# Patient Record
Sex: Female | Born: 1970 | Race: Black or African American | Hispanic: No | Marital: Single | Smoking: Current every day smoker
Health system: Southern US, Community
[De-identification: ages and names within clinical notes are randomized; demographics above are authoritative.]

---

## 2006-02-19 ENCOUNTER — Encounter (INDEPENDENT_AMBULATORY_CARE_PROVIDER_SITE_OTHER): Payer: Self-pay | Admitting: Physician Assistant

## 2006-02-20 ENCOUNTER — Encounter: Payer: Self-pay | Admitting: Physician Assistant

## 2006-02-27 ENCOUNTER — Encounter: Payer: Self-pay | Admitting: Physician Assistant

## 2006-02-28 ENCOUNTER — Encounter (INDEPENDENT_AMBULATORY_CARE_PROVIDER_SITE_OTHER): Payer: Self-pay | Admitting: Physician Assistant

## 2006-03-09 ENCOUNTER — Encounter (INDEPENDENT_AMBULATORY_CARE_PROVIDER_SITE_OTHER): Payer: Self-pay | Admitting: Physician Assistant

## 2006-03-21 ENCOUNTER — Encounter (INDEPENDENT_AMBULATORY_CARE_PROVIDER_SITE_OTHER): Payer: Self-pay | Admitting: Physician Assistant

## 2006-04-06 ENCOUNTER — Encounter: Payer: Self-pay | Admitting: Physician Assistant

## 2006-04-17 ENCOUNTER — Encounter: Payer: Self-pay | Admitting: Physician Assistant

## 2006-10-15 ENCOUNTER — Encounter (INDEPENDENT_AMBULATORY_CARE_PROVIDER_SITE_OTHER): Payer: Self-pay | Admitting: Physician Assistant

## 2006-11-21 ENCOUNTER — Encounter: Payer: Self-pay | Admitting: Physician Assistant

## 2006-11-22 ENCOUNTER — Encounter: Payer: Self-pay | Admitting: Physician Assistant

## 2007-03-27 ENCOUNTER — Encounter: Payer: Self-pay | Admitting: Physician Assistant

## 2007-04-18 ENCOUNTER — Encounter (INDEPENDENT_AMBULATORY_CARE_PROVIDER_SITE_OTHER): Payer: Self-pay | Admitting: Physician Assistant

## 2009-08-08 ENCOUNTER — Encounter (INDEPENDENT_AMBULATORY_CARE_PROVIDER_SITE_OTHER): Payer: Self-pay | Admitting: Physician Assistant

## 2010-06-13 ENCOUNTER — Ambulatory Visit: Payer: Self-pay | Admitting: Gastroenterology

## 2010-06-13 ENCOUNTER — Inpatient Hospital Stay (HOSPITAL_COMMUNITY): Admission: EM | Admit: 2010-06-13 | Discharge: 2010-06-17 | Payer: Self-pay | Admitting: Emergency Medicine

## 2010-06-16 ENCOUNTER — Encounter: Payer: Self-pay | Admitting: Gastroenterology

## 2010-07-04 ENCOUNTER — Ambulatory Visit: Payer: Self-pay | Admitting: Gastroenterology

## 2010-07-04 DIAGNOSIS — K5090 Crohn's disease, unspecified, without complications: Secondary | ICD-10-CM | POA: Insufficient documentation

## 2010-07-04 DIAGNOSIS — D509 Iron deficiency anemia, unspecified: Secondary | ICD-10-CM

## 2010-07-25 ENCOUNTER — Ambulatory Visit: Payer: Self-pay | Admitting: Gastroenterology

## 2010-09-13 NOTE — Letter (Signed)
Summary: New Patient letter  [facility]  [address] [place], [place] [address]   Phone: [phone]  Fax: [phone]       06/16/2010 MRN: [id number]  [facility] [address] [place], [place]  [address]  Dear Tara Berger,  Welcome to the Gastroenterology Division at [facility].    You are scheduled to see Dr.  Arlyce Dice  on 07/04/10 at 11:15 on the 3rd floor at [facility], [address]  We ask that you try to arrive at our office 15 minutes prior to your appointment time to allow for check-in.  We would like you to complete the enclosed self-administered evaluation form prior to your visit and bring it with you on the day of your appointment.  We will review it with you.  Also, please bring a complete list of all your medications or, if you prefer, bring the medication bottles and we will list them.  Please bring your insurance card so that we may make a copy of it.  If your insurance requires a referral to see a specialist, please bring your referral form from your primary care physician.  Co-payments are due at the time of your visit and may be paid by cash, check or credit card.     Your office visit will consist of a consult with your physician (includes a physical exam), any laboratory testing he/she may order, scheduling of any necessary diagnostic testing (e.g. x-ray, ultrasound, CT-scan), and scheduling of a procedure (e.g. Endoscopy, Colonoscopy) if required.  Please allow enough time on your schedule to allow for any/all of these possibilities.    If you cannot keep your appointment, please call [phone] to cancel or reschedule prior to your appointment date.  This allows [place] the opportunity to schedule an appointment for another patient in need of care.  If you do not cancel or reschedule by 5 p.m. the business day prior to your appointment date, you will be charged a $50.00 late cancellation/no-show fee.    Thank you for  choosing [facility] for your medical needs.  We appreciate the opportunity to care for you.  Please visit [place] at our website  to learn more about our practice.                     Sincerely,                                                             The Gastroenterology Division

## 2010-09-13 NOTE — Letter (Signed)
Summary: Epimenio Foot PA-C  Epimenio Foot PA-C   Imported By: Lester Carmichael 06/23/2010 08:40:44  _____________________________________________________________________  External Attachment:    Type:   Image     Comment:   External Document

## 2010-09-13 NOTE — Letter (Signed)
Summary: [facility]  [facility]   Imported By: Lester McCord 06/23/2010 08:37:30  _____________________________________________________________________  External Attachment:    Type:   Image     Comment:   External Document

## 2010-09-13 NOTE — Op Note (Signed)
Summary: [facility]   Imported By: Lester Cleary 06/23/2010 09:02:38  _____________________________________________________________________  External Attachment:    Type:   Image     Comment:   External Document

## 2010-09-13 NOTE — Letter (Signed)
Summary: Epimenio Foot  PA-C  Epimenio Foot  PA-C   Imported By: Lester Gueydan 06/23/2010 08:39:12  _____________________________________________________________________  External Attachment:    Type:   Image     Comment:   External Document

## 2010-09-13 NOTE — Letter (Signed)
Summary: [facility]  [facility]   Imported By: Lester [place] 06/23/2010 08:43:26  _____________________________________________________________________  External Attachment:    Type:   Image     Comment:   External Document

## 2010-09-13 NOTE — Letter (Signed)
Summary: [facility]   Imported By: Lester [place] 06/23/2010 08:34:04  _____________________________________________________________________  External Attachment:    Type:   Image     Comment:   External Document

## 2010-09-13 NOTE — Letter (Signed)
Summary: [facility]   Imported By: Lester Tich[place] 06/23/2010 08:32:41  _____________________________________________________________________  External Attachment:    Type:   Image     Comment:   External Document

## 2010-09-13 NOTE — Letter (Signed)
Summary: [facility]  [facility]   Imported By: Lester [place] 06/23/2010 08:42:08  _____________________________________________________________________  External Attachment:    Type:   Image     Comment:   External Document

## 2010-09-13 NOTE — Letter (Signed)
Summary: [facility]  [facility]   Imported By: Lester Neola 06/23/2010 08:44:52  _____________________________________________________________________  External Attachment:    Type:   Image     Comment:   External Document

## 2010-09-15 NOTE — Assessment & Plan Note (Signed)
Summary: Post hospital crohn's disease/sheri   History of Present Illness Visit Type: Initial Visit Primary GI MD: Melvia Heaps MD [facility] Primary Provider: Della Goo, MD Chief Complaint: Patient complains of bloating and constipated. She is post hospital for anemia but here to follow up on her Crohns also. She is also still having very heavy bleeding from her menstrual cycle.   History of Present Illness:   Ms. Rockhold is a 40 year old Afro-American female referred from the hospital following a hospitalization for a  UTI.  She has a history of Crohn's disease and is status post resection of her colon when  she was in [place].  She has been on steroids in the past as well as mesalamine.  Compliance apparently has been an issue.  She was hospitalized for a  UTI and abdominal pain.  She was started on prednisone and Pentasa.  She currently complains only of constipation.  She suffers from menorrhagia and has a history of an iron deficiency anemia.  Further details of her hospitalization are not known.   GI Review of Systems    Reports abdominal pain and  bloating.     Location of  Abdominal pain: generalized.    Denies acid reflux, belching, chest pain, dysphagia with liquids, dysphagia with solids, heartburn, loss of appetite, nausea, vomiting, vomiting blood, weight loss, and  weight gain.      Reports constipation.     Denies anal fissure, black tarry stools, change in bowel habit, diarrhea, diverticulosis, fecal incontinence, heme positive stool, hemorrhoids, irritable bowel syndrome, jaundice, light color stool, liver problems, rectal bleeding, and  rectal pain. Preventive Screening-Counseling & Management  Alcohol-Tobacco     Smoking Status: current    Current Medications (verified): 1)  Cyanocobalamin 1000 Mcg/ml Soln (Cyanocobalamin) .... Once A Month Injection 2)  Colace 100 Mg Caps (Docusate Sodium) .... 2 By Mouth At Bedtime 3)  Ferrous Gluconate 240 (27 Fe) Mg Tabs (Ferrous  Gluconate) .Marland Kitchen.. 1 By Mouth Three Times A Day 4)  Pentasa 250 Mg Cr-Caps (Mesalamine) .... 4 Caps By Mouth Four Times Daily 5)  Zofran 4 Mg Tabs (Ondansetron Hcl) .Marland Kitchen.. 1 By Mouth Every 6 Hours As Needed 6)  Prednisone 10 Mg Tabs (Prednisone) .... 3 Tablets By Mouth Once Daily 7)  Tylenol With Codeine #3 300-30 Mg Tabs (Acetaminophen-Codeine) .Marland Kitchen.. 1 By Mouth Every 6 Hours As Needed 8)  Ambien 5 Mg Tabs (Zolpidem Tartrate) .Marland Kitchen.. 1 By Mouth As Needed (Out)  Allergies (verified): 1)  ! * Latex  Past History:  Past Medical History: Reviewed history from 06/29/2010 and no changes required. Anemia Crohns Disease Vitamin B12 Def Hx of Marijuana use  Past Surgical History: Colon resection 2007 left tibula surgery 2008  Family History: No FH of Colon Cancer: Family History of Diabetes: father, brother  Social History: Patient currently smokes.  1/2 ppd Alcohol Use - yes socially Smoking Status:  current  Review of Systems       The patient complains of anemia, arthritis/joint pain, and fatigue.  The patient denies allergy/sinus, anxiety-new, back pain, blood in urine, breast changes/lumps, change in vision, confusion, cough, coughing up blood, depression-new, fainting, fever, headaches-new, hearing problems, heart murmur, heart rhythm changes, itching, menstrual pain, muscle pains/cramps, night sweats, nosebleeds, pregnancy symptoms, shortness of breath, skin rash, sleeping problems, sore throat, swelling of feet/legs, swollen lymph glands, thirst - excessive , urination - excessive , urination changes/pain, urine leakage, vision changes, and voice change.         All  other systems were reviewed and were negative   Vital Signs:  Patient profile:   40 year old female Height:      62 inches Weight:      138.4 pounds BMI:     25.41 Pulse rate:   88 / minute Pulse rhythm:   regular BP sitting:   130 / 82  (left arm) Cuff size:   regular  Vitals Entered By: Harlow Mares CMA Duncan Dull)  (July 04, 2010 11:21 AM)  Physical Exam  Additional Exam:  On physical exam she is a thin female  skin: anicteric HEENT: normocephalic; PEERLA; no nasal or pharyngeal abnormalities neck: supple nodes: no cervical lymphadenopathy chest: clear to ausculatation and percussion heart: no murmurs, gallops, or rubs abd: soft, nontender; BS normoactive; no abdominal masses, tenderness, organomegaly rectal: deferred ext: no cynanosis, clubbing, edema skeletal: no deformities neuro: oriented x 3; no focal abnormalities    Impression & Recommendations:  Problem # 1:  CROHN'S DISEASE (ICD-555.9) She is currently asymptomatic on prednisone and Pentasa.  Details of her Crohn's disease are not known.  Recommendations #1 taper prednisone;  continue mesalamine #2 review old records  Problem # 2:  IRON DEFICIENCY (ICD-280.9) This is most likely due to menorrhagia.  Medications #1 continue iron supplementation #2 patient is scheduled for GYN followup  Patient Instructions: 1)  Copy sent to : Della Goo, MD 2)  Prednisone Taper: 3)  Lower to 20 mg daily for 1 week 4)  then to 15mg  daily for 1 week  5)  then 10mg  weekly thereafter 6)  We will refill your Pentasa today 7)  The medication list was reviewed and reconciled.  All changed / newly prescribed medications were explained.  A complete medication list was provided to the patient / caregiver. Prescriptions: PENTASA 250 MG CR-CAPS (MESALAMINE) 4 caps by mouth four times daily  #510 x 3   Entered by:   Merri Ray CMA (AAMA)   Authorized by:   Louis Meckel MD   Signed by:   Merri Ray CMA (AAMA) on 07/04/2010   Method used:   Electronically to        [facility] [phone]* (retail)       [address]       [place], [place]  [address]       Ph: [phone]       Fax: [phone]   RxID:   [id number] PREDNISONE 10 MG TABS (PREDNISONE) As Directed  #35 x 0   Entered by:   Merri Ray CMA  (AAMA)   Authorized by:   Louis Meckel MD   Signed by:   Merri Ray CMA (AAMA) on 07/04/2010   Method used:   Electronically to        [facility] [phone]* (retail)       [address]       [place], [place]  [address]       Ph: [phone]       Fax: [phone]   RxID:   [id number]

## 2010-10-25 LAB — CBC
HCT: 22.8 % — ABNORMAL LOW (ref 36.0–46.0)
Hemoglobin: 6.1 g/dL — CL (ref 12.0–15.0)
MCH: 21.7 pg — ABNORMAL LOW (ref 26.0–34.0)
MCHC: 32 g/dL (ref 30.0–36.0)
MCV: 71.2 fL — ABNORMAL LOW (ref 78.0–100.0)
MCV: 73.1 fL — ABNORMAL LOW (ref 78.0–100.0)
Platelets: 321 10*3/uL (ref 150–400)
RBC: 2.81 MIL/uL — ABNORMAL LOW (ref 3.87–5.11)
RDW: 17.4 % — ABNORMAL HIGH (ref 11.5–15.5)

## 2010-10-25 LAB — BASIC METABOLIC PANEL
CO2: 23 mEq/L (ref 19–32)
Chloride: 109 mEq/L (ref 96–112)
Creatinine, Ser: 0.61 mg/dL (ref 0.4–1.2)
GFR calc Af Amer: >60 mL/min (ref >60)

## 2010-10-25 LAB — HEMOGLOBIN AND HEMATOCRIT, BLOOD: HCT: 23.9 % — ABNORMAL LOW (ref 36.0–46.0)

## 2010-10-25 LAB — TYPE AND SCREEN: Unit division: 0

## 2010-10-25 LAB — IRON AND TIBC: UIBC: 180 ug/dL

## 2010-10-25 LAB — FERRITIN: Ferritin: 10 ng/mL (ref 10–291)

## 2010-10-25 LAB — FOLATE: Folate: 7.8 ng/mL

## 2010-10-25 LAB — ABO/RH: ABO/RH(D): B POS

## 2010-10-25 LAB — RETICULOCYTES
RBC.: 2.91 MIL/uL — ABNORMAL LOW (ref 3.87–5.11)
Retic Ct Pct: 1.5 % (ref 0.4–3.1)

## 2010-10-26 LAB — DIFFERENTIAL
Basophils Absolute: 0 10*3/uL (ref 0.0–0.1)
Basophils Relative: 0 % (ref 0–1)
Eosinophils Absolute: 0.2 10*3/uL (ref 0.0–0.7)
Eosinophils Relative: 3 % (ref 0–5)
Lymphs Abs: 1.6 10*3/uL (ref 0.7–4.0)
Neutrophils Relative %: 67 % (ref 43–77)

## 2010-10-26 LAB — CBC
HCT: 23.7 % — ABNORMAL LOW (ref 36.0–46.0)
Hemoglobin: 7.3 g/dL — ABNORMAL LOW (ref 12.0–15.0)
MCH: 22.1 pg — ABNORMAL LOW (ref 26.0–34.0)
MCHC: 30.8 g/dL (ref 30.0–36.0)
RBC: 3.31 MIL/uL — ABNORMAL LOW (ref 3.87–5.11)

## 2010-10-26 LAB — URINALYSIS, ROUTINE W REFLEX MICROSCOPIC
Nitrite: NEGATIVE
Protein, ur: NEGATIVE mg/dL
Specific Gravity, Urine: 1.012 (ref 1.005–1.030)
Urobilinogen, UA: 1 mg/dL (ref 0.0–1.0)

## 2010-10-26 LAB — CULTURE, BLOOD (ROUTINE X 2)
Culture  Setup Time: 201111010355
Culture  Setup Time: 201111010355

## 2010-10-26 LAB — COMPREHENSIVE METABOLIC PANEL
ALT: <8 U/L (ref 0–35)
CO2: 23 mEq/L (ref 19–32)
Calcium: 8.7 mg/dL (ref 8.4–10.5)
Chloride: 103 mEq/L (ref 96–112)
GFR calc non Af Amer: >60 mL/min (ref >60)
Glucose, Bld: 94 mg/dL (ref 70–99)
Sodium: 133 mEq/L — ABNORMAL LOW (ref 135–145)
Total Bilirubin: 0.4 mg/dL (ref 0.3–1.2)

## 2010-10-26 LAB — LIPASE, BLOOD: Lipase: 22 U/L (ref 11–59)

## 2010-10-26 LAB — URINE MICROSCOPIC-ADD ON

## 2010-10-26 LAB — URINE CULTURE
Colony Count: >=100000
Culture  Setup Time: 201111010023

## 2011-08-10 IMAGING — CR DG ABDOMEN ACUTE W/ 1V CHEST
3 series · 3 of 3 positions shown · non-contrast
Comparison: None

CLINICAL DATA: Abdominal pain.  Crohns disease with colon resection
0228.

ACUTE ABDOMEN SERIES (ABDOMEN 2 VIEW & CHEST 1 VIEW)

[w chest pa]
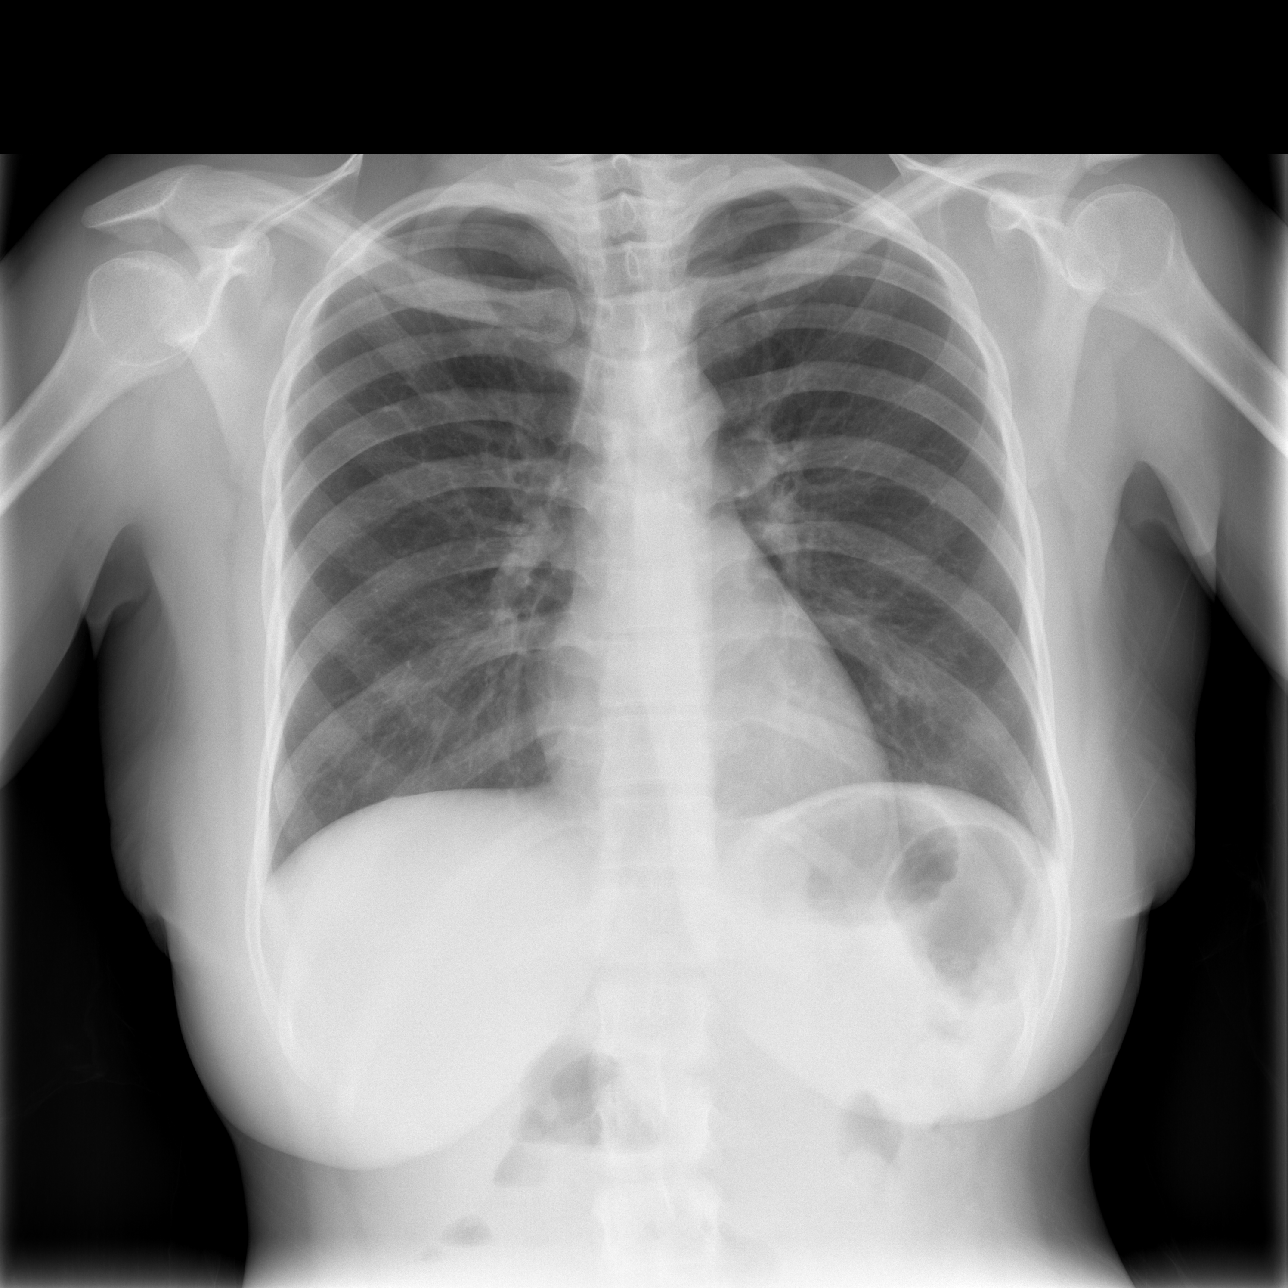

[w abdomen upright]
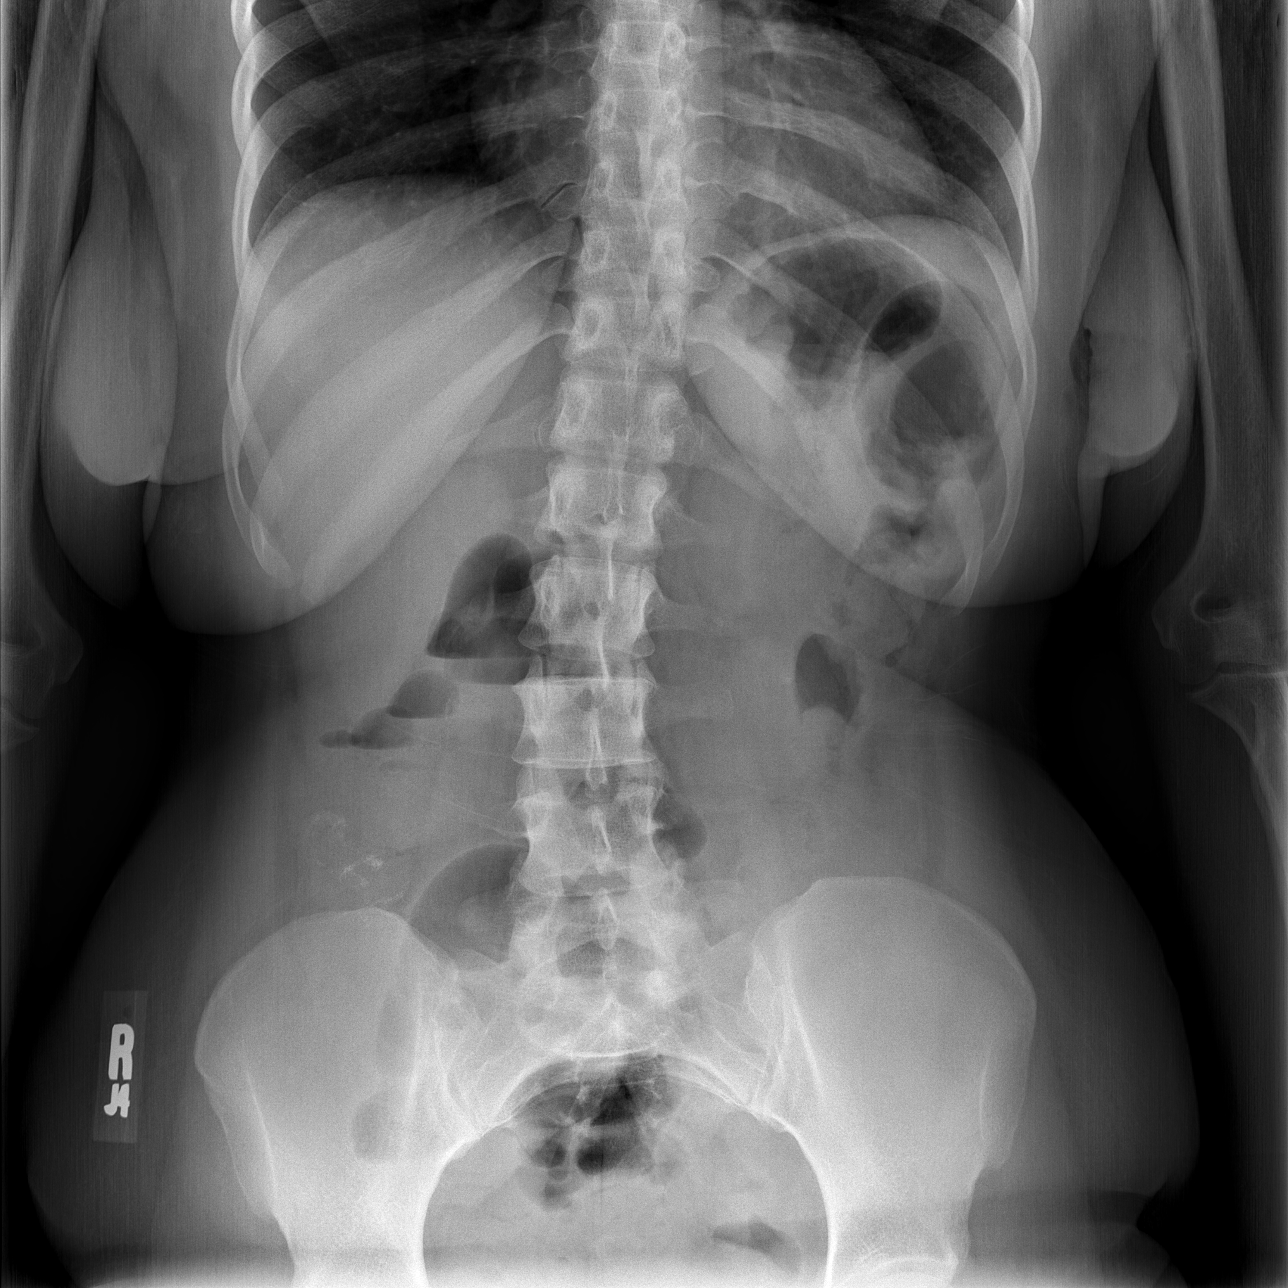

[t abdomen supine]
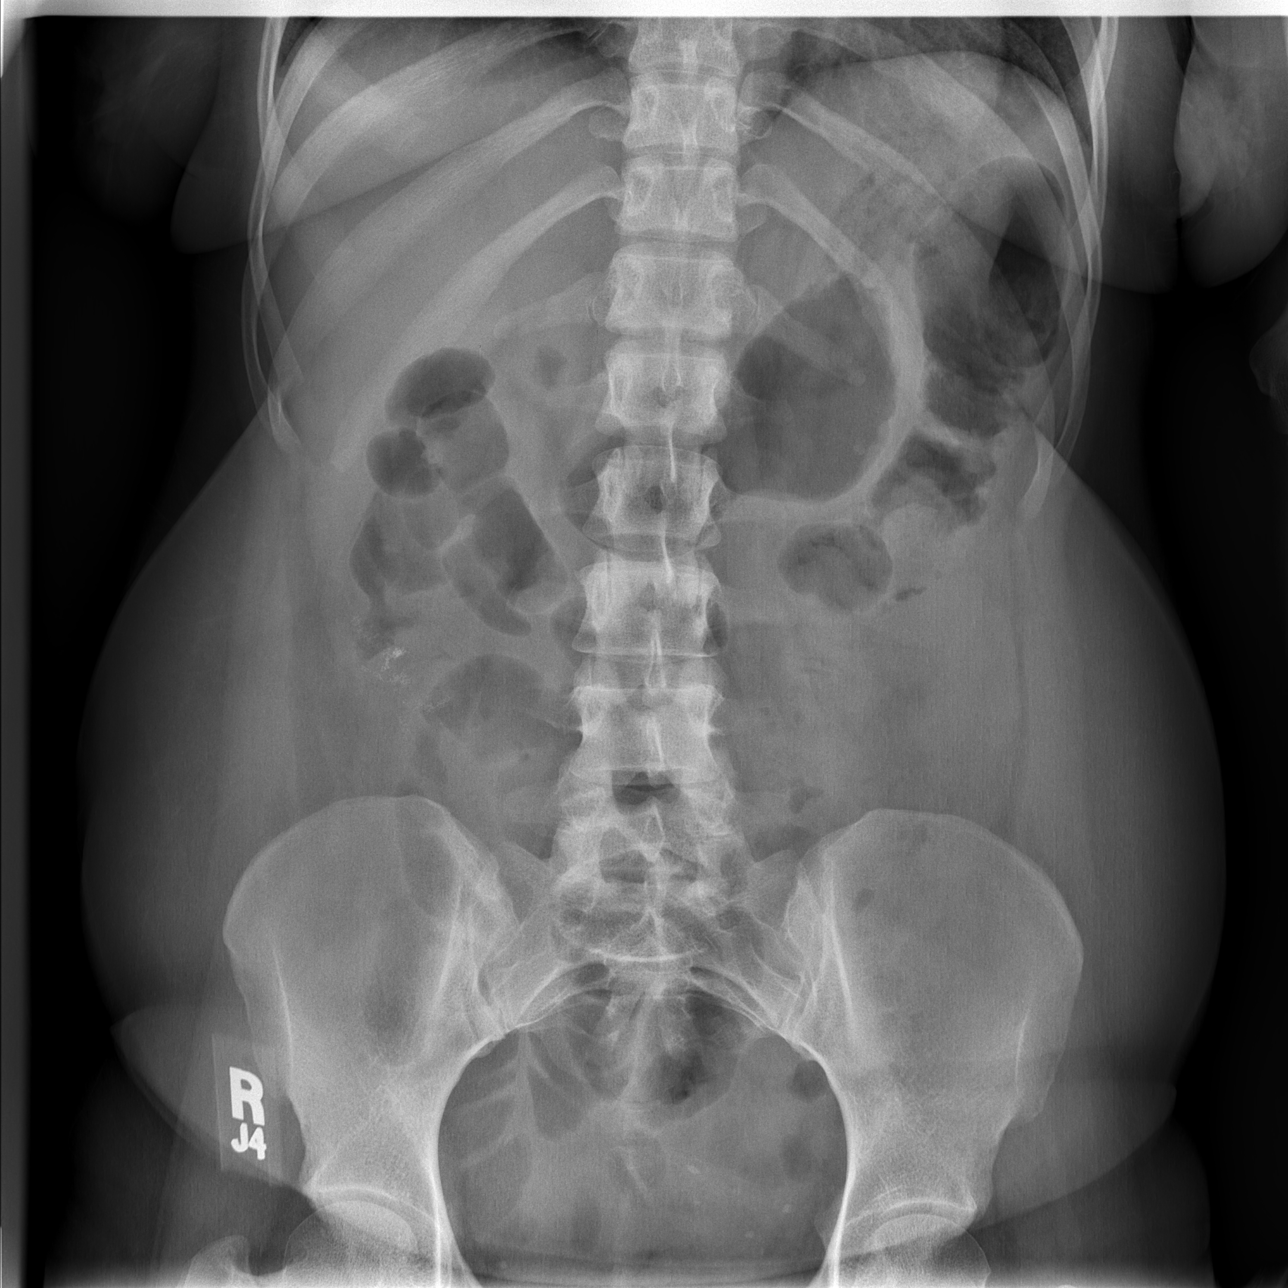

[3 of 3 positions shown; findings below may reference images not displayed]

FINDINGS: Heart size is normal and the vascularity is normal.
Lungs are clear without infiltrate or effusion.

Surgical clips are present in the right abdomen from prior bowel
surgery.  Negative for bowel obstruction.  Air fluid levels are
present in the right colon without bowel thickening.  Negative for
free intraperitoneal air.  No renal calculi or bony abnormality.
IMPRESSION: No acute abnormality the chest.

Air-fluid levels in the right colon without bowel dilatation.

## 2018-11-27 DIAGNOSIS — F411 Generalized anxiety disorder: Secondary | ICD-10-CM | POA: Insufficient documentation

## 2018-11-27 DIAGNOSIS — F17200 Nicotine dependence, unspecified, uncomplicated: Secondary | ICD-10-CM | POA: Diagnosis present

## 2021-01-04 DIAGNOSIS — I10 Essential (primary) hypertension: Secondary | ICD-10-CM | POA: Diagnosis present

## 2022-07-25 DIAGNOSIS — I050 Rheumatic mitral stenosis: Secondary | ICD-10-CM | POA: Insufficient documentation

## 2022-07-26 DIAGNOSIS — K50819 Crohn's disease of both small and large intestine with unspecified complications: Secondary | ICD-10-CM | POA: Insufficient documentation

## 2022-07-26 DIAGNOSIS — G8194 Hemiplegia, unspecified affecting left nondominant side: Secondary | ICD-10-CM | POA: Diagnosis present

## 2022-10-30 DIAGNOSIS — I6340 Cerebral infarction due to embolism of unspecified cerebral artery: Secondary | ICD-10-CM | POA: Insufficient documentation

## 2022-10-30 DIAGNOSIS — Z7409 Other reduced mobility: Secondary | ICD-10-CM | POA: Insufficient documentation

## 2022-11-11 DIAGNOSIS — E162 Hypoglycemia, unspecified: Secondary | ICD-10-CM | POA: Insufficient documentation

## 2022-11-11 DIAGNOSIS — N3000 Acute cystitis without hematuria: Secondary | ICD-10-CM | POA: Insufficient documentation

## 2022-11-11 DIAGNOSIS — I824Y2 Acute embolism and thrombosis of unspecified deep veins of left proximal lower extremity: Secondary | ICD-10-CM | POA: Insufficient documentation

## 2022-11-11 DIAGNOSIS — Z86711 Personal history of pulmonary embolism: Secondary | ICD-10-CM | POA: Diagnosis present

## 2022-11-11 DIAGNOSIS — I2699 Other pulmonary embolism without acute cor pulmonale: Secondary | ICD-10-CM | POA: Diagnosis present

## 2022-12-08 DIAGNOSIS — N764 Abscess of vulva: Secondary | ICD-10-CM | POA: Insufficient documentation

## 2022-12-08 DIAGNOSIS — D696 Thrombocytopenia, unspecified: Secondary | ICD-10-CM | POA: Insufficient documentation

## 2022-12-08 DIAGNOSIS — J159 Unspecified bacterial pneumonia: Secondary | ICD-10-CM | POA: Insufficient documentation

## 2022-12-08 DIAGNOSIS — B9620 Unspecified Escherichia coli [E. coli] as the cause of diseases classified elsewhere: Secondary | ICD-10-CM | POA: Insufficient documentation

## 2022-12-08 DIAGNOSIS — S0230XA Fracture of orbital floor, unspecified side, initial encounter for closed fracture: Secondary | ICD-10-CM | POA: Insufficient documentation

## 2022-12-08 DIAGNOSIS — F17210 Nicotine dependence, cigarettes, uncomplicated: Secondary | ICD-10-CM | POA: Insufficient documentation

## 2022-12-08 DIAGNOSIS — A4151 Sepsis due to Escherichia coli [E. coli]: Secondary | ICD-10-CM | POA: Insufficient documentation

## 2022-12-08 DIAGNOSIS — E861 Hypovolemia: Secondary | ICD-10-CM | POA: Insufficient documentation

## 2022-12-08 DIAGNOSIS — G4700 Insomnia, unspecified: Secondary | ICD-10-CM | POA: Insufficient documentation

## 2022-12-08 DIAGNOSIS — N390 Urinary tract infection, site not specified: Secondary | ICD-10-CM | POA: Insufficient documentation

## 2023-01-24 ENCOUNTER — Inpatient Hospital Stay (HOSPITAL_COMMUNITY)
Admission: EM | Admit: 2023-01-24 | Discharge: 2023-02-07 | DRG: 329 | Disposition: A | Discharge status: Home Health | Payer: Medicaid Other | Attending: Student in an Organized Health Care Education/Training Program | Admitting: Student in an Organized Health Care Education/Training Program

## 2023-01-24 ENCOUNTER — Encounter (HOSPITAL_COMMUNITY): Payer: Self-pay

## 2023-01-24 ENCOUNTER — Inpatient Hospital Stay (HOSPITAL_COMMUNITY): Payer: Medicaid Other

## 2023-01-24 ENCOUNTER — Other Ambulatory Visit: Payer: Self-pay

## 2023-01-24 ENCOUNTER — Emergency Department (HOSPITAL_COMMUNITY): Payer: Medicaid Other

## 2023-01-24 DIAGNOSIS — K56609 Unspecified intestinal obstruction, unspecified as to partial versus complete obstruction: Secondary | ICD-10-CM | POA: Diagnosis not present

## 2023-01-24 DIAGNOSIS — Z8701 Personal history of pneumonia (recurrent): Secondary | ICD-10-CM

## 2023-01-24 DIAGNOSIS — F17210 Nicotine dependence, cigarettes, uncomplicated: Secondary | ICD-10-CM | POA: Diagnosis present

## 2023-01-24 DIAGNOSIS — E162 Hypoglycemia, unspecified: Secondary | ICD-10-CM | POA: Diagnosis not present

## 2023-01-24 DIAGNOSIS — K9189 Other postprocedural complications and disorders of digestive system: Principal | ICD-10-CM | POA: Diagnosis present

## 2023-01-24 DIAGNOSIS — G8194 Hemiplegia, unspecified affecting left nondominant side: Secondary | ICD-10-CM | POA: Diagnosis present

## 2023-01-24 DIAGNOSIS — Z7901 Long term (current) use of anticoagulants: Secondary | ICD-10-CM | POA: Diagnosis not present

## 2023-01-24 DIAGNOSIS — Z91199 Patient's noncompliance with other medical treatment and regimen due to unspecified reason: Secondary | ICD-10-CM | POA: Diagnosis not present

## 2023-01-24 DIAGNOSIS — R6510 Systemic inflammatory response syndrome (SIRS) of non-infectious origin without acute organ dysfunction: Secondary | ICD-10-CM | POA: Diagnosis not present

## 2023-01-24 DIAGNOSIS — T45516A Underdosing of anticoagulants, initial encounter: Secondary | ICD-10-CM | POA: Diagnosis present

## 2023-01-24 DIAGNOSIS — K565 Intestinal adhesions [bands], unspecified as to partial versus complete obstruction: Secondary | ICD-10-CM | POA: Diagnosis not present

## 2023-01-24 DIAGNOSIS — K660 Peritoneal adhesions (postprocedural) (postinfection): Secondary | ICD-10-CM | POA: Diagnosis present

## 2023-01-24 DIAGNOSIS — I10 Essential (primary) hypertension: Secondary | ICD-10-CM | POA: Diagnosis present

## 2023-01-24 DIAGNOSIS — E876 Hypokalemia: Secondary | ICD-10-CM | POA: Diagnosis present

## 2023-01-24 DIAGNOSIS — E860 Dehydration: Secondary | ICD-10-CM | POA: Diagnosis present

## 2023-01-24 DIAGNOSIS — K5090 Crohn's disease, unspecified, without complications: Secondary | ICD-10-CM | POA: Diagnosis present

## 2023-01-24 DIAGNOSIS — K9132 Postprocedural complete intestinal obstruction: Secondary | ICD-10-CM | POA: Diagnosis present

## 2023-01-24 DIAGNOSIS — E440 Moderate protein-calorie malnutrition: Secondary | ICD-10-CM | POA: Diagnosis present

## 2023-01-24 DIAGNOSIS — D649 Anemia, unspecified: Secondary | ICD-10-CM | POA: Diagnosis not present

## 2023-01-24 DIAGNOSIS — D62 Acute posthemorrhagic anemia: Secondary | ICD-10-CM | POA: Diagnosis not present

## 2023-01-24 DIAGNOSIS — Z86711 Personal history of pulmonary embolism: Secondary | ICD-10-CM

## 2023-01-24 DIAGNOSIS — I050 Rheumatic mitral stenosis: Secondary | ICD-10-CM | POA: Diagnosis present

## 2023-01-24 DIAGNOSIS — I2699 Other pulmonary embolism without acute cor pulmonale: Secondary | ICD-10-CM | POA: Diagnosis present

## 2023-01-24 DIAGNOSIS — Z6824 Body mass index (BMI) 24.0-24.9, adult: Secondary | ICD-10-CM

## 2023-01-24 DIAGNOSIS — D696 Thrombocytopenia, unspecified: Secondary | ICD-10-CM | POA: Diagnosis present

## 2023-01-24 DIAGNOSIS — Y832 Surgical operation with anastomosis, bypass or graft as the cause of abnormal reaction of the patient, or of later complication, without mention of misadventure at the time of the procedure: Secondary | ICD-10-CM | POA: Diagnosis present

## 2023-01-24 DIAGNOSIS — Z8673 Personal history of transient ischemic attack (TIA), and cerebral infarction without residual deficits: Secondary | ICD-10-CM

## 2023-01-24 DIAGNOSIS — F411 Generalized anxiety disorder: Secondary | ICD-10-CM | POA: Diagnosis present

## 2023-01-24 DIAGNOSIS — F17200 Nicotine dependence, unspecified, uncomplicated: Secondary | ICD-10-CM | POA: Diagnosis present

## 2023-01-24 DIAGNOSIS — Z86718 Personal history of other venous thrombosis and embolism: Secondary | ICD-10-CM

## 2023-01-24 DIAGNOSIS — Z7982 Long term (current) use of aspirin: Secondary | ICD-10-CM

## 2023-01-24 DIAGNOSIS — I69354 Hemiplegia and hemiparesis following cerebral infarction affecting left non-dominant side: Secondary | ICD-10-CM

## 2023-01-24 DIAGNOSIS — Z91040 Latex allergy status: Secondary | ICD-10-CM

## 2023-01-24 DIAGNOSIS — Z79899 Other long term (current) drug therapy: Secondary | ICD-10-CM | POA: Diagnosis not present

## 2023-01-24 DIAGNOSIS — K567 Ileus, unspecified: Secondary | ICD-10-CM | POA: Diagnosis not present

## 2023-01-24 DIAGNOSIS — Z9049 Acquired absence of other specified parts of digestive tract: Secondary | ICD-10-CM

## 2023-01-24 LAB — COMPREHENSIVE METABOLIC PANEL
ALT: 29 U/L (ref 0–44)
AST: 40 U/L (ref 15–41)
Albumin: 2.9 g/dL — ABNORMAL LOW (ref 3.5–5.0)
Alkaline Phosphatase: 101 U/L (ref 38–126)
Anion gap: 13 (ref 5–15)
BUN: 18 mg/dL (ref 6–20)
CO2: 25 mmol/L (ref 22–32)
Calcium: 8 mg/dL — ABNORMAL LOW (ref 8.9–10.3)
Chloride: 99 mmol/L (ref 98–111)
Creatinine, Ser: 0.86 mg/dL (ref 0.44–1.00)
GFR, Estimated: >60 mL/min (ref >60)
Glucose, Bld: 102 mg/dL — ABNORMAL HIGH (ref 70–99)
Potassium: 3.3 mmol/L — ABNORMAL LOW (ref 3.5–5.1)
Sodium: 137 mmol/L (ref 135–145)
Total Bilirubin: 1.2 mg/dL (ref 0.3–1.2)
Total Protein: 6.7 g/dL (ref 6.5–8.1)

## 2023-01-24 LAB — CBC WITH DIFFERENTIAL/PLATELET
Abs Immature Granulocytes: 0.02 10*3/uL (ref 0.00–0.07)
Basophils Absolute: 0 10*3/uL (ref 0.0–0.1)
Basophils Relative: 0 %
Eosinophils Absolute: 0 10*3/uL (ref 0.0–0.5)
Eosinophils Relative: 1 %
HCT: 34.9 % — ABNORMAL LOW (ref 36.0–46.0)
Hemoglobin: 11.1 g/dL — ABNORMAL LOW (ref 12.0–15.0)
Immature Granulocytes: 0 %
Lymphocytes Relative: 15 %
Lymphs Abs: 1.3 10*3/uL (ref 0.7–4.0)
MCH: 28.2 pg (ref 26.0–34.0)
MCHC: 31.8 g/dL (ref 30.0–36.0)
MCV: 88.8 fL (ref 80.0–100.0)
Monocytes Absolute: 0.5 10*3/uL (ref 0.1–1.0)
Monocytes Relative: 6 %
Neutro Abs: 6.9 10*3/uL (ref 1.7–7.7)
Neutrophils Relative %: 78 %
Platelets: 270 10*3/uL (ref 150–400)
RBC: 3.93 MIL/uL (ref 3.87–5.11)
RDW: 14.9 % (ref 11.5–15.5)
WBC: 8.8 10*3/uL (ref 4.0–10.5)
nRBC: 0 % (ref 0.0–0.2)

## 2023-01-24 LAB — LACTIC ACID, PLASMA: Lactic Acid, Venous: 0.9 mmol/L (ref 0.5–1.9)

## 2023-01-24 LAB — LIPASE, BLOOD: Lipase: 22 U/L (ref 11–51)

## 2023-01-24 LAB — PHOSPHORUS: Phosphorus: 4.7 mg/dL — ABNORMAL HIGH (ref 2.5–4.6)

## 2023-01-24 LAB — MAGNESIUM: Magnesium: 1.6 mg/dL — ABNORMAL LOW (ref 1.7–2.4)

## 2023-01-24 MED ORDER — POTASSIUM CHLORIDE 10 MEQ/100ML IV SOLN
10.0000 meq | INTRAVENOUS | Status: AC
  Administered 2023-01-24 – 2023-01-25 (×5): 10 meq via INTRAVENOUS
  Filled 2023-01-24 (×4): qty 100

## 2023-01-24 MED ORDER — ENOXAPARIN SODIUM 60 MG/0.6ML IJ SOSY
1.0000 mg/kg | PREFILLED_SYRINGE | Freq: Two times a day (BID) | INTRAMUSCULAR | Status: DC
  Administered 2023-01-24 – 2023-01-28 (×9): 60 mg via SUBCUTANEOUS
  Filled 2023-01-24 (×9): qty 0.6

## 2023-01-24 MED ORDER — MORPHINE SULFATE (PF) 4 MG/ML IV SOLN
4.0000 mg | Freq: Once | INTRAVENOUS | Status: AC
  Administered 2023-01-24: 4 mg via INTRAVENOUS
  Filled 2023-01-24: qty 1

## 2023-01-24 MED ORDER — SODIUM CHLORIDE 0.9 % IV BOLUS
1000.0000 mL | Freq: Once | INTRAVENOUS | Status: AC
  Administered 2023-01-24: 1000 mL via INTRAVENOUS

## 2023-01-24 MED ORDER — IOHEXOL 9 MG/ML PO SOLN
500.0000 mL | ORAL | Status: AC
  Administered 2023-01-24 (×2): 500 mL via ORAL

## 2023-01-24 MED ORDER — MORPHINE SULFATE (PF) 4 MG/ML IV SOLN
4.0000 mg | INTRAVENOUS | Status: DC | PRN
  Administered 2023-01-25 – 2023-01-26 (×7): 4 mg via INTRAVENOUS
  Filled 2023-01-24 (×7): qty 1

## 2023-01-24 MED ORDER — PANTOPRAZOLE SODIUM 40 MG IV SOLR
40.0000 mg | Freq: Every day | INTRAVENOUS | Status: DC
  Administered 2023-01-24 – 2023-02-05 (×13): 40 mg via INTRAVENOUS
  Filled 2023-01-24 (×14): qty 10

## 2023-01-24 MED ORDER — POTASSIUM CHLORIDE IN NACL 20-0.9 MEQ/L-% IV SOLN
INTRAVENOUS | Status: DC
  Filled 2023-01-24 (×3): qty 1000

## 2023-01-24 MED ORDER — PROCHLORPERAZINE EDISYLATE 10 MG/2ML IJ SOLN: 10.0000 mg | Freq: Four times a day (QID) | INTRAMUSCULAR | Status: DC | PRN

## 2023-01-24 MED ORDER — ACETAMINOPHEN 325 MG PO TABS: 650.0000 mg | ORAL_TABLET | Freq: Four times a day (QID) | ORAL | Status: DC | PRN

## 2023-01-24 MED ORDER — ONDANSETRON HCL 4 MG/2ML IJ SOLN
4.0000 mg | Freq: Once | INTRAMUSCULAR | Status: AC
  Administered 2023-01-24: 4 mg via INTRAVENOUS
  Filled 2023-01-24: qty 2

## 2023-01-24 MED ORDER — NICOTINE 14 MG/24HR TD PT24
14.0000 mg | MEDICATED_PATCH | Freq: Every day | TRANSDERMAL | Status: DC
  Administered 2023-01-24 – 2023-02-07 (×15): 14 mg via TRANSDERMAL
  Filled 2023-01-24 (×15): qty 1

## 2023-01-24 MED ORDER — ACETAMINOPHEN 650 MG RE SUPP: 650.0000 mg | Freq: Four times a day (QID) | RECTAL | Status: DC | PRN

## 2023-01-24 MED ORDER — IOHEXOL 9 MG/ML PO SOLN
ORAL | Status: AC
  Filled 2023-01-24: qty 1000

## 2023-01-24 MED ORDER — MAGNESIUM SULFATE 2 GM/50ML IV SOLN
2.0000 g | Freq: Once | INTRAVENOUS | Status: AC
  Administered 2023-01-24: 2 g via INTRAVENOUS
  Filled 2023-01-24: qty 50

## 2023-01-24 NOTE — ED Notes (Signed)
Pt to xray via stretcher

## 2023-01-24 NOTE — ED Notes (Signed)
ED TO INPATIENT HANDOFF REPORT  ED Nurse Name and Phone #: Thamas Jaegers Name/Age/Gender Tara Berger 52 y.o. female Room/Bed: WA11/WA11  Code Status   Code Status: Full Code  Home/SNF/Other Home Patient oriented to: self, place, time, and situation Is this baseline? Yes   Triage Complete: Triage complete  Chief Complaint SBO (small bowel obstruction) (HCC) [K56.609]  Triage Note Pt coming in today saying her chrons disease is flaring up. Pt also states she was seen recently at the hospital and has a GI obstruction.    Allergies Allergies  Allergen Reactions   Latex     Level of Care/Admitting Diagnosis ED Disposition     ED Disposition  Admit   Condition  --   Comment  Hospital Area: [facility] [[id number]]  Level of Care: Med-Surg [16]  May admit patient to [facility] or [facility] if equivalent level of care is available:: No  Covid Evaluation: Asymptomatic - no recent exposure (last 10 days) testing not required  Diagnosis: SBO (small bowel obstruction) [facility]) [[id number]]  Admitting Physician: Bobette Mo [[id number]]  Attending Physician: Bobette Mo [[id number]]  Certification:: I certify this patient will need inpatient services for at least 2 midnights  Estimated Length of Stay: 2          B Medical/Surgery History History reviewed. No pertinent past medical history. History reviewed. No pertinent surgical history.   A IV Location/Drains/Wounds Patient Lines/Drains/Airways Status     Active Line/Drains/Airways     Name Placement date Placement time Site Days   Peripheral IV 01/24/23 22 G Right Antecubital 01/24/23  1230  Antecubital  less than 1            Intake/Output Last 24 hours  Intake/Output Summary (Last 24 hours) at 01/24/2023 1459 Last data filed at 01/24/2023 1417 Gross per 24 hour  Intake 1000 ml  Output --  Net 1000 ml    Labs/Imaging Results for orders placed or performed during the hospital  encounter of 01/24/23 (from the past 48 hour(s))  CBC with Differential     Status: Abnormal   Collection Time: 01/24/23 12:40 PM  Result Value Ref Range   WBC 8.8 4.0 - 10.5 K/uL   RBC 3.93 3.87 - 5.11 MIL/uL   Hemoglobin 11.1 (L) 12.0 - 15.0 g/dL   HCT 29.5 (L) 62.1 - 30.8 %   MCV 88.8 80.0 - 100.0 fL   MCH 28.2 26.0 - 34.0 pg   MCHC 31.8 30.0 - 36.0 g/dL   RDW 65.7 84.6 - 96.2 %   Platelets 270 150 - 400 K/uL   nRBC 0.0 0.0 - 0.2 %   Neutrophils Relative % 78 %   Neutro Abs 6.9 1.7 - 7.7 K/uL   Lymphocytes Relative 15 %   Lymphs Abs 1.3 0.7 - 4.0 K/uL   Monocytes Relative 6 %   Monocytes Absolute 0.5 0.1 - 1.0 K/uL   Eosinophils Relative 1 %   Eosinophils Absolute 0.0 0.0 - 0.5 K/uL   Basophils Relative 0 %   Basophils Absolute 0.0 0.0 - 0.1 K/uL   Immature Granulocytes 0 %   Abs Immature Granulocytes 0.02 0.00 - 0.07 K/uL    Comment: Performed at [facility], [address], [place], [place] [address]  Comprehensive metabolic panel     Status: Abnormal   Collection Time: 01/24/23 12:40 PM  Result Value Ref Range   Sodium 137 135 - 145 mmol/L   Potassium 3.3 (L)  3.5 - 5.1 mmol/L   Chloride 99 98 - 111 mmol/L   CO2 25 22 - 32 mmol/L   Glucose, Bld 102 (H) 70 - 99 mg/dL    Comment: Glucose reference range applies only to samples taken after fasting for at least 8 hours.   BUN 18 6 - 20 mg/dL   Creatinine, Ser 6.04 0.44 - 1.00 mg/dL   Calcium 8.0 (L) 8.9 - 10.3 mg/dL   Total Protein 6.7 6.5 - 8.1 g/dL   Albumin 2.9 (L) 3.5 - 5.0 g/dL   AST 40 15 - 41 U/L   ALT 29 0 - 44 U/L   Alkaline Phosphatase 101 38 - 126 U/L   Total Bilirubin 1.2 0.3 - 1.2 mg/dL   GFR, Estimated >54 >09 mL/min    Comment: (NOTE) Calculated using the CKD-EPI Creatinine Equation (2021)    Anion gap 13 5 - 15    Comment: Performed at [facility], [address], [place], [place] [address]  Lipase, blood     Status: None   Collection Time: 01/24/23 12:40 PM   Result Value Ref Range   Lipase 22 11 - 51 U/L    Comment: Performed at [facility], [address] [place], [place] [address]   DG ABD ACUTE 2+V W 1V CHEST  Result Date: 01/24/2023 CLINICAL DATA:  Crohn disease, recent obstruction. Abdominal pain and vomiting. EXAM: DG ABDOMEN ACUTE WITH 1 VIEW CHEST COMPARISON:  06/13/2010. FINDINGS: Frontal view of the chest shows midline trachea and normal heart size. Pleuroparenchymal scarring in the left hemithorax, new from 06/13/2010. Lungs are otherwise clear. Markedly distended loops of small bowel in the upper abdomen with associated air-fluid levels. No colonic gas. Sutures are seen in the right lower quadrant. IVC filter in place. No free air. IMPRESSION: 1. High-grade small bowel obstruction.  No free air. 2. Pleuroparenchymal scarring in the left hemithorax. Electronically Signed   By: Leanna Battles M.D.   On: 01/24/2023 13:01    Pending Labs Unresulted Labs (From admission, onward)     Start     Ordered   01/25/23 0500  HIV Antibody (routine testing w rflx)  (HIV Antibody (Routine testing w reflex) panel)  Tomorrow morning,   R        01/24/23 1446   01/25/23 0500  Magnesium  Tomorrow morning,   R        01/24/23 1446   01/25/23 0500  CBC  Tomorrow morning,   R        01/24/23 1446   01/25/23 0500  Comprehensive metabolic panel  Tomorrow morning,   R        01/24/23 1446   01/24/23 1444  Magnesium  Add-on,   AD        01/24/23 1446   01/24/23 1444  Phosphorus  Add-on,   AD        01/24/23 1446   01/24/23 1416  Lactic acid, plasma  Now then every 2 hours,   R      01/24/23 1416   01/24/23 1416  Blood culture (routine x 2)  BLOOD CULTURE X 2,   R      01/24/23 1416   01/24/23 1136  Urinalysis, Routine w reflex microscopic -Urine, Clean Catch  (ED Abdominal Pain)  Once,   URGENT       Question:  Specimen Source  Answer:  Urine, Clean Catch   01/24/23 1135  Vitals/Pain Today's Vitals   01/24/23  1307 01/24/23 1335 01/24/23 1400 01/24/23 1430  BP:  (!) 89/54 (!) 81/53 122/87  Pulse:  98 97 91  Resp:  13 15 20   Temp:      TempSrc:      SpO2:  99% 99% 99%  Weight:      Height:      PainSc: 4        Isolation Precautions No active isolations  Medications Medications  0.9 % NaCl with KCl 20 mEq/ L  infusion (has no administration in time range)  potassium chloride 10 mEq in 100 mL IVPB (has no administration in time range)  acetaminophen (TYLENOL) tablet 650 mg (has no administration in time range)    Or  acetaminophen (TYLENOL) suppository 650 mg (has no administration in time range)  prochlorperazine (COMPAZINE) injection 10 mg (has no administration in time range)  pantoprazole (PROTONIX) injection 40 mg (has no administration in time range)  morphine (PF) 4 MG/ML injection 4 mg (4 mg Intravenous Given 01/24/23 1238)  sodium chloride 0.9 % bolus 1,000 mL (0 mLs Intravenous Stopped 01/24/23 1417)  ondansetron (ZOFRAN) injection 4 mg (4 mg Intravenous Given 01/24/23 1238)  sodium chloride 0.9 % bolus 1,000 mL (1,000 mLs Intravenous New Bag/Given 01/24/23 1430)    Mobility walks with device     Focused Assessments    R Recommendations: See Admitting Provider Note  Report given to:   Additional Notes:

## 2023-01-24 NOTE — ED Provider Notes (Signed)
[place] EMERGENCY DEPARTMENT AT [facility] Provider Note   CSN: [id number] Arrival date & time: 01/24/23  1103     History  Chief Complaint  Patient presents with   Abdominal Pain    Tara Berger is a 52 y.o. female.  The history is provided by the patient and medical records. No language interpreter was used.  Abdominal Pain    52 year old female significant history of Crohn's disease presenting with complaint of abdominal pain.  Patient states she has a history of small bowel obstruction secondary to Crohn's disease last hospitalized a little over a month ago.  She states she did not have any surgery at that time because she was too emaciated.  She "checked myself out ".  She is endorsing having diffuse abdominal cramping ongoing for nearly 2 weeks with associated nausea and vomiting and decreased bowel movement.  She is not eating much but states she is able to drink fluid until yesterday.  She cannot tolerate fluid.  She feels very dehydrated.  She thinks she may have another SBO.  She is currently on steroid for her Crohn's disease.  She denies any urinary symptoms denies fever chills chest pain or shortness of breath.  Home Medications Prior to Admission medications   Not on File      Allergies    Latex    Review of Systems   Review of Systems  Gastrointestinal:  Positive for abdominal pain.  All other systems reviewed and are negative.   Physical Exam Updated Vital Signs BP (!) 113/93 (BP Location: Left Arm)   Pulse (!) 165   Temp 98.2 F (36.8 C) (Oral)   Resp 17   Ht 5\' 2"  (1.575 m)   Wt 59.9 kg   SpO2 100%   BMI 24.14 kg/m  Physical Exam Vitals and nursing note reviewed.  Constitutional:      General: She is not in acute distress.    Appearance: She is well-developed.     Comments: Frail-appearing female laying in bed appears to be in no acute discomfort.  HENT:     Head: Atraumatic.  Eyes:     Conjunctiva/sclera: Conjunctivae  normal.  Cardiovascular:     Rate and Rhythm: Tachycardia present.     Heart sounds: Normal heart sounds.  Pulmonary:     Effort: Pulmonary effort is normal.     Breath sounds: No wheezing, rhonchi or rales.  Abdominal:     General: Abdomen is flat. Bowel sounds are decreased.     Palpations: Abdomen is soft.     Tenderness: There is generalized abdominal tenderness.     Hernia: No hernia is present.  Musculoskeletal:     Cervical back: Neck supple.  Skin:    Findings: No rash.  Neurological:     Mental Status: She is alert.  Psychiatric:        Mood and Affect: Mood normal.     ED Results / Procedures / Treatments   Labs (all labs ordered are listed, but only abnormal results are displayed) Labs Reviewed  CBC WITH DIFFERENTIAL/PLATELET - Abnormal; Notable for the following components:      Result Value   Hemoglobin 11.1 (*)    HCT 34.9 (*)    All other components within normal limits  COMPREHENSIVE METABOLIC PANEL - Abnormal; Notable for the following components:   Potassium 3.3 (*)    Glucose, Bld 102 (*)    Calcium 8.0 (*)    Albumin 2.9 (*)  All other components within normal limits  CULTURE, BLOOD (ROUTINE X 2)  CULTURE, BLOOD (ROUTINE X 2)  LIPASE, BLOOD  URINALYSIS, ROUTINE W REFLEX MICROSCOPIC  LACTIC ACID, PLASMA  LACTIC ACID, PLASMA    EKG None  Radiology DG ABD ACUTE 2+V W 1V CHEST  Result Date: 01/24/2023 CLINICAL DATA:  Crohn disease, recent obstruction. Abdominal pain and vomiting. EXAM: DG ABDOMEN ACUTE WITH 1 VIEW CHEST COMPARISON:  06/13/2010. FINDINGS: Frontal view of the chest shows midline trachea and normal heart size. Pleuroparenchymal scarring in the left hemithorax, new from 06/13/2010. Lungs are otherwise clear. Markedly distended loops of small bowel in the upper abdomen with associated air-fluid levels. No colonic gas. Sutures are seen in the right lower quadrant. IVC filter in place. No free air. IMPRESSION: 1. High-grade small bowel  obstruction.  No free air. 2. Pleuroparenchymal scarring in the left hemithorax. Electronically Signed   By: Leanna Battles M.D.   On: 01/24/2023 13:01    Procedures Procedures    Medications Ordered in ED Medications  morphine (PF) 4 MG/ML injection 4 mg (4 mg Intravenous Given 01/24/23 1238)  sodium chloride 0.9 % bolus 1,000 mL (0 mLs Intravenous Stopped 01/24/23 1417)  ondansetron (ZOFRAN) injection 4 mg (4 mg Intravenous Given 01/24/23 1238)  sodium chloride 0.9 % bolus 1,000 mL (1,000 mLs Intravenous New Bag/Given 01/24/23 1430)    ED Course/ Medical Decision Making/ A&P                             Medical Decision Making Amount and/or Complexity of Data Reviewed Labs: ordered. Radiology: ordered.  Risk Prescription drug management. Decision regarding hospitalization.   BP (!) 113/93 (BP Location: Left Arm)   Pulse (!) 165   Temp 98.2 F (36.8 C) (Oral)   Resp 17   Ht 5\' 2"  (1.575 m)   Wt 59.9 kg   SpO2 100%   BMI 24.14 kg/m   79:65 AM  52 year old female significant history of Crohn's disease presenting with complaint of abdominal pain.  Patient states she has a history of small bowel obstruction secondary to Crohn's disease last hospitalized a little over a month ago.  She states she did not have any surgery at that time because she was too emaciated.  She "checked myself out ".  She is endorsing having diffuse abdominal cramping ongoing for nearly 2 weeks with associated nausea and vomiting and decreased bowel movement.  She is not eating much but states she is able to drink fluid until yesterday.  She cannot tolerate fluid.  She feels very dehydrated.  She thinks she may have another SBO.  She is currently on steroid for her Crohn's disease.  She denies any urinary symptoms denies fever chills chest pain or shortness of breath.  On exam patient is laying in right lateral cubitus position appears mildly uncomfortable but nontoxic.  She has an emesis bag next to her.   She is tachycardic abdomen is diffusely tender but bowel sounds present.  Vital signs notable for markedly tachycardia with a heart rate of 165, will have it rechecked.  Workup initiated.  Will give IV fluid, antiemetic, and opiate pain medication for symptom control.  -Labs ordered, independently viewed and interpreted by me.  Labs remarkable for reassuring labs -The patient was maintained on a cardiac monitor.  I personally viewed and interpreted the cardiac monitored which showed an underlying rhythm of: NSR -Imaging independently viewed and interpreted by me  and I agree with radiologist's interpretation.  Result remarkable for acute abdominal series showing high grade bowel obstruction -This patient presents to the ED for concern of abd pain, this involves an extensive number of treatment options, and is a complaint that carries with it a high risk of complications and morbidity.  The differential diagnosis includes SBO, gastritis, GERD, colitis, diverticulitis, pancreatitis, appendicitis, constipation, UTI, pyelo, crohn's flare -Co morbidities that complicate the patient evaluation includes hx of SBO, crohn's disease -Treatment includes offering NG tube, morphine, IVF -Reevaluation of the patient after these medicines showed that the patient stayed the same -PCP office notes or outside notes reviewed -Discussion with specialist general surgery, Remi Haggard who request medicine admission.  They will be available for consultation and will see pt -Escalation to admission/observation considered: patient is amenable for admission  2:43 PM Appreciate consultation from [facility] hospitalist Dr. Robb Matar who agrees to see and will admit patient for further care.  Please note blood pressure is soft at 81/53.  Additional IV fluid given.  She may benefit from abdominal pelvis CT scan but I will defer that to oncoming team for that decision.  Labs does not reflect signs of bowel perforation.  Patient is small in  stature and I suspect her low blood pressure is not too far off from her baseline.  Sepsis considered.  Will order lactic acid and blood culture.        Final Clinical Impression(s) / ED Diagnoses Final diagnoses:  SBO (small bowel obstruction) [facility])    Rx / DC Orders ED Discharge Orders     None         Fayrene Helper, PA-C 01/24/23 1446    Melene Plan, DO 01/24/23 1513

## 2023-01-24 NOTE — H&P (Signed)
History and Physical    Patient: Tara Berger ZOX:[id number] DOB: [birth year] DOA: 01/24/2023 DOS: the patient was seen and examined on 01/24/2023 PCP: Pcp, No  Patient coming from: Home  Chief Complaint:  Chief Complaint  Patient presents with   Abdominal Pain   HPI: Fabian Almendinger is a 52 y.o. 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  She was able to tolerate drinking fluids until yesterday.  She has been having some loose stools, but no BM in the last 2 days.   No melena or hematochezia.  No fever, chills or night sweats. No sore throat, rhinorrhea, dyspnea, wheezing or hemoptysis.  No chest pain, palpitations, diaphoresis, PND, orthopnea or pitting edema of the lower extremities. No flank pain, dysuria, frequency or hematuria.  No polyuria, polydipsia, polyphagia or blurred vision.  Lab work: Her CBC showed a white count of 8.8, hemoglobin 11.1 g/dL platelets 811.  Lipase was normal.  CMP showed a potassium of 3.3 mmol/L, glucose 102 mg/dL and albumin 2.9 g/dL.  The rest of the CMP measurements were normal after calcium correction.  Imaging: Three-way abdomen show high-grade small bowel obstruction.  No free air.  Pleural parenchymal scaring in the left hemithorax.   ED course: Initial vital signs were  temperature 98.2 F, pulse 165, respirations 17, BP 113/93 mmHg O2 sat 100% on room air.  The patient received 2000 mL of normal saline bolus, morphine 4 mg IVP and ondansetron 4 mg IVP.  Review of Systems: As mentioned in the history of present illness. All other systems reviewed and are negative. History reviewed. Past medical history as above. History reviewed. No pertinent surgical history. Social History:  reports that she has been smoking cigarettes. She has been smoking an average of .5 packs per day. She does not have any smokeless tobacco history on file. No history on file for alcohol use and drug use.  Allergies  Allergen Reactions   Latex     History reviewed.  Several family members with hypertension  Prior to Admission medications   Not on File    Physical Exam: Vitals:   01/24/23 1245 01/24/23 1300 01/24/23 1335 01/24/23 1400  BP: 95/68 96/60 (!) 89/54 (!) 81/53  Pulse: (!) 116 (!) 108 98 97  Resp: 18 17 13 15   Temp:      TempSrc:      SpO2: 99% 98% 99% 99%  Weight:      Height:       Physical Exam Vitals and nursing note reviewed.  Constitutional:      General: She is awake. She is not in acute distress.    Appearance: She is well-developed. She is ill-appearing.     Comments: Chronically ill-appearing.  HENT:     Head: Normocephalic.     Nose: No rhinorrhea.     Mouth/Throat:     Mouth: Mucous membranes are dry.  Eyes:     General: No scleral icterus.  Pupils: Pupils are equal, round, and reactive to light.  Neck:     Vascular: No JVD.  Cardiovascular:     Rate and Rhythm: Normal rate and regular rhythm.     Heart sounds: S1 normal and S2 normal.  Pulmonary:     Effort: Pulmonary effort is normal.     Breath sounds: Normal breath sounds.  Abdominal:     General: Bowel sounds are normal.     Palpations: Abdomen is soft.     Tenderness: There is abdominal tenderness. There is no right CVA tenderness or left CVA tenderness.  Musculoskeletal:      Cervical back: Neck supple.     Right lower leg: No edema.     Left lower leg: No edema.  Skin:    General: Skin is warm and dry.  Neurological:     General: No focal deficit present.     Mental Status: She is alert and oriented to person, place, and time.  Psychiatric:        Mood and Affect: Mood normal.        Behavior: Behavior normal. Behavior is cooperative.     Data Reviewed:  There are no new results to review at this time.  Vent. rate 142 BPM PR interval 80 ms QRS duration 64 ms QT/QTcB 360/554 ms P-R-T axes 63 65 254 Sinus tachycardia Probable left atrial enlargement Repol abnrm suggests ischemia, diffuse leads Minimal ST elevation, lateral leads Prolonged QT interval  Assessment and Plan: Principal Problem:   SBO (small bowel obstruction) (HCC) Inpatient/MedSurg. Keep NPO. Declined NTG at [facility] for now. Continue IV fluids. Analgesics as needed. Antiemetics as needed. Pantoprazole 40 mg IVP every 24 hours. Keep electrolytes optimized. Follow-up CBC and CMP in AM. Follow-up imaging in the morning. General surgery input appreciated.  Active Problems:   Crohn's disease (HCC) Will consult GI during this hospitalization    Essential hypertension Parenteral antihypertensives as needed.    Left hemiparesis (HCC) Supportive care. Consult physical therapy in AM.    Pulmonary embolism on right [facility]) And history of DVT. Has not been taking apixaban. Will begin enoxaparin SQ twice daily.    Tobacco use disorder Tobacco cessation advised. Nicotine replacement therapy ordered.      Advance Care Planning:   Code Status: Full Code   Consults: [facility]. Left message for [facility].  Family Communication:   Severity of Illness: The appropriate patient status for this patient is INPATIENT. Inpatient status is judged to be reasonable and necessary in order to provide the required intensity of service to ensure the patient's safety. The  patient's presenting symptoms, physical exam findings, and initial radiographic and laboratory data in the context of their chronic comorbidities is felt to place them at high risk for further clinical deterioration. Furthermore, it is not anticipated that the patient will be medically stable for discharge from the hospital within 2 midnights of admission.   * I certify that at the point of admission it is my clinical judgment that the patient will require inpatient hospital care spanning beyond 2 midnights from the point of admission due to high intensity of service, high risk for further deterioration and high frequency of surveillance required.*  Author: Bobette Mo, MD 01/24/2023 2:39 PM  For on call review [email or web address].   This document was prepared using Dragon voice recognition software and may contain some unintended transcription errors.

## 2023-01-24 NOTE — ED Notes (Signed)
Pt refused NG tube. Greta Doom, PA notified and stated to hold off on NG tube for now

## 2023-01-24 NOTE — ED Notes (Signed)
GI provider at bedside. Pt stated to provider that she did not want the NG tube. Provider verbalized understanding and stated we can wait on the NG tube and if the pt starts vomiting, to place the tube.

## 2023-01-24 NOTE — ED Notes (Signed)
Greta Doom, PA notified of pt BP

## 2023-01-24 NOTE — ED Triage Notes (Signed)
Pt coming in today saying her chrons disease is flaring up. Pt also states she was seen recently at the hospital and has a GI obstruction.

## 2023-01-24 NOTE — Progress Notes (Signed)
Patient could use a PT consult. She is unsteady on her feet and shuffles. She also has some spacial awareness issues and needs to use a walker. She said she has one at home but does not know how to use it. She went into the bathroom, and I had to guide her to the toilet or she would have sat on the floor.

## 2023-01-24 NOTE — Progress Notes (Addendum)
ANTICOAGULATION CONSULT NOTE  Pharmacy Consult for Lovenox Indication: Hx CVA on Eliquis  Allergies  Allergen Reactions   Latex     Patient Measurements: Height: 5\' 2"  (157.5 cm) Weight: 59.9 kg (132 lb) IBW/kg (Calculated) : 50.1  Vital Signs: Temp: 98.1 F (36.7 C) (06/12 1809) Temp Source: Oral (06/12 1536) BP: 111/77 (06/12 1809) Pulse Rate: 80 (06/12 1809)  Labs: Recent Labs    01/24/23 1240  HGB 11.1*  HCT 34.9*  PLT 270  CREATININE 0.86    Estimated Creatinine Clearance: 61.2 mL/min (by C-G formula based on SCr of 0.86 mg/dL).   Medical History: History reviewed. No pertinent past medical history.  Medications:  Medications Prior to Admission  Medication Sig Dispense Refill Last Dose   apixaban (ELIQUIS) 5 MG TABS tablet Take 5 mg by mouth 2 (two) times daily.   Past Month   aspirin 81 MG chewable tablet Chew 81 mg by mouth daily.   Past Month   mirtazapine (REMERON) 15 MG tablet Take 15 mg by mouth at bedtime.   Past Month   Scheduled:   enoxaparin (LOVENOX) injection  1 mg/kg Subcutaneous BID   nicotine  14 mg Transdermal Daily   pantoprazole (PROTONIX) IV  40 mg Intravenous Daily   PRN: acetaminophen **OR** acetaminophen, morphine injection, prochlorperazine   Assessment: 52 y.o. female admitted 01/24/2023 for high-grade SBO. Recently started on Eliquis (and ASA 81 mg) for CVA in Dec 2023, the cause of which was thought to be rheumatic mitral valve disease (with severe stenosis noted). Unable to take Eliquis for the past days/weeks d/t SBO. No immediate plans for Surgery; Pharmacy consulted to cover with Lovenox for now.  Today, 01/24/2023: CBC: Hgb slightly low; Plt WNL SCr: normal; at baseline Previous anticoagulation: Eliquis 5 mg PO bid; last dose sometime during the past month  Goal of Therapy: Anti-Xa level 0.6-1 units/ml 4hrs after LMWH dose given  Plan: Lovenox 60 mg SQ q12 hr Monitor for signs of bleeding or worsening  thrombosis Pharmacy will continue to follow patient peripherally while on Lovenox for dose adjustments and clinical status changes F/u Surgical plans, and ability to transition back to Eliquis DOACs not approved for stroke prevention in setting of severe mitral stenosis, but appears this is just valvular disease without any arrhythmia present - would defer to Cardiology as to appropriate oral therapy in this unusual situation  Bernadene Person, PharmD, BCPS [phone] 01/24/2023, 6:56 PM

## 2023-01-24 NOTE — Consult Note (Signed)
Consult Note  Tara Berger 1970-10-13  [id number].    Requesting MD: Dr. Adela Lank Chief Complaint/Reason for Consult: Crohn's with ?SBO  HPI:  52 y.o. 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  She describes pain as diffuse and cramping and began approximately 2 weeks ago after she left AMA from [facility] although pain had never completely resolved during admission.  She had a non-bloody BM this AM.  She is passing some flatus.  She has had decreased oral intake though she has been able to drink fluids up until yesterday at which time she could no longer tolerate fluids.  She was admitted at [facility] 5/13-5/23 for a small bowel obstruction.  She had an NGT and was on TPN for malnourishment.  General surgery team was planning for surgery in approximately 2 weeks from 5/23 -due to malnourishment and increased risk for short gut they were wanting to further optimize her prior to surgery.  She left AMA from that admission. CT from 5/13 impression available in care everywhere and notable for high-grade SBO.  She sees GI in [facility] for management of her Crohn's, although no notes specifically from gastroenterology in the chart.  She reports she has never been on biologic therapy and has been off steroids for the last month when she left AMA. She takes Eliquis for hx of CVA.   Workup in ED here significant for abdominal x-ray showing high-grade small bowel obstruction without free air.  General surgery asked to see in regards to small bowel obstruction.  At time of my exam she continues to have abdominal pain though improved with pain medications.  She is not currently nauseous.  Last episode of emesis this AM.  Substance use: Everyday cigarette  smoker Allergies: Latex Blood thinners: eliquis Past Surgeries: bowel resection in 2006, and 2011   ROS: ROS reviewed and as above  History reviewed. No pertinent family history.  History reviewed. No pertinent past medical history.  History reviewed. No pertinent surgical history.  Social History:  reports that she has been smoking cigarettes. She has been smoking an average of .5 packs per day. She does not have any smokeless tobacco history on file. No history on file for alcohol use and drug use.  Allergies:  Allergies  Allergen Reactions   Latex     (Not in a hospital admission)   Blood pressure (!) 89/54, pulse 98, temperature 98.2 F (36.8 C), temperature source Oral, resp. rate 13, height 5\' 2"  (1.575 m), weight 59.9 kg, SpO2 99 %. Physical Exam: General: pleasant, WD, thin female who is laying in bed in NAD HEENT: head is normocephalic, atraumatic.  Sclera are noninjected.  Pupils equal and round. EOMs intact.  Ears and nose without any masses or lesions.  Mouth is pink and dry Heart: regular, rate, and rhythm. Palpable radial and pedal pulses bilaterally Lungs:  Respiratory effort nonlabored Abd: soft, mild generalized ttp without peritonitis, ND, surgical scars present MSK: all 4 extremities are symmetrical with no cyanosis, clubbing, or edema. Skin: warm and dry with no masses, lesions, or rashes Neuro: Cranial nerves 2-12 grossly intact, sensation is normal throughout Psych: A&Ox3 with an appropriate affect.    Results for orders placed or performed during the hospital encounter of 01/24/23 (from the past 48 hour(s))  CBC  with Differential     Status: Abnormal   Collection Time: 01/24/23 12:40 PM  Result Value Ref Range   WBC 8.8 4.0 - 10.5 K/uL   RBC 3.93 3.87 - 5.11 MIL/uL   Hemoglobin 11.1 (L) 12.0 - 15.0 g/dL   HCT 16.1 (L) 09.6 - 04.5 %   MCV 88.8 80.0 - 100.0 fL   MCH 28.2 26.0 - 34.0 pg   MCHC 31.8 30.0 - 36.0 g/dL   RDW 40.9 81.1 - 91.4 %    Platelets 270 150 - 400 K/uL   nRBC 0.0 0.0 - 0.2 %   Neutrophils Relative % 78 %   Neutro Abs 6.9 1.7 - 7.7 K/uL   Lymphocytes Relative 15 %   Lymphs Abs 1.3 0.7 - 4.0 K/uL   Monocytes Relative 6 %   Monocytes Absolute 0.5 0.1 - 1.0 K/uL   Eosinophils Relative 1 %   Eosinophils Absolute 0.0 0.0 - 0.5 K/uL   Basophils Relative 0 %   Basophils Absolute 0.0 0.0 - 0.1 K/uL   Immature Granulocytes 0 %   Abs Immature Granulocytes 0.02 0.00 - 0.07 K/uL    Comment: Performed at [facility], [address], [place], [place] [address]  Comprehensive metabolic panel     Status: Abnormal   Collection Time: 01/24/23 12:40 PM  Result Value Ref Range   Sodium 137 135 - 145 mmol/L   Potassium 3.3 (L) 3.5 - 5.1 mmol/L   Chloride 99 98 - 111 mmol/L   CO2 25 22 - 32 mmol/L   Glucose, Bld 102 (H) 70 - 99 mg/dL    Comment: Glucose reference range applies only to samples taken after fasting for at least 8 hours.   BUN 18 6 - 20 mg/dL   Creatinine, Ser 6.21 0.44 - 1.00 mg/dL   Calcium 8.0 (L) 8.9 - 10.3 mg/dL   Total Protein 6.7 6.5 - 8.1 g/dL   Albumin 2.9 (L) 3.5 - 5.0 g/dL   AST 40 15 - 41 U/L   ALT 29 0 - 44 U/L   Alkaline Phosphatase 101 38 - 126 U/L   Total Bilirubin 1.2 0.3 - 1.2 mg/dL   GFR, Estimated >30 >86 mL/min    Comment: (NOTE) Calculated using the CKD-EPI Creatinine Equation (2021)    Anion gap 13 5 - 15    Comment: Performed at [facility], [address] [place], [place] [address]  Lipase, blood     Status: None   Collection Time: 01/24/23 12:40 PM  Result Value Ref Range   Lipase 22 11 - 51 U/L    Comment: Performed at [facility], [address], [place], [place] [address]   DG ABD ACUTE 2+V W 1V CHEST  Result Date: 01/24/2023 CLINICAL DATA:  Crohn disease, recent obstruction. Abdominal pain and vomiting. EXAM: DG ABDOMEN ACUTE WITH 1 VIEW CHEST COMPARISON:  06/13/2010. FINDINGS: Frontal view of the chest shows  midline trachea and normal heart size. Pleuroparenchymal scarring in the left hemithorax, new from 06/13/2010. Lungs are otherwise clear. Markedly distended loops of small bowel in the upper abdomen with associated air-fluid levels. No colonic gas. Sutures are seen in the right lower quadrant. IVC filter in place. No free air. IMPRESSION: 1. High-grade small bowel obstruction.  No free air. 2. Pleuroparenchymal scarring in the left hemithorax. Electronically Signed   By: Leanna Battles M.D.   On: 01/24/2023 13:01      Assessment/Plan High-grade SBO in setting  of Crohn's   Patient seen and examined and relevant labs and imaging personally reviewed including CT impression in care everywhere from [facility] admission 5/13 which showed a high-grade SBO.  Records/notes from [facility] admission in care everywhere were reviewed.  Albumin today is 2.9 -was 2.4 on 5/22.  She does not have peritonitis on exam.  Recommend/will order CT scan for further evaluation.  No current indication for emergency surgery. At this time recommend n.p.o. can hold off on NGT for now with fairly benign exam and not vomiting. If she starts having more nausea/vomiting or distention would place NGT. Recommend GI consult as well.  Given her recent admission with lack of resolution of high-grade SBO I am concerned she will not improve with conservative management and therefore may require exploratory surgery during admission.  Will discuss with MD.   - Agree with medical admission. We will follow with you.   FEN: NPO, IVF ID: None currently indicated VTE: okay for chemical prophylaxis from surgical standpoint  Crohn's disease Hypokalemia  I reviewed ED provider notes, last 24 h vitals and pain scores, last 48 h intake and output, last 24 h labs and trends, last 24 h imaging results, and care everywhere notes .   Trixie Deis, [facility] 01/24/2023, 2:12 PM Please see Amion for pager number during day hours  7:00am-4:30pm

## 2023-01-25 ENCOUNTER — Inpatient Hospital Stay (HOSPITAL_COMMUNITY): Payer: Medicaid Other

## 2023-01-25 ENCOUNTER — Encounter (HOSPITAL_COMMUNITY): Payer: Self-pay | Admitting: Internal Medicine

## 2023-01-25 DIAGNOSIS — K56609 Unspecified intestinal obstruction, unspecified as to partial versus complete obstruction: Secondary | ICD-10-CM | POA: Diagnosis not present

## 2023-01-25 LAB — CBC
HCT: 28.9 % — ABNORMAL LOW (ref 36.0–46.0)
Hemoglobin: 9.3 g/dL — ABNORMAL LOW (ref 12.0–15.0)
MCH: 28.8 pg (ref 26.0–34.0)
MCHC: 32.2 g/dL (ref 30.0–36.0)
MCV: 89.5 fL (ref 80.0–100.0)
Platelets: 208 10*3/uL (ref 150–400)
RBC: 3.23 MIL/uL — ABNORMAL LOW (ref 3.87–5.11)
RDW: 15 % (ref 11.5–15.5)
WBC: 7.4 10*3/uL (ref 4.0–10.5)
nRBC: 0 % (ref 0.0–0.2)

## 2023-01-25 LAB — URINALYSIS, ROUTINE W REFLEX MICROSCOPIC
Bilirubin Urine: NEGATIVE
Glucose, UA: NEGATIVE mg/dL
Ketones, ur: 5 mg/dL — AB
Nitrite: NEGATIVE
Protein, ur: NEGATIVE mg/dL
Specific Gravity, Urine: >1.046 — ABNORMAL HIGH (ref 1.005–1.030)
WBC, UA: >50 WBC/hpf (ref 0–5)
pH: 5 (ref 5.0–8.0)

## 2023-01-25 LAB — COMPREHENSIVE METABOLIC PANEL
ALT: 23 U/L (ref 0–44)
AST: 26 U/L (ref 15–41)
Albumin: 2.2 g/dL — ABNORMAL LOW (ref 3.5–5.0)
Alkaline Phosphatase: 74 U/L (ref 38–126)
Anion gap: 7 (ref 5–15)
BUN: 13 mg/dL (ref 6–20)
CO2: 25 mmol/L (ref 22–32)
Calcium: 7.7 mg/dL — ABNORMAL LOW (ref 8.9–10.3)
Chloride: 104 mmol/L (ref 98–111)
Creatinine, Ser: 0.75 mg/dL (ref 0.44–1.00)
GFR, Estimated: >60 mL/min (ref >60)
Glucose, Bld: 74 mg/dL (ref 70–99)
Potassium: 4.5 mmol/L (ref 3.5–5.1)
Sodium: 136 mmol/L (ref 135–145)
Total Bilirubin: 0.6 mg/dL (ref 0.3–1.2)
Total Protein: 5.5 g/dL — ABNORMAL LOW (ref 6.5–8.1)

## 2023-01-25 LAB — MAGNESIUM: Magnesium: 2.1 mg/dL (ref 1.7–2.4)

## 2023-01-25 LAB — HIV ANTIBODY (ROUTINE TESTING W REFLEX): HIV Screen 4th Generation wRfx: NONREACTIVE

## 2023-01-25 MED ORDER — LACTATED RINGERS IV SOLN: INTRAVENOUS | Status: DC

## 2023-01-25 MED ORDER — IOHEXOL 300 MG/ML  SOLN
100.0000 mL | Freq: Once | INTRAMUSCULAR | Status: AC | PRN
  Administered 2023-01-25: 100 mL via INTRAVENOUS

## 2023-01-25 NOTE — Progress Notes (Signed)
Assessment & Plan: HD#2 - small bowel obstruction, Crohn's disease  - continue NPO, IVF  - CT scan reviewed - appears to have distal SBO without active Crohn's  - GI to see  - encouraged OOB, ambulation  Clinically stable.  No nausea or emesis, no flatus or BM's. Abd soft without tenderness.  Appears to have distal SBO on CT scan.  Likely due to adhesions or stricture related to prior surgical resections for Crohn's disease.  May require ex laparotomy with lysis of adhesions versus small bowel resection.  Discussed with patient.  Will await GI assessment.        Darnell Level, MD [facility] A [facility] practice Office: [phone]        Chief Complaint: SBO  Subjective: Patient comfortable, in bed.  Denies flatus or BM.  Denies nausea or emesis.  Objective: Vital signs in last 24 hours: Temp:  [98.1 F (36.7 C)-98.3 F (36.8 C)] 98.2 F (36.8 C) (06/13 0406) Pulse Rate:  [74-165] 75 (06/13 0406) Resp:  [13-20] 18 (06/13 0406) BP: (81-122)/(53-93) 91/54 (06/13 0406) SpO2:  [96 %-100 %] 99 % (06/13 0406) Weight:  [59.9 kg] 59.9 kg (06/12 1124) Last BM Date : 01/24/23  Intake/Output from previous day: 06/12 0701 - 06/13 0700 In: 2357.8 [P.O.:120; I.V.:1101.8; IV Piggyback:1136] Out: 900 [Urine:900] Intake/Output this shift: No intake/output data recorded.  Physical Exam: HEENT - sclerae clear, mucous membranes moist Abdomen - soft, minimal distension; non-tender; well healed midline wound Ext - no edema, non-tender  Lab Results:  Recent Labs    01/24/23 1240 01/25/23 0507  WBC 8.8 7.4  HGB 11.1* 9.3*  HCT 34.9* 28.9*  PLT 270 208   BMET Recent Labs    01/24/23 1240 01/25/23 0507  NA 137 136  K 3.3* 4.5  CL 99 104  CO2 25 25  GLUCOSE 102* 74  BUN 18 13  CREATININE 0.86 0.75  CALCIUM 8.0* 7.7*   PT/INR No results for input(s): "LABPROT", "INR" in the last 72 hours. Comprehensive Metabolic Panel:    Component Value Date/Time    NA 136 01/25/2023 0507   NA 137 01/24/2023 1240   K 4.5 01/25/2023 0507   K 3.3 (L) 01/24/2023 1240   CL 104 01/25/2023 0507   CL 99 01/24/2023 1240   CO2 25 01/25/2023 0507   CO2 25 01/24/2023 1240   BUN 13 01/25/2023 0507   BUN 18 01/24/2023 1240   CREATININE 0.75 01/25/2023 0507   CREATININE 0.86 01/24/2023 1240   GLUCOSE 74 01/25/2023 0507   GLUCOSE 102 (H) 01/24/2023 1240   CALCIUM 7.7 (L) 01/25/2023 0507   CALCIUM 8.0 (L) 01/24/2023 1240   AST 26 01/25/2023 0507   AST 40 01/24/2023 1240   ALT 23 01/25/2023 0507   ALT 29 01/24/2023 1240   ALKPHOS 74 01/25/2023 0507   ALKPHOS 101 01/24/2023 1240   BILITOT 0.6 01/25/2023 0507   BILITOT 1.2 01/24/2023 1240   PROT 5.5 (L) 01/25/2023 0507   PROT 6.7 01/24/2023 1240   ALBUMIN 2.2 (L) 01/25/2023 0507   ALBUMIN 2.9 (L) 01/24/2023 1240    Studies/Results: CT ABDOMEN PELVIS W CONTRAST  Result Date: 01/25/2023 CLINICAL DATA:  Crohn's exacerbation. Small bowel obstruction seen on abdominal series. EXAM: CT ABDOMEN AND PELVIS WITH CONTRAST TECHNIQUE: Multidetector CT imaging of the abdomen and pelvis was performed using the standard protocol following bolus administration of intravenous contrast. RADIATION DOSE REDUCTION: This exam was performed according to the departmental dose-optimization program which  includes automated exposure control, adjustment of the mA and/or kV according to patient size and/or use of iterative reconstruction technique. CONTRAST:  OMNIPAQUE IOHEXOL 300 MG/ML  SOLN COMPARISON:  Plain films 01/24/2023 FINDINGS: Lower chest: No acute abnormality. Hepatobiliary: Diffuse low-density throughout the liver compatible with fatty infiltration. No focal abnormality. Gallbladder unremarkable. Pancreas: No focal abnormality or ductal dilatation. Spleen: No focal abnormality.  Normal size. Adrenals/Urinary Tract: No adrenal abnormality. No focal renal abnormality. No stones or hydronephrosis. Urinary bladder is  unremarkable. Stomach/Bowel: Postoperative changes in the right lower quadrant. Markedly dilated small bowel into the pelvis. Distal small bowel is decompressed and grossly unremarkable. No evidence for active Crohn's disease. Findings compatible with distal high-grade small bowel obstruction, possibly related to stricture or adhesions. Stomach unremarkable. Vascular/Lymphatic: Aortic atherosclerosis. No evidence of aneurysm or adenopathy. IVC filter in place. Reproductive: Uterus and adnexa unremarkable.  No mass. Other: No free fluid or free air. Musculoskeletal: No acute bony abnormality. IMPRESSION: High-grade distal small-bowel obstruction. Distal ileum is decompressed and grossly unremarkable. No evidence for active Crohn's disease. Obstruction may be related to stricture or adhesions. Fatty liver. Aortic atherosclerosis. Electronically Signed   By: Charlett Nose M.D.   On: 01/25/2023 00:29   DG ABD ACUTE 2+V W 1V CHEST  Result Date: 01/24/2023 CLINICAL DATA:  Crohn disease, recent obstruction. Abdominal pain and vomiting. EXAM: DG ABDOMEN ACUTE WITH 1 VIEW CHEST COMPARISON:  06/13/2010. FINDINGS: Frontal view of the chest shows midline trachea and normal heart size. Pleuroparenchymal scarring in the left hemithorax, new from 06/13/2010. Lungs are otherwise clear. Markedly distended loops of small bowel in the upper abdomen with associated air-fluid levels. No colonic gas. Sutures are seen in the right lower quadrant. IVC filter in place. No free air. IMPRESSION: 1. High-grade small bowel obstruction.  No free air. 2. Pleuroparenchymal scarring in the left hemithorax. Electronically Signed   By: Leanna Battles M.D.   On: 01/24/2023 13:01      Darnell Level 01/25/2023  Patient ID: Tara Berger, female   DOB: July 15, 1971, 52 y.o.   MRN: [id number]

## 2023-01-25 NOTE — Evaluation (Signed)
Physical Therapy Evaluation Patient Details Name: Tara Berger MRN: [id number] DOB: 21-Dec-1970 Today's Date: 01/25/2023  History of Present Illness  52 y.o. 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 Dx of SBO.  Clinical Impression  Pt is mobilizing well independently. She ambulated 250' without an assistive device, no loss of balance. No further PT indicated, will sign off.        Recommendations for follow up therapy are one component of a multi-disciplinary discharge planning process, led by the attending physician.  Recommendations may be updated based on patient status, additional functional criteria and insurance authorization.  Follow Up Recommendations       Assistance Recommended at Discharge None  Patient can return home with the following       Equipment Recommendations None recommended by PT  Recommendations for Other Services       Functional Status Assessment Patient has not had a recent decline in their functional status     Precautions / Restrictions Precautions Precautions: None Restrictions Weight Bearing Restrictions: No      Mobility  Bed Mobility Overal bed mobility: Modified Independent             General bed mobility comments: used bedrail    Transfers Overall  transfer level: Independent                      Ambulation/Gait Ambulation/Gait assistance: Independent Gait Distance (Feet): 250 Feet Assistive device: None Gait Pattern/deviations: Wide base of support, Step-through pattern, Decreased stride length Gait velocity: WNL     General Gait Details: wide BOS, pt reports this is due to her pants being too big and falling down, no loss of balance  Stairs            Wheelchair Mobility    Modified Rankin (Stroke Patients Only)       Balance Overall balance assessment: No apparent balance deficits (not formally assessed)                                           Pertinent Vitals/Pain Pain Assessment Pain Assessment: No/denies pain    Home Living Family/patient expects to be discharged to:: Private residence Living Arrangements: Alone Available Help at Discharge: Family;Available PRN/intermittently Type of Home: Apartment Home Access: Elevator       Home Layout: One level Home Equipment: None      Prior Function Prior Level of Function : Independent/Modified Independent             Mobility Comments: doesn't drive, daughters and brother assist with transportation; walks without assistive device       Hand Dominance        Extremity/Trunk Assessment   Upper Extremity Assessment Upper Extremity Assessment: Overall WFL for tasks assessed    Lower Extremity Assessment Lower Extremity Assessment: Overall [facility]  for tasks assessed    Cervical / Trunk Assessment Cervical / Trunk Assessment: Normal  Communication   Communication: No difficulties  Cognition Arousal/Alertness: Awake/alert Behavior During Therapy: WFL for tasks assessed/performed Overall Cognitive Status: Within Functional Limits for tasks assessed                                          General Comments      Exercises     Assessment/Plan    PT Assessment Patient does not need any  further PT services  PT Problem List         PT Treatment Interventions      PT Goals (Current goals can be found in the Care Plan section)  Acute Rehab PT Goals Patient Stated Goal: to travel PT Goal Formulation: All assessment and education complete, DC therapy    Frequency       Co-evaluation               AM-PAC PT "6 Clicks" Mobility  Outcome Measure Help needed turning from your back to your side while in a flat bed without using bedrails?: None Help needed moving from lying on your back to sitting on the side of a flat bed without using bedrails?: None Help needed moving to and from a bed to a chair (including a wheelchair)?: None Help needed standing up from a chair using your arms (e.g., wheelchair or bedside chair)?: None Help needed to walk in hospital room?: None Help needed climbing 3-5 steps with a railing? : None 6 Click Score: 24    End of Session   Activity Tolerance: Patient tolerated treatment well Patient left: in chair;with call bell/phone within reach;with nursing/sitter in room Nurse Communication: Mobility status      Time: 7829-5621 PT Time Calculation (min) (ACUTE ONLY): 8 min   Charges:   PT Evaluation $PT Eval Low Complexity: 1 Low         Tamala Ser PT 01/25/2023  Acute Rehabilitation Services  Office [phone]

## 2023-01-25 NOTE — TOC CM/SW Note (Signed)
Transition of Care [facility]) - Inpatient Brief Assessment   Patient Details  Name: Tara Berger MRN: [id number] Date of Birth: [birth year]  Transition of Care (TOC) CM/SW Contact:    Armanda Heritage, RN Phone Number: 01/25/2023, 9:41 AM   Clinical Narrative:  Chart reviewed, patient from home and independent at baseline.  Plan is for discharge to home once medically stable.  No TOC needs identified at this time.  Transition of Care Asessment: Insurance and Status: Insurance coverage has been reviewed Patient has primary care physician: Yes Home environment has been reviewed: from home, independent Prior level of function:: independent Prior/Current Home Services: No current home services Social Determinants of Health Reivew: SDOH reviewed no interventions necessary (SDOH assessment not complete at time of this note) Readmission risk has been reviewed: Yes Transition of care needs: no transition of care needs at this time

## 2023-01-25 NOTE — Consult Note (Signed)
Reason for Consult: Small bowel obstruction Referring Physician: Hospital team  Tara Berger is an 52 y.o. female.  HPI: Patient seen and examined and her hospital computer chart including care everywhere was thoroughly reviewed and case discussed with multiple family members including her sister and currently as long as she does not eat or drink she does not throw up and does not have pain currently however has done poorly for 6 weeks signed out AMA after a 3-week hospital stay from the last facility although she does not remember being on TPN they wanted her to have better nutrition before they operated and she has been living at her sister's house for a few weeks and not been able to keep anything down and she has not been on a biologic Stelara in some time and she did not tolerate Remicade in the past and she did have 2 previous surgeries possibly in over 9 and possibly in 16 but is unaware of how much colon or bowel they took out and her family history is negative for any GI problems and she has no other complaints  History reviewed. No pertinent past medical history.  History reviewed. No pertinent surgical history.  History reviewed. No pertinent family history.  Social History:  reports that she has been smoking cigarettes. She has been smoking an average of .5 packs per day. She does not have any smokeless tobacco history on file. No history on file for alcohol use and drug use.  Allergies:  Allergies  Allergen Reactions   Latex     Medications: I have reviewed the patient's current medications.  Results for orders placed or performed during the hospital encounter of 01/24/23 (from the past 48 hour(s))  Urinalysis, Routine w reflex microscopic -Urine, Clean Catch     Status: Abnormal   Collection Time: 01/24/23  9:48 AM  Result Value Ref Range   Color, Urine YELLOW YELLOW   APPearance CLEAR CLEAR   Specific Gravity, Urine >1.046 (H) 1.005 - 1.030   pH 5.0 5.0 - 8.0   Glucose,  UA NEGATIVE NEGATIVE mg/dL   Hgb urine dipstick SMALL (A) NEGATIVE   Bilirubin Urine NEGATIVE NEGATIVE   Ketones, ur 5 (A) NEGATIVE mg/dL   Protein, ur NEGATIVE NEGATIVE mg/dL   Nitrite NEGATIVE NEGATIVE   Leukocytes,Ua LARGE (A) NEGATIVE   RBC / HPF 6-10 0 - 5 RBC/hpf   WBC, UA >50 0 - 5 WBC/hpf   Bacteria, UA RARE (A) NONE SEEN   Squamous Epithelial / HPF 0-5 0 - 5 /HPF   Mucus PRESENT     Comment: Performed at [facility], [address], [place], [place] [address]  CBC with Differential     Status: Abnormal   Collection Time: 01/24/23 12:40 PM  Result Value Ref Range   WBC 8.8 4.0 - 10.5 K/uL   RBC 3.93 3.87 - 5.11 MIL/uL   Hemoglobin 11.1 (L) 12.0 - 15.0 g/dL   HCT 60.4 (L) 54.0 - 98.1 %   MCV 88.8 80.0 - 100.0 fL   MCH 28.2 26.0 - 34.0 pg   MCHC 31.8 30.0 - 36.0 g/dL   RDW 19.1 47.8 - 29.5 %   Platelets 270 150 - 400 K/uL   nRBC 0.0 0.0 - 0.2 %   Neutrophils Relative % 78 %   Neutro Abs 6.9 1.7 - 7.7 K/uL   Lymphocytes Relative 15 %   Lymphs Abs 1.3 0.7 - 4.0 K/uL   Monocytes Relative 6 %  Monocytes Absolute 0.5 0.1 - 1.0 K/uL   Eosinophils Relative 1 %   Eosinophils Absolute 0.0 0.0 - 0.5 K/uL   Basophils Relative 0 %   Basophils Absolute 0.0 0.0 - 0.1 K/uL   Immature Granulocytes 0 %   Abs Immature Granulocytes 0.02 0.00 - 0.07 K/uL    Comment: Performed at [facility], [address], [place], [place] [address]  Comprehensive metabolic panel     Status: Abnormal   Collection Time: 01/24/23 12:40 PM  Result Value Ref Range   Sodium 137 135 - 145 mmol/L   Potassium 3.3 (L) 3.5 - 5.1 mmol/L   Chloride 99 98 - 111 mmol/L   CO2 25 22 - 32 mmol/L   Glucose, Bld 102 (H) 70 - 99 mg/dL    Comment: Glucose reference range applies only to samples taken after fasting for at least 8 hours.   BUN 18 6 - 20 mg/dL   Creatinine, Ser 4.78 0.44 - 1.00 mg/dL   Calcium 8.0 (L) 8.9 - 10.3 mg/dL   Total Protein 6.7 6.5 - 8.1 g/dL    Albumin 2.9 (L) 3.5 - 5.0 g/dL   AST 40 15 - 41 U/L   ALT 29 0 - 44 U/L   Alkaline Phosphatase 101 38 - 126 U/L   Total Bilirubin 1.2 0.3 - 1.2 mg/dL   GFR, Estimated >29 >56 mL/min    Comment: (NOTE) Calculated using the CKD-EPI Creatinine Equation (2021)    Anion gap 13 5 - 15    Comment: Performed at [facility], [address] [place], [place] [address]  Lipase, blood     Status: None   Collection Time: 01/24/23 12:40 PM  Result Value Ref Range   Lipase 22 11 - 51 U/L    Comment: Performed at [facility], [address] [place], [place] [address]  Magnesium     Status: Abnormal   Collection Time: 01/24/23 12:40 PM  Result Value Ref Range   Magnesium 1.6 (L) 1.7 - 2.4 mg/dL    Comment: Performed at [facility], [address], [place], [place] [address]  Phosphorus     Status: Abnormal   Collection Time: 01/24/23 12:40 PM  Result Value Ref Range   Phosphorus 4.7 (H) 2.5 - 4.6 mg/dL    Comment: Performed at [facility], [address] [place], [place] [address]  Lactic acid, plasma     Status: None   Collection Time: 01/24/23  2:52 PM  Result Value Ref Range   Lactic Acid, Venous 0.9 0.5 - 1.9 mmol/L    Comment: Performed at [facility], [address], [place], [place] [address]  Blood culture (routine x 2)     Status: None (Preliminary result)   Collection Time: 01/24/23  2:52 PM   Specimen: BLOOD  Result Value Ref Range   Specimen Description      BLOOD LEFT ANTECUBITAL Performed at [facility] [address], [place], [place] [address]    Special Requests      BOTTLES DRAWN AEROBIC AND ANAEROBIC Blood Culture adequate volume Performed at [facility], [address], [place], [place] [address]    Culture      NO GROWTH < 24 HOURS Performed at [facility], [address] [place], [place] [address]    Report Status  PENDING   Culture, blood (Routine X 2) w Reflex to ID Panel  Status: None (Preliminary result)   Collection Time: 01/24/23  9:12 PM   Specimen: BLOOD LEFT HAND  Result Value Ref Range   Specimen Description      BLOOD LEFT HAND Performed at [facility], [address], [place], [place] [address]    Special Requests      BOTTLES DRAWN AEROBIC AND ANAEROBIC BOTTLES DRAWN AEROBIC ONLY Performed at [facility], [address] [place], [place] [address]    Culture      NO GROWTH < 12 HOURS Performed at [facility], [address] [place], [place] [address]    Report Status PENDING   HIV Antibody (routine testing w rflx)     Status: None   Collection Time: 01/25/23  5:07 AM  Result Value Ref Range   HIV Screen 4th Generation wRfx Non Reactive Non Reactive    Comment: Performed at [facility], [address] [place], [place] [address]  Magnesium     Status: None   Collection Time: 01/25/23  5:07 AM  Result Value Ref Range   Magnesium 2.1 1.7 - 2.4 mg/dL    Comment: Performed at [facility], [address], [place], [place] [address]  CBC     Status: Abnormal   Collection Time: 01/25/23  5:07 AM  Result Value Ref Range   WBC 7.4 4.0 - 10.5 K/uL   RBC 3.23 (L) 3.87 - 5.11 MIL/uL   Hemoglobin 9.3 (L) 12.0 - 15.0 g/dL   HCT 53.6 (L) 64.4 - 03.4 %   MCV 89.5 80.0 - 100.0 fL   MCH 28.8 26.0 - 34.0 pg   MCHC 32.2 30.0 - 36.0 g/dL   RDW 74.2 59.5 - 63.8 %   Platelets 208 150 - 400 K/uL   nRBC 0.0 0.0 - 0.2 %    Comment: Performed at [facility], [address] [place], [place] [address]  Comprehensive metabolic panel     Status: Abnormal   Collection Time: 01/25/23  5:07 AM  Result Value Ref Range   Sodium 136 135 - 145 mmol/L   Potassium 4.5 3.5 - 5.1 mmol/L   Chloride 104 98 - 111 mmol/L   CO2 25 22 - 32 mmol/L   Glucose, Bld 74 70 - 99 mg/dL    Comment: Glucose reference range applies only to  samples taken after fasting for at least 8 hours.   BUN 13 6 - 20 mg/dL   Creatinine, Ser 3.29 0.44 - 1.00 mg/dL   Calcium 7.7 (L) 8.9 - 10.3 mg/dL   Total Protein 5.5 (L) 6.5 - 8.1 g/dL   Albumin 2.2 (L) 3.5 - 5.0 g/dL   AST 26 15 - 41 U/L   ALT 23 0 - 44 U/L   Alkaline Phosphatase 74 38 - 126 U/L   Total Bilirubin 0.6 0.3 - 1.2 mg/dL   GFR, Estimated >51 >88 mL/min    Comment: (NOTE) Calculated using the CKD-EPI Creatinine Equation (2021)    Anion gap 7 5 - 15    Comment: Performed at [facility], [address] [place], [place] [address]    CT ABDOMEN PELVIS W CONTRAST  Result Date: 01/25/2023 CLINICAL DATA:  Crohn's exacerbation. Small bowel obstruction seen on abdominal series. EXAM: CT ABDOMEN AND PELVIS WITH CONTRAST TECHNIQUE: Multidetector CT imaging of the abdomen and pelvis was performed using the standard protocol following bolus administration of intravenous contrast. RADIATION DOSE REDUCTION: This exam  was performed according to the departmental dose-optimization program which includes automated exposure control, adjustment of the mA and/or kV according to patient size and/or use of iterative reconstruction technique. CONTRAST:  OMNIPAQUE IOHEXOL 300 MG/ML  SOLN COMPARISON:  Plain films 01/24/2023 FINDINGS: Lower chest: No acute abnormality. Hepatobiliary: Diffuse low-density throughout the liver compatible with fatty infiltration. No focal abnormality. Gallbladder unremarkable. Pancreas: No focal abnormality or ductal dilatation. Spleen: No focal abnormality.  Normal size. Adrenals/Urinary Tract: No adrenal abnormality. No focal renal abnormality. No stones or hydronephrosis. Urinary bladder is unremarkable. Stomach/Bowel: Postoperative changes in the right lower quadrant. Markedly dilated small bowel into the pelvis. Distal small bowel is decompressed and grossly unremarkable. No evidence for active Crohn's disease. Findings compatible with distal  high-grade small bowel obstruction, possibly related to stricture or adhesions. Stomach unremarkable. Vascular/Lymphatic: Aortic atherosclerosis. No evidence of aneurysm or adenopathy. IVC filter in place. Reproductive: Uterus and adnexa unremarkable.  No mass. Other: No free fluid or free air. Musculoskeletal: No acute bony abnormality. IMPRESSION: High-grade distal small-bowel obstruction. Distal ileum is decompressed and grossly unremarkable. No evidence for active Crohn's disease. Obstruction may be related to stricture or adhesions. Fatty liver. Aortic atherosclerosis. Electronically Signed   By: Charlett Nose M.D.   On: 01/25/2023 00:29   DG ABD ACUTE 2+V W 1V CHEST  Result Date: 01/24/2023 CLINICAL DATA:  Crohn disease, recent obstruction. Abdominal pain and vomiting. EXAM: DG ABDOMEN ACUTE WITH 1 VIEW CHEST COMPARISON:  06/13/2010. FINDINGS: Frontal view of the chest shows midline trachea and normal heart size. Pleuroparenchymal scarring in the left hemithorax, new from 06/13/2010. Lungs are otherwise clear. Markedly distended loops of small bowel in the upper abdomen with associated air-fluid levels. No colonic gas. Sutures are seen in the right lower quadrant. IVC filter in place. No free air. IMPRESSION: 1. High-grade small bowel obstruction.  No free air. 2. Pleuroparenchymal scarring in the left hemithorax. Electronically Signed   By: Leanna Battles M.D.   On: 01/24/2023 13:01    ROS Blood pressure 114/75, pulse 63, temperature 98 F (36.7 C), temperature source Oral, resp. rate 14, height 5\' 2"  (1.575 m), weight 59.9 kg, SpO2 100 %. Physical Exam she looks better than her story sounds her abdomen is actually soft nontender labs are okay except albumin CBC okay  Assessment/Plan: Crohn's disease with obstruction Plan: Since they tried IV steroids at the other hospital without any success I do not think redoing that is a great option and we talked about trying a biologic seeing how that works  but that would take lots of time and probably TPN in the meantime and it is hard to know when the hospital could get it if that would be even approved with her insurance as an outpatient and since no obvious Crohn's showed up on the CT and is similar lack of Crohn's was reported on the previous CT at the outlying hospital I do feel surgical options may be best and she is in agreement and we answered all of her and the family's questions about that and the recovery would be determined by how much Crohn's was present or whether this was just a adhesional stricture or a fibrotic Crohn's stricture and might not require a significant large operation but it be hard to know if it could be done laparoscopically or not and will ask my partner Dr. Lorenso Quarry to check on tomorrow  [facility] 01/25/2023, 3:24 PM

## 2023-01-25 NOTE — Progress Notes (Signed)
PROGRESS NOTE    Tara Berger  ZOX:[id number] DOB: 07/04/1971 DOA: 01/24/2023 PCP: Pcp, No     Brief Narrative:  Tara Berger is a 52 y.o. 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 She returned to the emergency department 01/24/23 with complaints of abdominal distention, abdominal pain, nausea and multiple episodes of emesis over the last 2 weeks.  She was able to tolerate drinking fluids until yesterday.  She has been having some loose stools, but no BM in the last 2 days.  At time of admission, imaging revealed high-grade small bowel obstruction.  Patient was admitted to the hospital and general surgery was consulted.  New events last 24 hours / Subjective: States that she continues to have abdominal pain but no nausea or vomiting.  No flatus or bowel movement.  Assessment & Plan:   Principal Problem:   SBO (small bowel obstruction) (HCC) Active Problems:   Crohn's disease (HCC)   Essential hypertension   Left hemiparesis (HCC)   Pulmonary embolism on right (HCC)   Tobacco use disorder   Small bowel obstruction -General surgery following -N.p.o., IV fluid  Crohn's disease -GI consulted  Hypertension -Hold antihypertensives in setting of n.p.o. status, low blood pressures  Chronic left hemiparesis following stroke -PT  History of PE, DVT -Has been noncompliant with Eliquis -Lovenox  Tobacco use -Nicotine patch    DVT prophylaxis:  Lovenox   Code Status: Full code Family Communication: None at bedside Disposition Plan: Home Status is: Inpatient Remains inpatient appropriate because: IV fluid    Antimicrobials:  Anti-infectives (From admission, onward)    None        Objective: Vitals:   01/24/23 2011 01/24/23 2353 01/25/23 0406 01/25/23 0951  BP: (!) 94/59 103/75 (!) 91/54 90/69  Pulse: 74 88 75 89  Resp: 18 18 18 16   Temp: 98.1 F (36.7 C) 98.3 F (36.8 C) 98.2 F (36.8 C) 98 F (36.7 C)  TempSrc: Oral Oral Oral Oral  SpO2: 98% 99% 99% (!) 86%  Weight:      Height:        Intake/Output Summary (Last 24 hours) at 01/25/2023 1209 Last data filed at 01/25/2023 1000 Gross per 24 hour  Intake 2562.3 ml  Output 900 ml  Net 1662.3 ml   Filed Weights   01/24/23 1124  Weight: 59.9 kg    Examination:  General exam: Appears calm  Respiratory system: Clear to auscultation. Respiratory effort normal. No respiratory distress. No conversational dyspnea.  Cardiovascular system: S1 & S2 heard, RRR. No murmurs. No pedal edema. Gastrointestinal system: Abdomen is nondistended, soft.  No bowel sounds heard Skin: No rashes, lesions or ulcers on exposed skin  Psychiatry: Judgement and insight appear normal. Mood & affect appropriate.   Data Reviewed: I have personally reviewed following labs and imaging studies  CBC: Recent Labs  Lab 01/24/23 1240 01/25/23 0507  WBC 8.8 7.4  NEUTROABS 6.9  --   HGB 11.1* 9.3*  HCT 34.9* 28.9*  MCV 88.8 89.5  PLT 270 208   Basic Metabolic Panel: Recent Labs  Lab 01/24/23  1240 01/25/23 0507  NA 137 136  K 3.3* 4.5  CL 99 104  CO2 25 25  GLUCOSE 102* 74  BUN 18 13  CREATININE 0.86 0.75  CALCIUM 8.0* 7.7*  MG 1.6* 2.1  PHOS 4.7*  --    GFR: Estimated Creatinine Clearance: 65.8 mL/min (by C-G formula based on SCr of 0.75 mg/dL). Liver Function Tests: Recent Labs  Lab 01/24/23 1240 01/25/23 0507  AST 40 26  ALT 29 23  ALKPHOS 101 74  BILITOT  1.2 0.6  PROT 6.7 5.5*  ALBUMIN 2.9* 2.2*   Recent Labs  Lab 01/24/23 1240  LIPASE 22   No results for input(s): "AMMONIA" in the last 168 hours. Coagulation Profile: No results for input(s): "INR", "PROTIME" in the last 168 hours. Cardiac Enzymes: No results for input(s): "CKTOTAL", "CKMB", "CKMBINDEX", "TROPONINI" in the last 168 hours. BNP (last 3 results) No results for input(s): "PROBNP" in the last 8760 hours. HbA1C: No results for input(s): "HGBA1C" in the last 72 hours. CBG: No results for input(s): "GLUCAP" in the last 168 hours. Lipid Profile: No results for input(s): "CHOL", "HDL", "LDLCALC", "TRIG", "CHOLHDL", "LDLDIRECT" in the last 72 hours. Thyroid Function Tests: No results for input(s): "TSH", "T4TOTAL", "FREET4", "T3FREE", "THYROIDAB" in the last 72 hours. Anemia Panel: No results for input(s): "VITAMINB12", "FOLATE", "FERRITIN", "TIBC", "IRON", "RETICCTPCT" in the last 72 hours. Sepsis Labs: Recent Labs  Lab 01/24/23 1452  LATICACIDVEN 0.9    Recent Results (from the past 240 hour(s))  Blood culture (routine x 2)     Status: None (Preliminary result)   Collection Time: 01/24/23  2:52 PM   Specimen: BLOOD  Result Value Ref Range Status   Specimen Description   Final    BLOOD LEFT ANTECUBITAL Performed at [facility], [address], [place], [place] [address]    Special Requests   Final    BOTTLES DRAWN AEROBIC AND ANAEROBIC Blood Culture adequate volume Performed at [facility], [address], [place], [place] [address]    Culture   Final    NO GROWTH < 24 HOURS Performed at [facility], [address], [place], [place] [address]    Report Status PENDING  Incomplete  Culture, blood (Routine X 2) w Reflex to ID Panel     Status: None (Preliminary result)   Collection Time: 01/24/23  9:12 PM   Specimen: BLOOD LEFT HAND  Result Value Ref Range Status   Specimen Description   Final    BLOOD LEFT  HAND Performed at [facility], [address], [place], [place] [address]    Special Requests   Final    BOTTLES DRAWN AEROBIC AND ANAEROBIC BOTTLES DRAWN AEROBIC ONLY Performed at [facility], [address], [place], [place] [address]    Culture   Final    NO GROWTH < 12 HOURS Performed at [facility], [address], [place], [place] [address]    Report Status PENDING  Incomplete      Radiology Studies: CT ABDOMEN PELVIS W CONTRAST  Result Date: 01/25/2023 CLINICAL DATA:  Crohn's exacerbation. Small bowel obstruction seen on abdominal series. EXAM: CT ABDOMEN AND PELVIS WITH CONTRAST TECHNIQUE: Multidetector CT imaging of the abdomen and pelvis was performed using the standard protocol following bolus administration of intravenous contrast. RADIATION DOSE REDUCTION: This exam was performed according to the departmental dose-optimization program which includes automated exposure control, adjustment of the mA and/or kV according  to patient size and/or use of iterative reconstruction technique. CONTRAST:  OMNIPAQUE IOHEXOL 300 MG/ML  SOLN COMPARISON:  Plain films 01/24/2023 FINDINGS: Lower chest: No acute abnormality. Hepatobiliary: Diffuse low-density throughout the liver compatible with fatty infiltration. No focal abnormality. Gallbladder unremarkable. Pancreas: No focal abnormality or ductal dilatation. Spleen: No focal abnormality.  Normal size. Adrenals/Urinary Tract: No adrenal abnormality. No focal renal abnormality. No stones or hydronephrosis. Urinary bladder is unremarkable. Stomach/Bowel: Postoperative changes in the right lower quadrant. Markedly dilated small bowel into the pelvis. Distal small bowel is decompressed and grossly unremarkable. No evidence for active Crohn's disease. Findings compatible with distal high-grade small bowel obstruction, possibly related to stricture or adhesions. Stomach unremarkable. Vascular/Lymphatic: Aortic  atherosclerosis. No evidence of aneurysm or adenopathy. IVC filter in place. Reproductive: Uterus and adnexa unremarkable.  No mass. Other: No free fluid or free air. Musculoskeletal: No acute bony abnormality. IMPRESSION: High-grade distal small-bowel obstruction. Distal ileum is decompressed and grossly unremarkable. No evidence for active Crohn's disease. Obstruction may be related to stricture or adhesions. Fatty liver. Aortic atherosclerosis. Electronically Signed   By: Charlett Nose M.D.   On: 01/25/2023 00:29   DG ABD ACUTE 2+V W 1V CHEST  Result Date: 01/24/2023 CLINICAL DATA:  Crohn disease, recent obstruction. Abdominal pain and vomiting. EXAM: DG ABDOMEN ACUTE WITH 1 VIEW CHEST COMPARISON:  06/13/2010. FINDINGS: Frontal view of the chest shows midline trachea and normal heart size. Pleuroparenchymal scarring in the left hemithorax, new from 06/13/2010. Lungs are otherwise clear. Markedly distended loops of small bowel in the upper abdomen with associated air-fluid levels. No colonic gas. Sutures are seen in the right lower quadrant. IVC filter in place. No free air. IMPRESSION: 1. High-grade small bowel obstruction.  No free air. 2. Pleuroparenchymal scarring in the left hemithorax. Electronically Signed   By: Leanna Battles M.D.   On: 01/24/2023 13:01      Scheduled Meds:  enoxaparin (LOVENOX) injection  1 mg/kg Subcutaneous BID   nicotine  14 mg Transdermal Daily   pantoprazole (PROTONIX) IV  40 mg Intravenous Daily   Continuous Infusions:  lactated ringers 125 mL/hr at 01/25/23 0821     LOS: 1 day   Time spent: 25 minutes   Noralee Stain, DO [facility] 01/25/2023, 12:09 PM   Available via Epic secure chat 7am-7pm After these hours, please refer to coverage provider listed on [email or web address]

## 2023-01-26 DIAGNOSIS — K56609 Unspecified intestinal obstruction, unspecified as to partial versus complete obstruction: Secondary | ICD-10-CM | POA: Diagnosis not present

## 2023-01-26 LAB — BASIC METABOLIC PANEL
Anion gap: 9 (ref 5–15)
BUN: 11 mg/dL (ref 6–20)
CO2: 21 mmol/L — ABNORMAL LOW (ref 22–32)
Calcium: 7.8 mg/dL — ABNORMAL LOW (ref 8.9–10.3)
Chloride: 107 mmol/L (ref 98–111)
Creatinine, Ser: 0.66 mg/dL (ref 0.44–1.00)
GFR, Estimated: >60 mL/min (ref >60)
Glucose, Bld: 52 mg/dL — ABNORMAL LOW (ref 70–99)
Potassium: 4.1 mmol/L (ref 3.5–5.1)
Sodium: 137 mmol/L (ref 135–145)

## 2023-01-26 LAB — CBC
HCT: 33.8 % — ABNORMAL LOW (ref 36.0–46.0)
Hemoglobin: 10.3 g/dL — ABNORMAL LOW (ref 12.0–15.0)
MCH: 28.4 pg (ref 26.0–34.0)
MCHC: 30.5 g/dL (ref 30.0–36.0)
MCV: 93.1 fL (ref 80.0–100.0)
Platelets: 187 10*3/uL (ref 150–400)
RBC: 3.63 MIL/uL — ABNORMAL LOW (ref 3.87–5.11)
RDW: 15 % (ref 11.5–15.5)
WBC: 6.3 10*3/uL (ref 4.0–10.5)
nRBC: 0.3 % — ABNORMAL HIGH (ref 0.0–0.2)

## 2023-01-26 LAB — CULTURE, BLOOD (ROUTINE X 2)

## 2023-01-26 NOTE — Progress Notes (Addendum)
[facility] Progress Note  SUBJECTIVE:   Interval history: Tara Berger was seen and evaluated today at bedside. Resting comfortably in bed, remains on bowel rest, no NG tube (patient noted that she "couldn't handle it"). No nausea or vomiting. No return of bowel function. Having abdominal pain superior to her umbilicus, where she states that she has had multiple resections.  History reviewed. No pertinent past medical history. History reviewed. No pertinent surgical history. Current Facility-Administered Medications  Medication Dose Route Frequency Provider Last Rate Last Admin   acetaminophen (TYLENOL) tablet 650 mg  650 mg Oral Q6H PRN Bobette Mo, MD       Or   acetaminophen (TYLENOL) suppository 650 mg  650 mg Rectal Q6H PRN Bobette Mo, MD       enoxaparin (LOVENOX) injection 60 mg  1 mg/kg Subcutaneous BID Danford Bad, RPH   60 mg at 01/26/23 1156   lactated ringers infusion   Intravenous Continuous Noralee Stain, DO 125 mL/hr at 01/26/23 0111 New Bag at 01/26/23 0111   morphine (PF) 4 MG/ML injection 4 mg  4 mg Intravenous Q3H PRN Bobette Mo, MD   4 mg at 01/26/23 1156   nicotine (NICODERM CQ - dosed in mg/24 hours) patch 14 mg  14 mg Transdermal Daily Bobette Mo, MD   14 mg at 01/26/23 1157   pantoprazole (PROTONIX) injection 40 mg  40 mg Intravenous Daily Bobette Mo, MD   40 mg at 01/26/23 1156   prochlorperazine (COMPAZINE) injection 10 mg  10 mg Intravenous Q6H PRN Bobette Mo, MD       Allergies as of 01/24/2023 - Review Complete 01/24/2023  Allergen Reaction Noted   Latex     Review of Systems:  Review of Systems  Gastrointestinal:  Positive for abdominal pain. Negative for nausea and vomiting.    OBJECTIVE:   Temp:  [98.4 F (36.9 C)-98.5 F (36.9 C)] 98.4 F (36.9 C) (06/14 0539) Pulse Rate:  [63-81] 79 (06/14 0539) Resp:  [14-17] 17 (06/14 0539) BP: (104-114)/(64-77) 104/64 (06/14 0539) SpO2:   [97 %-100 %] 97 % (06/14 0539) Last BM Date : 01/24/23 Physical Exam Constitutional:      General: She is not in acute distress.    Appearance: She is not ill-appearing, toxic-appearing or diaphoretic.  Cardiovascular:     Rate and Rhythm: Normal rate and regular rhythm.  Pulmonary:     Effort: No respiratory distress.     Breath sounds: Normal breath sounds.  Abdominal:     General: Bowel sounds are normal. There is no distension.     Palpations: Abdomen is soft.     Tenderness: There is abdominal tenderness. There is no guarding.  Musculoskeletal:     Right lower leg: No edema.     Left lower leg: No edema.  Skin:    General: Skin is warm and dry.  Neurological:     Mental Status: She is alert.     Labs: Recent Labs    01/24/23 1240 01/25/23 0507 01/26/23 0450  WBC 8.8 7.4 6.3  HGB 11.1* 9.3* 10.3*  HCT 34.9* 28.9* 33.8*  PLT 270 208 187   BMET Recent Labs    01/24/23 1240 01/25/23 0507 01/26/23 0450  NA 137 136 137  K 3.3* 4.5 4.1  CL 99 104 107  CO2 25 25 21*  GLUCOSE 102* 74 52*  BUN 18 13 11   CREATININE 0.86 0.75 0.66  CALCIUM 8.0* 7.7* 7.8*  LFT Recent Labs    01/25/23 0507  PROT 5.5*  ALBUMIN 2.2*  AST 26  ALT 23  ALKPHOS 74  BILITOT 0.6   PT/INR No results for input(s): "LABPROT", "INR" in the last 72 hours. Diagnostic imaging: CT ABDOMEN PELVIS W CONTRAST  Result Date: 01/25/2023 CLINICAL DATA:  Crohn's exacerbation. Small bowel obstruction seen on abdominal series. EXAM: CT ABDOMEN AND PELVIS WITH CONTRAST TECHNIQUE: Multidetector CT imaging of the abdomen and pelvis was performed using the standard protocol following bolus administration of intravenous contrast. RADIATION DOSE REDUCTION: This exam was performed according to the departmental dose-optimization program which includes automated exposure control, adjustment of the mA and/or kV according to patient size and/or use of iterative reconstruction technique. CONTRAST:   OMNIPAQUE IOHEXOL 300 MG/ML  SOLN COMPARISON:  Plain films 01/24/2023 FINDINGS: Lower chest: No acute abnormality. Hepatobiliary: Diffuse low-density throughout the liver compatible with fatty infiltration. No focal abnormality. Gallbladder unremarkable. Pancreas: No focal abnormality or ductal dilatation. Spleen: No focal abnormality.  Normal size. Adrenals/Urinary Tract: No adrenal abnormality. No focal renal abnormality. No stones or hydronephrosis. Urinary bladder is unremarkable. Stomach/Bowel: Postoperative changes in the right lower quadrant. Markedly dilated small bowel into the pelvis. Distal small bowel is decompressed and grossly unremarkable. No evidence for active Crohn's disease. Findings compatible with distal high-grade small bowel obstruction, possibly related to stricture or adhesions. Stomach unremarkable. Vascular/Lymphatic: Aortic atherosclerosis. No evidence of aneurysm or adenopathy. IVC filter in place. Reproductive: Uterus and adnexa unremarkable.  No mass. Other: No free fluid or free air. Musculoskeletal: No acute bony abnormality. IMPRESSION: High-grade distal small-bowel obstruction. Distal ileum is decompressed and grossly unremarkable. No evidence for active Crohn's disease. Obstruction may be related to stricture or adhesions. Fatty liver. Aortic atherosclerosis. Electronically Signed   By: Charlett Nose M.D.   On: 01/25/2023 00:29   DG ABD ACUTE 2+V W 1V CHEST  Result Date: 01/24/2023 CLINICAL DATA:  Crohn disease, recent obstruction. Abdominal pain and vomiting. EXAM: DG ABDOMEN ACUTE WITH 1 VIEW CHEST COMPARISON:  06/13/2010. FINDINGS: Frontal view of the chest shows midline trachea and normal heart size. Pleuroparenchymal scarring in the left hemithorax, new from 06/13/2010. Lungs are otherwise clear. Markedly distended loops of small bowel in the upper abdomen with associated air-fluid levels. No colonic gas. Sutures are seen in the right lower quadrant. IVC filter in place.  No free air. IMPRESSION: 1. High-grade small bowel obstruction.  No free air. 2. Pleuroparenchymal scarring in the left hemithorax. Electronically Signed   By: Leanna Battles M.D.   On: 01/24/2023 13:01    IMPRESSION: Stricturing small bowel Crohn's disease -Disease related surgeries including multiple small bowel resections -Has previously failed anti-TNF therapy, plan per last hospital report was to attempt ustekinumab in the outpatient setting, this has not yet been started -Colonoscopy 03/31/16 ileum biopsies showed active ileitis, proximal and distal colon biopsies unremarkable Distal small bowel stricture, small bowel obstruction -May be dealing with fibrotic tissue rather than disease related inflammation at this point -General Surgery following History CVA   PLAN: -Decision has been made to defer steroids at this point, I would agree -Await formal General Surgery recommendations regarding any surgical intervention plans -She remains on bowel rest awaiting return of bowel function, appears comfortable -On IV fluids -Has been previously set up with gastroenterology through [facility], she will need close outpatient follow up with their team after this hospital stay -Recommend check fecal calprotectin for completeness once bowel function returns -[facility] will follow  LOS: 2 days   Liliane Shi, [facility]

## 2023-01-26 NOTE — Progress Notes (Signed)
Assessment & Plan: HD#3 - small bowel obstruction, Crohn's disease  - continue NPO, IVF  - CT scan reviewed - appears to have distal SBO without active Crohn's  - GI saw and felt surgery may be next best option  - encouraged OOB, ambulation  - no bowel function yet, but states she feels better.  Clinically stable.  No nausea or emesis, no flatus or BM's. Abd soft without tenderness.  Appears to have distal SBO on CT scan.  Likely due to adhesions or stricture related to prior surgical resections for Crohn's disease.  May require ex laparotomy with lysis of adhesions versus small bowel resection.  Discussed with patient.  GI tends to agree with possible surgical intervention.  Will discuss possible timing with MD       Letha Cape, [facility] practice Office: [phone]        Chief Complaint: SBO  Subjective: Patient comfortable, in chair.  Denies flatus or BM.  Denies nausea or emesis.  Objective: Vital signs in last 24 hours: Temp:  [98.4 F (36.9 C)-98.5 F (36.9 C)] 98.4 F (36.9 C) (06/14 0539) Pulse Rate:  [63-81] 79 (06/14 0539) Resp:  [14-17] 17 (06/14 0539) BP: (104-114)/(64-77) 104/64 (06/14 0539) SpO2:  [97 %-100 %] 97 % (06/14 0539) Last BM Date : 01/24/23  Intake/Output from previous day: 06/13 0701 - 06/14 0700 In: 2218.7 [I.V.:2218.7] Out: 400 [Urine:400] Intake/Output this shift: Total I/O In: 0  Out: 300 [Urine:300]  Physical Exam: HEENT - sclerae clear, mucous membranes moist Abdomen - soft, minimal distension; non-tender; well healed midline wound Ext - no edema, non-tender  Lab Results:  Recent Labs    01/25/23 0507 01/26/23 0450  WBC 7.4 6.3  HGB 9.3* 10.3*  HCT 28.9* 33.8*  PLT 208 187   BMET Recent Labs    01/25/23 0507 01/26/23 0450  NA 136 137  K 4.5 4.1  CL 104 107  CO2 25 21*  GLUCOSE 74 52*  BUN 13 11  CREATININE 0.75 0.66  CALCIUM 7.7* 7.8*   PT/INR No results for  input(s): "LABPROT", "INR" in the last 72 hours. Comprehensive Metabolic Panel:    Component Value Date/Time   NA 137 01/26/2023 0450   NA 136 01/25/2023 0507   K 4.1 01/26/2023 0450   K 4.5 01/25/2023 0507   CL 107 01/26/2023 0450   CL 104 01/25/2023 0507   CO2 21 (L) 01/26/2023 0450   CO2 25 01/25/2023 0507   BUN 11 01/26/2023 0450   BUN 13 01/25/2023 0507   CREATININE 0.66 01/26/2023 0450   CREATININE 0.75 01/25/2023 0507   GLUCOSE 52 (L) 01/26/2023 0450   GLUCOSE 74 01/25/2023 0507   CALCIUM 7.8 (L) 01/26/2023 0450   CALCIUM 7.7 (L) 01/25/2023 0507   AST 26 01/25/2023 0507   AST 40 01/24/2023 1240   ALT 23 01/25/2023 0507   ALT 29 01/24/2023 1240   ALKPHOS 74 01/25/2023 0507   ALKPHOS 101 01/24/2023 1240   BILITOT 0.6 01/25/2023 0507   BILITOT 1.2 01/24/2023 1240   PROT 5.5 (L) 01/25/2023 0507   PROT 6.7 01/24/2023 1240   ALBUMIN 2.2 (L) 01/25/2023 0507   ALBUMIN 2.9 (L) 01/24/2023 1240    Studies/Results: CT ABDOMEN PELVIS W CONTRAST  Result Date: 01/25/2023 CLINICAL DATA:  Crohn's exacerbation. Small bowel obstruction seen on abdominal series. EXAM: CT ABDOMEN AND PELVIS WITH CONTRAST TECHNIQUE: Multidetector CT imaging of the abdomen and pelvis was performed using  the standard protocol following bolus administration of intravenous contrast. RADIATION DOSE REDUCTION: This exam was performed according to the departmental dose-optimization program which includes automated exposure control, adjustment of the mA and/or kV according to patient size and/or use of iterative reconstruction technique. CONTRAST:  OMNIPAQUE IOHEXOL 300 MG/ML  SOLN COMPARISON:  Plain films 01/24/2023 FINDINGS: Lower chest: No acute abnormality. Hepatobiliary: Diffuse low-density throughout the liver compatible with fatty infiltration. No focal abnormality. Gallbladder unremarkable. Pancreas: No focal abnormality or ductal dilatation. Spleen: No focal abnormality.  Normal size. Adrenals/Urinary  Tract: No adrenal abnormality. No focal renal abnormality. No stones or hydronephrosis. Urinary bladder is unremarkable. Stomach/Bowel: Postoperative changes in the right lower quadrant. Markedly dilated small bowel into the pelvis. Distal small bowel is decompressed and grossly unremarkable. No evidence for active Crohn's disease. Findings compatible with distal high-grade small bowel obstruction, possibly related to stricture or adhesions. Stomach unremarkable. Vascular/Lymphatic: Aortic atherosclerosis. No evidence of aneurysm or adenopathy. IVC filter in place. Reproductive: Uterus and adnexa unremarkable.  No mass. Other: No free fluid or free air. Musculoskeletal: No acute bony abnormality. IMPRESSION: High-grade distal small-bowel obstruction. Distal ileum is decompressed and grossly unremarkable. No evidence for active Crohn's disease. Obstruction may be related to stricture or adhesions. Fatty liver. Aortic atherosclerosis. Electronically Signed   By: Charlett Nose M.D.   On: 01/25/2023 00:29   DG ABD ACUTE 2+V W 1V CHEST  Result Date: 01/24/2023 CLINICAL DATA:  Crohn disease, recent obstruction. Abdominal pain and vomiting. EXAM: DG ABDOMEN ACUTE WITH 1 VIEW CHEST COMPARISON:  06/13/2010. FINDINGS: Frontal view of the chest shows midline trachea and normal heart size. Pleuroparenchymal scarring in the left hemithorax, new from 06/13/2010. Lungs are otherwise clear. Markedly distended loops of small bowel in the upper abdomen with associated air-fluid levels. No colonic gas. Sutures are seen in the right lower quadrant. IVC filter in place. No free air. IMPRESSION: 1. High-grade small bowel obstruction.  No free air. 2. Pleuroparenchymal scarring in the left hemithorax. Electronically Signed   By: Leanna Battles M.D.   On: 01/24/2023 13:01      Letha Cape 01/26/2023  Patient ID: Tara Berger, female   DOB: 20-May-1971, 52 y.o.   MRN: [id number]

## 2023-01-26 NOTE — Progress Notes (Signed)
PROGRESS NOTE    Tara Berger  ZOX:[id number] DOB: 1971-01-15 DOA: 01/24/2023 PCP: Pcp, No     Brief Narrative:  Tara Berger is a 52 y.o. 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 She returned to the emergency department 01/24/23 with complaints of abdominal distention, abdominal pain, nausea and multiple episodes of emesis over the last 2 weeks.  She was able to tolerate drinking fluids until yesterday.  She has been having some loose stools, but no BM in the last 2 days.  At time of admission, imaging revealed high-grade small bowel obstruction.  Patient was admitted to the hospital and general surgery was consulted.  New events last 24 hours / Subjective: Admits to abdominal pain, but doing better. No nausea or vomiting. No flatus or BM.   Assessment & Plan:   Principal Problem:   SBO (small bowel obstruction) (HCC) Active Problems:   Crohn's disease (HCC)   Essential hypertension   Left hemiparesis (HCC)   Pulmonary embolism on right (HCC)   Tobacco use disorder   Small bowel obstruction -General surgery following -N.p.o., IV fluid  Crohn's disease -GI following   Hypertension -Hold antihypertensives in setting of n.p.o. status, low blood pressures -BP stable today   Chronic left hemiparesis following stroke -PT signed off   History of PE, DVT -Has been noncompliant with Eliquis -Lovenox  Tobacco use -Nicotine patch    DVT  prophylaxis: Lovenox   Code Status: Full code Family Communication: None at bedside Disposition Plan: Home Status is: Inpatient Remains inpatient appropriate because: IV fluid, surgical plan     Antimicrobials:  Anti-infectives (From admission, onward)    None        Objective: Vitals:   01/25/23 0951 01/25/23 1346 01/25/23 2025 01/26/23 0539  BP: 90/69 114/75 112/77 104/64  Pulse: 89 63 81 79  Resp: 16 14 16 17   Temp: 98 F (36.7 C)  98.5 F (36.9 C) 98.4 F (36.9 C)  TempSrc: Oral  Oral Oral  SpO2: (!) 86% 100% 99% 97%  Weight:      Height:        Intake/Output Summary (Last 24 hours) at 01/26/2023 1105 Last data filed at 01/26/2023 0956 Gross per 24 hour  Intake 2014.17 ml  Output 700 ml  Net 1314.17 ml    Filed Weights   01/24/23 1124  Weight: 59.9 kg    Examination:  General exam: Appears calm  Respiratory system: Clear to auscultation. Respiratory effort normal. No respiratory distress. No conversational dyspnea.  Cardiovascular system: S1 & S2 heard, RRR. No murmurs. No pedal edema. Gastrointestinal system: Abdomen is nondistended, soft.  No bowel sounds heard Skin: No rashes, lesions or ulcers on exposed skin  Psychiatry: Judgement and insight appear normal. Mood & affect appropriate.   Data Reviewed: I have personally reviewed following labs and imaging studies  CBC: Recent Labs  Lab 01/24/23 1240 01/25/23 0507 01/26/23 0450  WBC 8.8 7.4 6.3  NEUTROABS 6.9  --   --   HGB 11.1* 9.3* 10.3*  HCT 34.9* 28.9* 33.8*  MCV 88.8 89.5 93.1  PLT 270 208 187    Basic Metabolic Panel: Recent Labs  Lab 01/24/23 1240 01/25/23 0507 01/26/23 0450  NA 137 136 137  K 3.3* 4.5 4.1  CL 99 104 107  CO2 25 25 21*  GLUCOSE 102* 74 52*  BUN 18 13 11   CREATININE 0.86 0.75 0.66  CALCIUM 8.0* 7.7* 7.8*  MG 1.6* 2.1  --   PHOS 4.7*  --   --     GFR: Estimated Creatinine Clearance: 65.8 mL/min (by C-G formula based on SCr of 0.66 mg/dL). Liver  Function Tests: Recent Labs  Lab 01/24/23 1240 01/25/23 0507  AST 40 26  ALT 29 23  ALKPHOS 101 74  BILITOT 1.2 0.6  PROT 6.7 5.5*  ALBUMIN 2.9* 2.2*    Recent Labs  Lab 01/24/23 1240  LIPASE 22    No results for input(s): "AMMONIA" in the last 168 hours. Coagulation Profile: No results for input(s): "INR", "PROTIME" in the last 168 hours. Cardiac Enzymes: No results for input(s): "CKTOTAL", "CKMB", "CKMBINDEX", "TROPONINI" in the last 168 hours. BNP (last 3 results) No results for input(s): "PROBNP" in the last 8760 hours. HbA1C: No results for input(s): "HGBA1C" in the last 72 hours. CBG: No results for input(s): "GLUCAP" in the last 168 hours. Lipid Profile: No results for input(s): "CHOL", "HDL", "LDLCALC", "TRIG", "CHOLHDL", "LDLDIRECT" in the last 72 hours. Thyroid Function Tests: No results for input(s): "TSH", "T4TOTAL", "FREET4", "T3FREE", "THYROIDAB" in the last 72 hours. Anemia Panel: No results for input(s): "VITAMINB12", "FOLATE", "FERRITIN", "TIBC", "IRON", "RETICCTPCT" in the last 72 hours. Sepsis Labs: Recent Labs  Lab 01/24/23 1452  LATICACIDVEN 0.9     Recent Results (from the past 240 hour(s))  Blood culture (routine x 2)     Status: None (Preliminary result)   Collection Time: 01/24/23  2:52 PM   Specimen: BLOOD  Result Value Ref Range Status   Specimen Description   Final    BLOOD LEFT ANTECUBITAL Performed at [facility], [address], [place], [place] [address]    Special Requests   Final    BOTTLES DRAWN AEROBIC AND ANAEROBIC Blood Culture adequate volume Performed at [facility], [address], [place], [place] [address]    Culture   Final    NO GROWTH 2 DAYS Performed at [facility], [address], [place], [place] [address]    Report Status PENDING  Incomplete  Culture, blood (Routine X 2) w Reflex to ID Panel     Status: None (Preliminary result)   Collection Time: 01/24/23   9:12 PM   Specimen: BLOOD LEFT HAND  Result Value Ref Range Status   Specimen Description   Final    BLOOD LEFT HAND Performed at [facility], [address] [place], [place] [address]    Special Requests   Final    BOTTLES DRAWN AEROBIC AND ANAEROBIC BOTTLES DRAWN AEROBIC ONLY Performed at [facility], [address] [place], [place] [address]    Culture   Final    NO GROWTH 2 DAYS Performed at [facility], [address] [place], [place] [address]    Report Status PENDING  Incomplete      Radiology Studies: CT ABDOMEN PELVIS W CONTRAST  Result Date: 01/25/2023 CLINICAL DATA:  Crohn's exacerbation. Small bowel obstruction seen on abdominal series. EXAM: CT ABDOMEN AND PELVIS WITH CONTRAST TECHNIQUE: Multidetector CT imaging of the abdomen and pelvis was performed using the  standard protocol following bolus administration of intravenous contrast. RADIATION DOSE REDUCTION: This exam was performed according to the departmental dose-optimization program which includes automated exposure control, adjustment of the mA and/or kV according to patient size and/or use of iterative reconstruction technique. CONTRAST:  OMNIPAQUE IOHEXOL 300 MG/ML  SOLN COMPARISON:  Plain films 01/24/2023 FINDINGS: Lower chest: No acute abnormality. Hepatobiliary: Diffuse low-density throughout the liver compatible with fatty infiltration. No focal abnormality. Gallbladder unremarkable. Pancreas: No focal abnormality or ductal dilatation. Spleen: No focal abnormality.  Normal size. Adrenals/Urinary Tract: No adrenal abnormality. No focal renal abnormality. No stones or hydronephrosis. Urinary bladder is unremarkable. Stomach/Bowel: Postoperative changes in the right lower quadrant. Markedly dilated small bowel into the pelvis. Distal small bowel is decompressed and grossly unremarkable. No evidence for active Crohn's disease. Findings compatible with distal high-grade small bowel  obstruction, possibly related to stricture or adhesions. Stomach unremarkable. Vascular/Lymphatic: Aortic atherosclerosis. No evidence of aneurysm or adenopathy. IVC filter in place. Reproductive: Uterus and adnexa unremarkable.  No mass. Other: No free fluid or free air. Musculoskeletal: No acute bony abnormality. IMPRESSION: High-grade distal small-bowel obstruction. Distal ileum is decompressed and grossly unremarkable. No evidence for active Crohn's disease. Obstruction may be related to stricture or adhesions. Fatty liver. Aortic atherosclerosis. Electronically Signed   By: Charlett Nose M.D.   On: 01/25/2023 00:29   DG ABD ACUTE 2+V W 1V CHEST  Result Date: 01/24/2023 CLINICAL DATA:  Crohn disease, recent obstruction. Abdominal pain and vomiting. EXAM: DG ABDOMEN ACUTE WITH 1 VIEW CHEST COMPARISON:  06/13/2010. FINDINGS: Frontal view of the chest shows midline trachea and normal heart size. Pleuroparenchymal scarring in the left hemithorax, new from 06/13/2010. Lungs are otherwise clear. Markedly distended loops of small bowel in the upper abdomen with associated air-fluid levels. No colonic gas. Sutures are seen in the right lower quadrant. IVC filter in place. No free air. IMPRESSION: 1. High-grade small bowel obstruction.  No free air. 2. Pleuroparenchymal scarring in the left hemithorax. Electronically Signed   By: Leanna Battles M.D.   On: 01/24/2023 13:01      Scheduled Meds:  enoxaparin (LOVENOX) injection  1 mg/kg Subcutaneous BID   nicotine  14 mg Transdermal Daily   pantoprazole (PROTONIX) IV  40 mg Intravenous Daily   Continuous Infusions:  lactated ringers 125 mL/hr at 01/26/23 0111     LOS: 2 days   Time spent: 25 minutes   Noralee Stain, DO [facility] 01/26/2023, 11:05 AM   Available via Epic secure chat 7am-7pm After these hours, please refer to coverage provider listed on [email or web address]

## 2023-01-27 ENCOUNTER — Inpatient Hospital Stay (HOSPITAL_COMMUNITY): Payer: Medicaid Other

## 2023-01-27 ENCOUNTER — Encounter (HOSPITAL_COMMUNITY): Payer: Self-pay

## 2023-01-27 DIAGNOSIS — K56609 Unspecified intestinal obstruction, unspecified as to partial versus complete obstruction: Secondary | ICD-10-CM | POA: Diagnosis not present

## 2023-01-27 LAB — GLUCOSE, CAPILLARY
Glucose-Capillary: 32 mg/dL — CL (ref 70–99)
Glucose-Capillary: 143 mg/dL — ABNORMAL HIGH (ref 70–99)

## 2023-01-27 LAB — CULTURE, BLOOD (ROUTINE X 2): Culture: NO GROWTH

## 2023-01-27 MED ORDER — DEXTROSE 50 % IV SOLN
INTRAVENOUS | Status: AC
  Filled 2023-01-27: qty 50

## 2023-01-27 MED ORDER — MENTHOL 3 MG MT LOZG
1.0000 | LOZENGE | OROMUCOSAL | Status: DC | PRN
  Administered 2023-01-27: 3 mg via ORAL

## 2023-01-27 MED ORDER — DEXTROSE-SODIUM CHLORIDE 5-0.45 % IV SOLN: INTRAVENOUS | Status: AC

## 2023-01-27 MED ORDER — DEXTROSE 50 % IV SOLN
25.0000 g | INTRAVENOUS | Status: AC
  Administered 2023-01-27: 25 g via INTRAVENOUS

## 2023-01-27 MED ORDER — MORPHINE SULFATE (PF) 2 MG/ML IV SOLN
1.0000 mg | INTRAVENOUS | Status: DC | PRN
  Administered 2023-01-27 (×3): 1 mg via INTRAVENOUS
  Administered 2023-01-28 (×2): 2 mg via INTRAVENOUS
  Filled 2023-01-27 (×5): qty 1

## 2023-01-27 NOTE — Progress Notes (Signed)
PROGRESS NOTE    Tara Berger  HQI:[id number] DOB: 06-09-1971 DOA: 01/24/2023 PCP: Pcp, No     Brief Narrative:  Tara Berger is a 52 y.o. 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 She returned to the emergency department 01/24/23 with complaints of abdominal distention, abdominal pain, nausea and multiple episodes of emesis over the last 2 weeks.  She was able to tolerate drinking fluids until yesterday.  She has been having some loose stools, but no BM in the last 2 days.  At time of admission, imaging revealed high-grade small bowel obstruction.  Patient was admitted to the hospital and general surgery was consulted.  New events last 24 hours / Subjective: No new issues.  No nausea or vomiting, no flatus or BM.  Continues to have some abdominal pain.  Awaiting morphine  Assessment & Plan:   Principal Problem:   SBO (small bowel obstruction) (HCC) Active Problems:   Crohn's disease (HCC)   Essential hypertension   Left hemiparesis (HCC)   Pulmonary embolism on right (HCC)   Tobacco use disorder   Small bowel obstruction -General surgery following -N.p.o., IV fluid -Awaiting surgical plan/timing  Crohn's disease -GI following   Hypertension -Hold antihypertensives in setting of n.p.o. status, low blood pressures -BP stable today   Chronic left hemiparesis following stroke -PT signed off   History of PE, DVT -Has been noncompliant with  Eliquis -Lovenox  Tobacco use -Nicotine patch    DVT prophylaxis: Lovenox   Code Status: Full code Family Communication: None at bedside Disposition Plan: Home Status is: Inpatient Remains inpatient appropriate because: IV fluid, surgical plan     Antimicrobials:  Anti-infectives (From admission, onward)    None        Objective: Vitals:   01/26/23 1328 01/26/23 2034 01/27/23 0515 01/27/23 0900  BP: 119/76 132/87 99/65 92/60   Pulse: 70 82 72 74  Resp: 16 18 18 18   Temp: 98.1 F (36.7 C) 99.2 F (37.3 C) 97.9 F (36.6 C)   TempSrc:  Oral Oral   SpO2: 95% 98% 98%   Weight:      Height:        Intake/Output Summary (Last 24 hours) at 01/27/2023 1057 Last data filed at 01/27/2023 0900 Gross per 24 hour  Intake 1988.21 ml  Output 2700 ml  Net -711.79 ml    Filed Weights   01/24/23 1124  Weight: 59.9 kg    Examination:  General exam: Appears calm  Respiratory system: Clear to auscultation. Respiratory effort normal. No respiratory distress. No conversational dyspnea.  Cardiovascular system: S1 & S2 heard, RRR. No murmurs. No pedal edema. Gastrointestinal system: Abdomen is nondistended, soft.  No bowel sounds heard Skin: No rashes, lesions or ulcers on exposed skin  Psychiatry: Judgement and insight appear normal. Mood & affect appropriate.   Data Reviewed: I have personally reviewed following labs and imaging studies  CBC: Recent Labs  Lab 01/24/23 1240 01/25/23 0507 01/26/23 0450  WBC 8.8 7.4 6.3  NEUTROABS 6.9  --   --   HGB 11.1* 9.3* 10.3*  HCT 34.9*  28.9* 33.8*  MCV 88.8 89.5 93.1  PLT 270 208 187    Basic Metabolic Panel: Recent Labs  Lab 01/24/23 1240 01/25/23 0507 01/26/23 0450  NA 137 136 137  K 3.3* 4.5 4.1  CL 99 104 107  CO2 25 25 21*  GLUCOSE 102* 74 52*  BUN 18 13 11   CREATININE 0.86 0.75 0.66  CALCIUM 8.0* 7.7* 7.8*  MG 1.6* 2.1  --   PHOS 4.7*  --   --     GFR: Estimated Creatinine Clearance: 65.8 mL/min (by  C-G formula based on SCr of 0.66 mg/dL). Liver Function Tests: Recent Labs  Lab 01/24/23 1240 01/25/23 0507  AST 40 26  ALT 29 23  ALKPHOS 101 74  BILITOT 1.2 0.6  PROT 6.7 5.5*  ALBUMIN 2.9* 2.2*    Recent Labs  Lab 01/24/23 1240  LIPASE 22    No results for input(s): "AMMONIA" in the last 168 hours. Coagulation Profile: No results for input(s): "INR", "PROTIME" in the last 168 hours. Cardiac Enzymes: No results for input(s): "CKTOTAL", "CKMB", "CKMBINDEX", "TROPONINI" in the last 168 hours. BNP (last 3 results) No results for input(s): "PROBNP" in the last 8760 hours. HbA1C: No results for input(s): "HGBA1C" in the last 72 hours. CBG: No results for input(s): "GLUCAP" in the last 168 hours. Lipid Profile: No results for input(s): "CHOL", "HDL", "LDLCALC", "TRIG", "CHOLHDL", "LDLDIRECT" in the last 72 hours. Thyroid Function Tests: No results for input(s): "TSH", "T4TOTAL", "FREET4", "T3FREE", "THYROIDAB" in the last 72 hours. Anemia Panel: No results for input(s): "VITAMINB12", "FOLATE", "FERRITIN", "TIBC", "IRON", "RETICCTPCT" in the last 72 hours. Sepsis Labs: Recent Labs  Lab 01/24/23 1452  LATICACIDVEN 0.9     Recent Results (from the past 240 hour(s))  Blood culture (routine x 2)     Status: None (Preliminary result)   Collection Time: 01/24/23  2:52 PM   Specimen: BLOOD  Result Value Ref Range Status   Specimen Description   Final    BLOOD LEFT ANTECUBITAL Performed at [facility], [address], [place], [place] [address]    Special Requests   Final    BOTTLES DRAWN AEROBIC AND ANAEROBIC Blood Culture adequate volume Performed at [facility], [address] [place], [place] [address]    Culture   Final    NO GROWTH 3 DAYS Performed at [facility], [address], [place], [place] [address]    Report Status PENDING  Incomplete  Culture, blood (Routine X 2) w Reflex to ID Panel     Status: None  (Preliminary result)   Collection Time: 01/24/23  9:12 PM   Specimen: BLOOD LEFT HAND  Result Value Ref Range Status   Specimen Description   Final    BLOOD LEFT HAND Performed at [facility], [address] [place], [place] [address]    Special Requests   Final    BOTTLES DRAWN AEROBIC AND ANAEROBIC BOTTLES DRAWN AEROBIC ONLY Performed at [facility], [address], [place], [place] [address]    Culture   Final    NO GROWTH 3 DAYS Performed at [facility], [address] [place], [place] [address]    Report Status PENDING  Incomplete      Radiology Studies: DG Abd Portable 1V  Result Date: 01/27/2023 CLINICAL DATA:  Small-bowel obstruction EXAM: PORTABLE ABDOMEN - 1 VIEW COMPARISON:  CT abdomen pelvis 01/25/2023 FINDINGS: Redemonstrated markedly gaseous distended loops small bowel  within the central abdomen measuring up to 5 cm. No definite free intraperitoneal air. Oral contrast material within the colon. IVC filter. Lumbar spine degenerative changes. Left nephrolithiasis. IMPRESSION: Marked gaseous distended loops of small bowel within the central abdomen measuring up to 5 cm, compatible with small-bowel obstruction. Electronically Signed   By: Annia Belt M.D.   On: 01/27/2023 08:16      Scheduled Meds:  enoxaparin (LOVENOX) injection  1 mg/kg Subcutaneous BID   nicotine  14 mg Transdermal Daily   pantoprazole (PROTONIX) IV  40 mg Intravenous Daily   Continuous Infusions:  lactated ringers 125 mL/hr at 01/27/23 0836     LOS: 3 days   Time spent: 25 minutes   Noralee Stain, DO [facility] 01/27/2023, 10:57 AM   Available via Epic secure chat 7am-7pm After these hours, please refer to coverage provider listed on [email or web address]

## 2023-01-27 NOTE — Progress Notes (Signed)
Mobility Specialist - Progress Note   01/27/23 0901  Mobility  Activity Ambulated with assistance in hallway  Level of Assistance Standby assist, set-up cues, supervision of patient - no hands on  Assistive Device Front wheel walker  Distance Ambulated (ft) 500 ft  Range of Motion/Exercises Active  Activity Response Tolerated well  Mobility Referral Yes  $Mobility charge 1 Mobility  Mobility Specialist Start Time (ACUTE ONLY) 0847  Mobility Specialist Stop Time (ACUTE ONLY) 0900  Mobility Specialist Time Calculation (min) (ACUTE ONLY) 13 min   Pt was found in bed and agreeable to ambulate. Requires verbal cues when going from STS and to get back in bed. At EOS was left in bed with all needs met. Call bell in reach and bed alarm on.  Billey Chang Mobility Specialist

## 2023-01-27 NOTE — Progress Notes (Signed)
Assessment & Plan: HD#4 - small bowel obstruction, Crohn's disease  - continue NPO, IVF  - CT scan reviewed - appears to have distal SBO without active Crohn's  - GI saw and felt surgery may be next best option.  Agree with Dr Lorenso Quarry.  Does not seem to be due to inflammatory process  - encouraged OOB, ambulation  - no bowel function yet, but states she feels better.  Clinically stable.  No nausea or emesis, no flatus or BM's. Abd soft without tenderness.  Appears to have distal SBO on CT scan.  Likely due to adhesions or stricture related to prior surgical resections for Crohn's disease.  I believe she'll require ex lap with lysis of adhesions versus small bowel resection.  Discussed with patient, who is agreeable.  GI tends to agree with possible surgical intervention.  Will discuss possible timing with oncoming surgeon, possibly Mon       Tara Panda, MD  [facility] practice Office: [phone]        Chief Complaint: SBO  Subjective: Patient comfortable, in bed.  Denies flatus or BM.  Denies nausea or emesis.  Having less pain  Objective: Vital signs in last 24 hours: Temp:  [97.9 F (36.6 C)-99.2 F (37.3 C)] 97.9 F (36.6 C) (06/15 0515) Pulse Rate:  [70-82] 72 (06/15 0515) Resp:  [16-18] 18 (06/15 0515) BP: (99-132)/(65-87) 99/65 (06/15 0515) SpO2:  [95 %-98 %] 98 % (06/15 0515) Last BM Date : 01/24/23  Intake/Output from previous day: 06/14 0701 - 06/15 0700 In: 2849.6 [I.V.:2849.6] Out: 2550 [Urine:2550] Intake/Output this shift: No intake/output data recorded.  Physical Exam: HEENT - sclerae clear, mucous membranes moist Abdomen - soft, minimal distension; non-tender; well healed midline wound Ext - no edema, non-tender  Lab Results:  Recent Labs    01/25/23 0507 01/26/23 0450  WBC 7.4 6.3  HGB 9.3* 10.3*  HCT 28.9* 33.8*  PLT 208 187    BMET Recent Labs    01/25/23 0507 01/26/23 0450  NA 136 137  K 4.5  4.1  CL 104 107  CO2 25 21*  GLUCOSE 74 52*  BUN 13 11  CREATININE 0.75 0.66  CALCIUM 7.7* 7.8*    PT/INR No results for input(s): "LABPROT", "INR" in the last 72 hours. Comprehensive Metabolic Panel:    Component Value Date/Time   NA 137 01/26/2023 0450   NA 136 01/25/2023 0507   K 4.1 01/26/2023 0450   K 4.5 01/25/2023 0507   CL 107 01/26/2023 0450   CL 104 01/25/2023 0507   CO2 21 (L) 01/26/2023 0450   CO2 25 01/25/2023 0507   BUN 11 01/26/2023 0450   BUN 13 01/25/2023 0507   CREATININE 0.66 01/26/2023 0450   CREATININE 0.75 01/25/2023 0507   GLUCOSE 52 (L) 01/26/2023 0450   GLUCOSE 74 01/25/2023 0507   CALCIUM 7.8 (L) 01/26/2023 0450   CALCIUM 7.7 (L) 01/25/2023 0507   AST 26 01/25/2023 0507   AST 40 01/24/2023 1240   ALT 23 01/25/2023 0507   ALT 29 01/24/2023 1240   ALKPHOS 74 01/25/2023 0507   ALKPHOS 101 01/24/2023 1240   BILITOT 0.6 01/25/2023 0507   BILITOT 1.2 01/24/2023 1240   PROT 5.5 (L) 01/25/2023 0507   PROT 6.7 01/24/2023 1240   ALBUMIN 2.2 (L) 01/25/2023 0507   ALBUMIN 2.9 (L) 01/24/2023 1240    Studies/Results: No results found.    Tara Berger 01/27/2023  Patient ID: Tara Berger,  female   DOB: [birth year], 52 y.o.   MRN: [id number]

## 2023-01-27 NOTE — Progress Notes (Signed)
[facility] Progress Note  SUBJECTIVE:   Interval history: Panzy Waln was seen and evaluated today at bedside. Resting comfortably in bed. Denied any return of bowel function, no flatus, no bowel movement. No nausea or vomiting. On IV fluids. Denied chest pain and shortness of breath. No fevers.    History reviewed. No pertinent past medical history. History reviewed. No pertinent surgical history. Current Facility-Administered Medications  Medication Dose Route Frequency Provider Last Rate Last Admin   acetaminophen (TYLENOL) tablet 650 mg  650 mg Oral Q6H PRN Bobette Mo, MD       Or   acetaminophen (TYLENOL) suppository 650 mg  650 mg Rectal Q6H PRN Bobette Mo, MD       enoxaparin (LOVENOX) injection 60 mg  1 mg/kg Subcutaneous BID Danford Bad, RPH   60 mg at 01/27/23 7782   lactated ringers infusion   Intravenous Continuous Noralee Stain, DO 125 mL/hr at 01/27/23 0836 New Bag at 01/27/23 0836   menthol-cetylpyridinium (CEPACOL) lozenge 3 mg  1 lozenge Oral PRN Noralee Stain, DO   3 mg at 01/27/23 0834   morphine (PF) 2 MG/ML injection 1-2 mg  1-2 mg Intravenous Q3H PRN Noralee Stain, DO   1 mg at 01/27/23 1010   nicotine (NICODERM CQ - dosed in mg/24 hours) patch 14 mg  14 mg Transdermal Daily Bobette Mo, MD   14 mg at 01/27/23 0838   pantoprazole (PROTONIX) injection 40 mg  40 mg Intravenous Daily Bobette Mo, MD   40 mg at 01/27/23 0835   prochlorperazine (COMPAZINE) injection 10 mg  10 mg Intravenous Q6H PRN Bobette Mo, MD       Allergies as of 01/24/2023 - Review Complete 01/24/2023  Allergen Reaction Noted   Latex     Review of Systems:  Review of Systems  Constitutional:  Negative for fever.  Respiratory:  Negative for shortness of breath.   Cardiovascular:  Negative for chest pain.  Gastrointestinal:  Positive for abdominal pain (periumbilical). Negative for nausea and vomiting.       No flatus. No bowel  movement.    OBJECTIVE:   Temp:  [97.9 F (36.6 C)-99.2 F (37.3 C)] 97.9 F (36.6 C) (06/15 0515) Pulse Rate:  [70-82] 74 (06/15 0900) Resp:  [16-18] 18 (06/15 0900) BP: (92-132)/(60-87) 92/60 (06/15 0900) SpO2:  [95 %-98 %] 98 % (06/15 0515) Last BM Date : 01/24/23 Physical Exam Constitutional:      General: She is not in acute distress.    Appearance: She is not ill-appearing, toxic-appearing or diaphoretic.  Cardiovascular:     Rate and Rhythm: Normal rate and regular rhythm.  Pulmonary:     Effort: No respiratory distress.     Breath sounds: Normal breath sounds.     Comments: No supplemental oxygen in place. Abdominal:     General: Bowel sounds are normal. There is no distension.     Palpations: Abdomen is soft.     Tenderness: There is abdominal tenderness (supra-umbilical). There is no guarding.  Musculoskeletal:     Right lower leg: No edema.     Left lower leg: Edema present.  Skin:    General: Skin is warm and dry.  Neurological:     Mental Status: She is alert.     Labs: Recent Labs    01/24/23 1240 01/25/23 0507 01/26/23 0450  WBC 8.8 7.4 6.3  HGB 11.1* 9.3* 10.3*  HCT 34.9* 28.9* 33.8*  PLT 270 208 187  BMET Recent Labs    01/24/23 1240 01/25/23 0507 01/26/23 0450  NA 137 136 137  K 3.3* 4.5 4.1  CL 99 104 107  CO2 25 25 21*  GLUCOSE 102* 74 52*  BUN 18 13 11   CREATININE 0.86 0.75 0.66  CALCIUM 8.0* 7.7* 7.8*   LFT Recent Labs    01/25/23 0507  PROT 5.5*  ALBUMIN 2.2*  AST 26  ALT 23  ALKPHOS 74  BILITOT 0.6   PT/INR No results for input(s): "LABPROT", "INR" in the last 72 hours. Diagnostic imaging: DG Abd Portable 1V  Result Date: 01/27/2023 CLINICAL DATA:  Small-bowel obstruction EXAM: PORTABLE ABDOMEN - 1 VIEW COMPARISON:  CT abdomen pelvis 01/25/2023 FINDINGS: Redemonstrated markedly gaseous distended loops small bowel within the central abdomen measuring up to 5 cm. No definite free intraperitoneal air. Oral contrast  material within the colon. IVC filter. Lumbar spine degenerative changes. Left nephrolithiasis. IMPRESSION: Marked gaseous distended loops of small bowel within the central abdomen measuring up to 5 cm, compatible with small-bowel obstruction. Electronically Signed   By: Annia Belt M.D.   On: 01/27/2023 08:16    IMPRESSION: Stricturing small bowel Crohn's disease -Disease related surgeries including multiple small bowel resections (x2 per patient) -Has previously failed anti-TNF therapy, plan per last outside hospital report was to attempt ustekinumab in the outpatient setting, this has not yet been started -Colonoscopy 03/31/16 ileum biopsies showed active ileitis, proximal and distal colon biopsies unremarkable Distal small bowel stricture, small bowel obstruction -SBO likely fibrotic tissue rather than disease related inflammation at this point -General Surgery following and planning surgical intervention, final timing TBD History CVA   PLAN: -Holding steroids as likely fibrotic stenosis rather than inflammation at this point -Has plans to start biologic therapy in outpatient setting with primary GI team (through [facility]), agree with this  -SBO management per General Surgery -Surgery timing per General Surgery  -Fecal calprotectin when bowel function resumes -[facility] will follow peripherally until surgery completed (possibly 01/29/23)   LOS: 3 days   Liliane Shi, [facility]

## 2023-01-27 NOTE — Progress Notes (Signed)
Hypoglycemic Event  CBG: 32  Treatment: D50 50 mL (25 gm)  Symptoms: Shaky and nauseated  Follow-up CBG: Time:1357 CBG Result:143  Possible Reasons for Event: Other: NPO status X 3 days  Comments/MD notified:IVF's changed per order    Diona Browner

## 2023-01-28 ENCOUNTER — Other Ambulatory Visit: Payer: Self-pay

## 2023-01-28 DIAGNOSIS — K56609 Unspecified intestinal obstruction, unspecified as to partial versus complete obstruction: Secondary | ICD-10-CM | POA: Diagnosis not present

## 2023-01-28 LAB — CULTURE, BLOOD (ROUTINE X 2): Special Requests: ADEQUATE

## 2023-01-28 LAB — GLUCOSE, CAPILLARY: Glucose-Capillary: 149 mg/dL — ABNORMAL HIGH (ref 70–99)

## 2023-01-28 MED ORDER — INSULIN ASPART 100 UNIT/ML IJ SOLN
0.0000 [IU] | Freq: Four times a day (QID) | INTRAMUSCULAR | Status: DC
  Administered 2023-01-29 (×2): 1 [IU] via SUBCUTANEOUS

## 2023-01-28 MED ORDER — TRAVASOL 10 % IV SOLN
INTRAVENOUS | Status: AC
  Filled 2023-01-28: qty 480

## 2023-01-28 MED ORDER — SODIUM CHLORIDE 0.9 % IV SOLN
2.0000 g | INTRAVENOUS | Status: AC
  Administered 2023-01-29: 2 g via INTRAVENOUS
  Filled 2023-01-28: qty 2

## 2023-01-28 MED ORDER — CHLORHEXIDINE GLUCONATE CLOTH 2 % EX PADS
6.0000 | MEDICATED_PAD | Freq: Every day | CUTANEOUS | Status: DC
  Administered 2023-01-28 – 2023-02-07 (×11): 6 via TOPICAL

## 2023-01-28 MED ORDER — SODIUM CHLORIDE 0.9% FLUSH: 10.0000 mL | INTRAVENOUS | Status: DC | PRN

## 2023-01-28 MED ORDER — SODIUM CHLORIDE 0.9% FLUSH
10.0000 mL | Freq: Two times a day (BID) | INTRAVENOUS | Status: DC
  Administered 2023-01-28 – 2023-02-04 (×9): 10 mL

## 2023-01-28 MED ORDER — DEXTROSE-SODIUM CHLORIDE 5-0.45 % IV SOLN: INTRAVENOUS | Status: AC

## 2023-01-28 NOTE — Progress Notes (Signed)
PROGRESS NOTE    Tara Berger  WUJ:[id number] DOB: 1971-01-25 DOA: 01/24/2023 PCP: Pcp, No     Brief Narrative:  Tara Berger is a 52 y.o. 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 She returned to the emergency department 01/24/23 with complaints of abdominal distention, abdominal pain, nausea and multiple episodes of emesis over the last 2 weeks.  She was able to tolerate drinking fluids until yesterday.  She has been having some loose stools, but no BM in the last 2 days.  At time of admission, imaging revealed high-grade small bowel obstruction.  Patient was admitted to the hospital and general surgery was consulted.  New events last 24 hours / Subjective: Passing gas this morning.  No nausea or vomiting.  Had episode of hypoglycemia yesterday afternoon.  Assessment & Plan:   Principal Problem:   SBO (small bowel obstruction) (HCC) Active Problems:   Crohn's disease (HCC)   Essential hypertension   Left hemiparesis (HCC)   Pulmonary embolism on right (HCC)   Tobacco use disorder   Small bowel obstruction -General surgery following -N.p.o., IV fluid -Starting on TPN -Awaiting surgical plan/timing  Crohn's disease -GI following   Hypertension -Hold antihypertensives in setting of n.p.o. status, low blood pressures -BP stable today   Chronic left hemiparesis following stroke -PT signed off   History of PE, DVT -Has been noncompliant with  Eliquis -Lovenox  Tobacco use -Nicotine patch    DVT prophylaxis: Lovenox   Code Status: Full code Family Communication: None at bedside Disposition Plan: Home Status is: Inpatient Remains inpatient appropriate because: IV fluid, surgical plan     Antimicrobials:  Anti-infectives (From admission, onward)    Start     Dose/Rate Route Frequency Ordered Stop   01/29/23 0800  cefoTEtan (CEFOTAN) 2 g in sodium chloride 0.9 % 100 mL IVPB        2 g 200 mL/hr over 30 Minutes Intravenous On call to O.R. 01/28/23 0815 01/30/23 0559        Objective: Vitals:   01/27/23 0900 01/27/23 1324 01/27/23 2015 01/28/23 0528  BP: 92/60 126/88 99/63 99/68   Pulse: 74 82 87 84  Resp: 18 18 18 18   Temp:  98.6 F (37 C) 98.9 F (37.2 C) 98.5 F (36.9 C)  TempSrc:  Oral Oral Oral  SpO2:  99% 100% 99%  Weight:      Height:        Intake/Output Summary (Last 24 hours) at 01/28/2023 1147 Last data filed at 01/28/2023 0651 Gross per 24 hour  Intake 2062.11 ml  Output 1400 ml  Net 662.11 ml    Filed Weights   01/24/23 1124  Weight: 59.9 kg    Examination:  General exam: Appears calm  Respiratory system: Clear to auscultation. Respiratory effort normal. No respiratory distress. No conversational dyspnea.  Cardiovascular system: S1 & S2 heard, RRR. No murmurs. No pedal edema. Gastrointestinal system: Abdomen is nondistended, soft.  + bowel sounds heard Skin: No rashes, lesions or ulcers on exposed skin  Psychiatry: Judgement and insight appear normal. Mood & affect appropriate.  Data Reviewed: I have personally reviewed following labs and imaging studies  CBC: Recent Labs  Lab 01/24/23 1240 01/25/23 0507 01/26/23 0450  WBC 8.8 7.4 6.3  NEUTROABS 6.9  --   --   HGB 11.1* 9.3* 10.3*  HCT 34.9* 28.9* 33.8*  MCV 88.8 89.5 93.1  PLT 270 208 187    Basic Metabolic Panel: Recent Labs  Lab 01/24/23 1240 01/25/23 0507 01/26/23 0450  NA 137 136 137  K 3.3* 4.5 4.1  CL  99 104 107  CO2 25 25 21*  GLUCOSE 102* 74 52*  BUN 18 13 11   CREATININE 0.86 0.75 0.66  CALCIUM 8.0* 7.7* 7.8*  MG 1.6* 2.1  --   PHOS 4.7*  --   --     GFR: Estimated Creatinine Clearance: 65.8 mL/min (by C-G formula based on SCr of 0.66 mg/dL). Liver Function Tests: Recent Labs  Lab 01/24/23 1240 01/25/23 0507  AST 40 26  ALT 29 23  ALKPHOS 101 74  BILITOT 1.2 0.6  PROT 6.7 5.5*  ALBUMIN 2.9* 2.2*    Recent Labs  Lab 01/24/23 1240  LIPASE 22    No results for input(s): "AMMONIA" in the last 168 hours. Coagulation Profile: No results for input(s): "INR", "PROTIME" in the last 168 hours. Cardiac Enzymes: No results for input(s): "CKTOTAL", "CKMB", "CKMBINDEX", "TROPONINI" in the last 168 hours. BNP (last 3 results) No results for input(s): "PROBNP" in the last 8760 hours. HbA1C: No results for input(s): "HGBA1C" in the last 72 hours. CBG: Recent Labs  Lab 01/27/23 1326 01/27/23 1357  GLUCAP 32* 143*   Lipid Profile: No results for input(s): "CHOL", "HDL", "LDLCALC", "TRIG", "CHOLHDL", "LDLDIRECT" in the last 72 hours. Thyroid Function Tests: No results for input(s): "TSH", "T4TOTAL", "FREET4", "T3FREE", "THYROIDAB" in the last 72 hours. Anemia Panel: No results for input(s): "VITAMINB12", "FOLATE", "FERRITIN", "TIBC", "IRON", "RETICCTPCT" in the last 72 hours. Sepsis Labs: Recent Labs  Lab 01/24/23 1452  LATICACIDVEN 0.9     Recent Results (from the past 240 hour(s))  Blood culture (routine x 2)     Status: None (Preliminary result)   Collection Time: 01/24/23  2:52 PM   Specimen: BLOOD  Result Value Ref Range Status   Specimen Description   Final    BLOOD LEFT ANTECUBITAL Performed at [facility], [address] [place], [place] [address]    Special Requests   Final    BOTTLES DRAWN AEROBIC AND ANAEROBIC Blood Culture adequate volume Performed at [facility], [address] [place], [place] [address]     Culture   Final    NO GROWTH 4 DAYS Performed at [facility], [address], [place], [place] [address]    Report Status PENDING  Incomplete  Culture, blood (Routine X 2) w Reflex to ID Panel     Status: None (Preliminary result)   Collection Time: 01/24/23  9:12 PM   Specimen: BLOOD LEFT HAND  Result Value Ref Range Status   Specimen Description   Final    BLOOD LEFT HAND Performed at [facility], [address], [place], [place] [address]    Special Requests   Final    BOTTLES DRAWN AEROBIC AND ANAEROBIC BOTTLES DRAWN AEROBIC ONLY Performed at [facility], [address] [place], [place] [address]    Culture   Final    NO GROWTH 4 DAYS Performed at [facility], [address] [place], [place]  [address]    Report Status PENDING  Incomplete      Radiology Studies: [place] EKG SITE RITE  Result Date: 01/28/2023 If Site Rite image not attached, placement could not be confirmed due to current cardiac rhythm.  DG Abd Portable 1V  Result Date: 01/27/2023 CLINICAL DATA:  Small-bowel obstruction EXAM: PORTABLE ABDOMEN - 1 VIEW COMPARISON:  CT abdomen pelvis 01/25/2023 FINDINGS: Redemonstrated markedly gaseous distended loops small bowel within the central abdomen measuring up to 5 cm. No definite free intraperitoneal air. Oral contrast material within the colon. IVC filter. Lumbar spine degenerative changes. Left nephrolithiasis. IMPRESSION: Marked gaseous distended loops of small bowel within the central abdomen measuring up to 5 cm, compatible with small-bowel obstruction. Electronically Signed   By: Annia Belt M.D.   On: 01/27/2023 08:16      Scheduled Meds:  enoxaparin (LOVENOX) injection  1 mg/kg Subcutaneous BID   nicotine  14 mg Transdermal Daily   pantoprazole (PROTONIX) IV  40 mg Intravenous Daily   Continuous Infusions:  [START ON 01/29/2023] cefoTEtan (CEFOTAN) IV     dextrose 5 % and 0.45 % NaCl 125 mL/hr at 01/28/23 0541      LOS: 4 days   Time spent: 25 minutes   Noralee Stain, DO [facility] 01/28/2023, 11:47 AM   Available via Epic secure chat 7am-7pm After these hours, please refer to coverage provider listed on [email or web address]

## 2023-01-28 NOTE — Progress Notes (Signed)
PHARMACY - TOTAL PARENTERAL NUTRITION CONSULT NOTE   Indication: Small bowel obstruction  Patient Measurements: Height: 5\' 2"  (157.5 cm) Weight: 59.9 kg (132 lb) IBW/kg (Calculated) : 50.1 TPN AdjBW (KG): 59.9 Body mass index is 24.14 kg/m. Usual Weight:   Assessment:  Pharmacy is consulted to start TPN on 52 yo female diagnosed with bowel obstruction. PMH includes extensive history of stricturing small bowel Crohn's disease.  Recent admission for SBO with NGT placement and plans for surgery  but pt left AMA before surgery done. Tentative plans for surgery on 6/17.   Glucose / Insulin:  - no hx of DM  - Glucose levels on BMP have ranged from 52-102  Electrolytes:  - Electrolytes WNL including corrected calcium which is 9.2. CO2 slightly low at 21  Renal:  - SCr< 1  - BUN 11  Hepatic:  - LFTs WNL  - Bilirubin, lipase WNL  Intake / Output; MIVF:  - D5 1/2NS at 125 ml/hr  - positive 1179 mL on 6/15 GI Imaging: - 6/13: High-grade distal small-bowel obstruction  GI Surgeries / Procedures:  - Planned surgery on 6/17   Central access: PICC to be placed  TPN start date: 6/16   Nutritional Goals: Goal TPN rate is --- mL/hr (provides --- g of protein and --- kcals per day)  RD Assessment:   - pending   Current Nutrition:  NPO  Plan:  Start TPN at 40 mL/hr at 1800 Electrolytes in TPN:  Na 68mEq/L  K 13mEq/L  Ca 72mEq/L Mg 41mEq/L Phos 56mmol/L  Cl:Ac 1:2 Add standard MVI and trace elements to TPN Initiate Sensitive q6h SSI and adjust as needed  Reduce MIVF to 85 mL/hr at 1800 Monitor TPN labs on Mon/Thurs    Adalberto Cole, PharmD, BCPS 01/28/2023 11:35 AM

## 2023-01-28 NOTE — Progress Notes (Signed)
Peripherally Inserted Central Catheter Placement  The IV Nurse has discussed with the patient and/or persons authorized to consent for the patient, the purpose of this procedure and the potential benefits and risks involved with this procedure.  The benefits include less needle sticks, lab draws from the catheter, and the patient may be discharged home with the catheter. Risks include, but not limited to, infection, bleeding, blood clot (thrombus formation), and puncture of an artery; nerve damage and irregular heartbeat and possibility to perform a PICC exchange if needed/ordered by physician.  Alternatives to this procedure were also discussed.  Bard Power PICC patient education guide, fact sheet on infection prevention and patient information card has been provided to patient /or left at bedside.    PICC Placement Documentation  PICC Double Lumen 01/28/23 Left Basilic 38 cm 0 cm (Active)  Indication for Insertion or Continuance of Line Administration of hyperosmolar/irritating solutions (i.e. TPN, Vancomycin, etc.) 01/28/23 1729  Exposed Catheter (cm) 0 cm 01/28/23 1729  Site Assessment Clean, Dry, Intact 01/28/23 1729  Lumen #1 Status Flushed;Saline locked;Blood return noted 01/28/23 1729  Lumen #2 Status Flushed;Saline locked;Blood return noted 01/28/23 1729  Dressing Type Transparent;Securing device 01/28/23 1729  Dressing Status Antimicrobial disc in place;Clean, Dry, Intact 01/28/23 1729  Safety Lock Not Applicable 01/28/23 1729  Line Care Connections checked and tightened 01/28/23 1729  Line Adjustment (NICU/IV Team Only) No 01/28/23 1729  Dressing Intervention New dressing 01/28/23 1729  Dressing Change Due 02/04/23 01/28/23 1729       Elliot Dally 01/28/2023, 5:30 PM

## 2023-01-28 NOTE — Progress Notes (Signed)
Assessment & Plan: HD#5 - small bowel obstruction, Crohn's disease  - continue NPO, IVF  - CT scan reviewed - appears to have distal SBO without active Crohn's  - GI saw and felt surgery may be next best option.  Agree with Dr Lorenso Quarry.  Does not seem to be due to inflammatory process, most likely adhesive disease  - encouraged OOB, ambulation  - no bowel function yet, but states she feels better.  Clinically stable.  No nausea or emesis, no flatus or BM's. Abd soft without tenderness.  Appears to have distal SBO on CT scan.  Likely due to adhesions or stricture related to prior surgical resections for Crohn's disease.  I believe she'll require ex lap with lysis of adhesions versus small bowel resection.  Discussed with patient, who is agreeable.  Will tentatively plan for Mon, 6/17.  GI tends to agree with possible surgical intervention.         Vanita Panda, MD  [facility] practice Office: [phone]        Chief Complaint: SBO  Subjective: Patient comfortable, in bed.  Denies flatus or BM.  Denies nausea or emesis.  Having less pain  Objective: Vital signs in last 24 hours: Temp:  [98.5 F (36.9 C)-98.9 F (37.2 C)] 98.5 F (36.9 C) (06/16 0528) Pulse Rate:  [74-87] 84 (06/16 0528) Resp:  [18] 18 (06/16 0528) BP: (92-126)/(60-88) 99/68 (06/16 0528) SpO2:  [99 %-100 %] 99 % (06/16 0528) Last BM Date : 01/24/23  Intake/Output from previous day: 06/15 0701 - 06/16 0700 In: 3029 [I.V.:3029] Out: 1850 [Urine:1850] Intake/Output this shift: No intake/output data recorded.  Physical Exam: HEENT - sclerae clear, mucous membranes moist Abdomen - soft, minimal distension; non-tender; well healed midline wound Ext - no edema, non-tender  Lab Results:  Recent Labs    01/26/23 0450  WBC 6.3  HGB 10.3*  HCT 33.8*  PLT 187    BMET Recent Labs    01/26/23 0450  NA 137  K 4.1  CL 107  CO2 21*  GLUCOSE 52*  BUN 11  CREATININE  0.66  CALCIUM 7.8*    PT/INR No results for input(s): "LABPROT", "INR" in the last 72 hours. Comprehensive Metabolic Panel:    Component Value Date/Time   NA 137 01/26/2023 0450   NA 136 01/25/2023 0507   K 4.1 01/26/2023 0450   K 4.5 01/25/2023 0507   CL 107 01/26/2023 0450   CL 104 01/25/2023 0507   CO2 21 (L) 01/26/2023 0450   CO2 25 01/25/2023 0507   BUN 11 01/26/2023 0450   BUN 13 01/25/2023 0507   CREATININE 0.66 01/26/2023 0450   CREATININE 0.75 01/25/2023 0507   GLUCOSE 52 (L) 01/26/2023 0450   GLUCOSE 74 01/25/2023 0507   CALCIUM 7.8 (L) 01/26/2023 0450   CALCIUM 7.7 (L) 01/25/2023 0507   AST 26 01/25/2023 0507   AST 40 01/24/2023 1240   ALT 23 01/25/2023 0507   ALT 29 01/24/2023 1240   ALKPHOS 74 01/25/2023 0507   ALKPHOS 101 01/24/2023 1240   BILITOT 0.6 01/25/2023 0507   BILITOT 1.2 01/24/2023 1240   PROT 5.5 (L) 01/25/2023 0507   PROT 6.7 01/24/2023 1240   ALBUMIN 2.2 (L) 01/25/2023 0507   ALBUMIN 2.9 (L) 01/24/2023 1240    Studies/Results: [place] EKG SITE RITE  Result Date: 01/28/2023 If Site Rite image not attached, placement could not be confirmed due to current cardiac rhythm.  DG Abd Portable  1V  Result Date: 01/27/2023 CLINICAL DATA:  Small-bowel obstruction EXAM: PORTABLE ABDOMEN - 1 VIEW COMPARISON:  CT abdomen pelvis 01/25/2023 FINDINGS: Redemonstrated markedly gaseous distended loops small bowel within the central abdomen measuring up to 5 cm. No definite free intraperitoneal air. Oral contrast material within the colon. IVC filter. Lumbar spine degenerative changes. Left nephrolithiasis. IMPRESSION: Marked gaseous distended loops of small bowel within the central abdomen measuring up to 5 cm, compatible with small-bowel obstruction. Electronically Signed   By: Annia Belt M.D.   On: 01/27/2023 08:16      Vanita Panda 01/28/2023  Patient ID: Tara Berger, female   DOB: 1971-01-07, 52 y.o.   MRN: [id number]

## 2023-01-28 NOTE — Progress Notes (Signed)
Mobility Specialist - Progress Note   01/28/23 0909  Mobility  Activity Ambulated with assistance to bathroom  Level of Assistance Contact guard assist, steadying assist  Assistive Device Front wheel walker  Distance Ambulated (ft) 5 ft  Activity Response Tolerated well  Mobility Referral Yes  $Mobility charge 1 Mobility  Mobility Specialist Start Time (ACUTE ONLY) 0845  Mobility Specialist Stop Time (ACUTE ONLY) 0905  Mobility Specialist Time Calculation (min) (ACUTE ONLY) 20 min   Pt received in bed and agreeable to ambulate. Prior to ambulation, pt requested assistance to bathroom. Pt was MinA from STS & contact due to unsteadiness. Pt stated "I don't know what's going on with me" in terms of balance. Nurse made aware. Pt to bed after session with all needs met. Bed alarm on.   [facility]

## 2023-01-28 NOTE — Progress Notes (Signed)
PICC order received, line will be placed today for TPN. 

## 2023-01-29 ENCOUNTER — Inpatient Hospital Stay (HOSPITAL_COMMUNITY): Payer: Medicaid Other | Admitting: Anesthesiology

## 2023-01-29 ENCOUNTER — Encounter (HOSPITAL_COMMUNITY): Payer: Self-pay

## 2023-01-29 ENCOUNTER — Encounter (HOSPITAL_COMMUNITY)
Admission: EM | Disposition: A | Discharge status: Home Health | Payer: Self-pay | Source: Home / Self Care | Attending: Internal Medicine

## 2023-01-29 ENCOUNTER — Other Ambulatory Visit: Payer: Self-pay

## 2023-01-29 ENCOUNTER — Inpatient Hospital Stay (HOSPITAL_COMMUNITY): Payer: Medicaid Other

## 2023-01-29 DIAGNOSIS — I10 Essential (primary) hypertension: Secondary | ICD-10-CM

## 2023-01-29 DIAGNOSIS — D649 Anemia, unspecified: Secondary | ICD-10-CM

## 2023-01-29 DIAGNOSIS — K565 Intestinal adhesions [bands], unspecified as to partial versus complete obstruction: Secondary | ICD-10-CM | POA: Diagnosis not present

## 2023-01-29 DIAGNOSIS — F17210 Nicotine dependence, cigarettes, uncomplicated: Secondary | ICD-10-CM | POA: Diagnosis not present

## 2023-01-29 DIAGNOSIS — K56609 Unspecified intestinal obstruction, unspecified as to partial versus complete obstruction: Secondary | ICD-10-CM | POA: Diagnosis not present

## 2023-01-29 HISTORY — PX: LAPAROTOMY: SHX154

## 2023-01-29 LAB — CULTURE, BLOOD (ROUTINE X 2): Culture: NO GROWTH

## 2023-01-29 LAB — CBC
HCT: 31.7 % — ABNORMAL LOW (ref 36.0–46.0)
Hemoglobin: 10.2 g/dL — ABNORMAL LOW (ref 12.0–15.0)
MCH: 28.9 pg (ref 26.0–34.0)
MCHC: 32.2 g/dL (ref 30.0–36.0)
MCV: 89.8 fL (ref 80.0–100.0)
Platelets: 130 10*3/uL — ABNORMAL LOW (ref 150–400)
RBC: 3.53 MIL/uL — ABNORMAL LOW (ref 3.87–5.11)
RDW: 15.2 % (ref 11.5–15.5)
WBC: 5.5 10*3/uL (ref 4.0–10.5)
nRBC: 0 % (ref 0.0–0.2)

## 2023-01-29 LAB — PHOSPHORUS: Phosphorus: 3.7 mg/dL (ref 2.5–4.6)

## 2023-01-29 LAB — COMPREHENSIVE METABOLIC PANEL
ALT: 25 U/L (ref 0–44)
AST: 36 U/L (ref 15–41)
Albumin: 1.7 g/dL — ABNORMAL LOW (ref 3.5–5.0)
Alkaline Phosphatase: 56 U/L (ref 38–126)
Anion gap: 5 (ref 5–15)
BUN: 6 mg/dL (ref 6–20)
CO2: 26 mmol/L (ref 22–32)
Calcium: 6.8 mg/dL — ABNORMAL LOW (ref 8.9–10.3)
Chloride: 106 mmol/L (ref 98–111)
Creatinine, Ser: 0.64 mg/dL (ref 0.44–1.00)
GFR, Estimated: >60 mL/min (ref >60)
Glucose, Bld: 121 mg/dL — ABNORMAL HIGH (ref 70–99)
Potassium: 2.8 mmol/L — ABNORMAL LOW (ref 3.5–5.1)
Sodium: 137 mmol/L (ref 135–145)
Total Bilirubin: 0.6 mg/dL (ref 0.3–1.2)
Total Protein: 4.3 g/dL — ABNORMAL LOW (ref 6.5–8.1)

## 2023-01-29 LAB — GLUCOSE, CAPILLARY
Glucose-Capillary: 133 mg/dL — ABNORMAL HIGH (ref 70–99)
Glucose-Capillary: 241 mg/dL — ABNORMAL HIGH (ref 70–99)
Glucose-Capillary: 290 mg/dL — ABNORMAL HIGH (ref 70–99)

## 2023-01-29 LAB — TRIGLYCERIDES: Triglycerides: 71 mg/dL (ref <150)

## 2023-01-29 LAB — MAGNESIUM: Magnesium: 1 mg/dL — ABNORMAL LOW (ref 1.7–2.4)

## 2023-01-29 SURGERY — LAPAROTOMY, EXPLORATORY
Anesthesia: General

## 2023-01-29 MED ORDER — LIDOCAINE HCL (PF) 2 % IJ SOLN
INTRAMUSCULAR | Status: AC
  Filled 2023-01-29: qty 5

## 2023-01-29 MED ORDER — KETAMINE HCL 10 MG/ML IJ SOLN
INTRAMUSCULAR | Status: DC | PRN
  Administered 2023-01-29: 20 mg via INTRAVENOUS

## 2023-01-29 MED ORDER — ONDANSETRON HCL 4 MG/2ML IJ SOLN
INTRAMUSCULAR | Status: AC
  Filled 2023-01-29: qty 2

## 2023-01-29 MED ORDER — MAGNESIUM SULFATE 2 GM/50ML IV SOLN
2.0000 g | Freq: Once | INTRAVENOUS | Status: AC
  Administered 2023-01-29: 2 g via INTRAVENOUS
  Filled 2023-01-29: qty 50

## 2023-01-29 MED ORDER — POTASSIUM CHLORIDE 10 MEQ/100ML IV SOLN
10.0000 meq | INTRAVENOUS | Status: DC
  Administered 2023-01-29: 10 meq via INTRAVENOUS
  Filled 2023-01-29: qty 100

## 2023-01-29 MED ORDER — KETAMINE HCL 50 MG/5ML IJ SOSY
PREFILLED_SYRINGE | INTRAMUSCULAR | Status: AC
  Filled 2023-01-29: qty 5

## 2023-01-29 MED ORDER — PROPOFOL 10 MG/ML IV BOLUS
INTRAVENOUS | Status: DC | PRN
  Administered 2023-01-29: 90 mg via INTRAVENOUS

## 2023-01-29 MED ORDER — ONDANSETRON HCL 4 MG/2ML IJ SOLN
INTRAMUSCULAR | Status: DC | PRN
  Administered 2023-01-29: 4 mg via INTRAVENOUS

## 2023-01-29 MED ORDER — CHLORHEXIDINE GLUCONATE 0.12 % MT SOLN
15.0000 mL | Freq: Once | OROMUCOSAL | Status: AC
  Administered 2023-01-29: 15 mL via OROMUCOSAL

## 2023-01-29 MED ORDER — INSULIN ASPART 100 UNIT/ML IJ SOLN
0.0000 [IU] | Freq: Three times a day (TID) | INTRAMUSCULAR | Status: DC
  Administered 2023-01-29: 5 [IU] via SUBCUTANEOUS
  Administered 2023-01-30: 3 [IU] via SUBCUTANEOUS

## 2023-01-29 MED ORDER — METHOCARBAMOL 1000 MG/10ML IJ SOLN
1000.0000 mg | Freq: Three times a day (TID) | INTRAVENOUS | Status: DC
  Administered 2023-01-29 – 2023-01-31 (×6): 1000 mg via INTRAVENOUS
  Filled 2023-01-29: qty 10
  Filled 2023-01-29 (×6): qty 1000

## 2023-01-29 MED ORDER — NALOXONE HCL 0.4 MG/ML IJ SOLN: 0.4000 mg | INTRAMUSCULAR | Status: DC | PRN

## 2023-01-29 MED ORDER — HYDROMORPHONE HCL 1 MG/ML IJ SOLN
INTRAMUSCULAR | Status: DC | PRN
  Administered 2023-01-29 (×2): .5 mg via INTRAVENOUS

## 2023-01-29 MED ORDER — ONDANSETRON HCL 4 MG/2ML IJ SOLN
4.0000 mg | Freq: Four times a day (QID) | INTRAMUSCULAR | Status: DC | PRN
  Administered 2023-01-30: 4 mg via INTRAVENOUS
  Filled 2023-01-29 (×2): qty 2

## 2023-01-29 MED ORDER — BUPIVACAINE HCL (PF) 0.5 % IJ SOLN
INTRAMUSCULAR | Status: AC
  Filled 2023-01-29: qty 30

## 2023-01-29 MED ORDER — HYDROMORPHONE 1 MG/ML IV SOLN
INTRAVENOUS | Status: DC
  Administered 2023-01-29: 30 mg via INTRAVENOUS
  Administered 2023-01-29: 0.3 mg via INTRAVENOUS
  Administered 2023-01-30: 0.9 mg via INTRAVENOUS
  Administered 2023-01-30: 0.3 mg via INTRAVENOUS
  Administered 2023-01-30: 0.9 mg via INTRAVENOUS
  Administered 2023-01-30: 0.3 mg via INTRAVENOUS
  Administered 2023-01-30: 0.9 mg via INTRAVENOUS
  Administered 2023-01-30: 0.6 mg via INTRAVENOUS
  Administered 2023-01-31: 0.9 mg via INTRAVENOUS
  Administered 2023-01-31: 0.3 mg via INTRAVENOUS
  Administered 2023-01-31: 0.9 mg via INTRAVENOUS
  Administered 2023-01-31: 1.2 mg via INTRAVENOUS
  Administered 2023-01-31: 0.6 mg via INTRAVENOUS
  Administered 2023-02-01: 1.2 mg via INTRAVENOUS
  Administered 2023-02-01: 0.3 mg via INTRAVENOUS
  Administered 2023-02-01: 0.9 mg via INTRAVENOUS
  Administered 2023-02-02: 0.6 mg via INTRAVENOUS
  Filled 2023-01-29: qty 30

## 2023-01-29 MED ORDER — ROCURONIUM BROMIDE 10 MG/ML (PF) SYRINGE
PREFILLED_SYRINGE | INTRAVENOUS | Status: AC
  Filled 2023-01-29: qty 10

## 2023-01-29 MED ORDER — PROPOFOL 10 MG/ML IV BOLUS
INTRAVENOUS | Status: AC
  Filled 2023-01-29: qty 20

## 2023-01-29 MED ORDER — ALBUMIN HUMAN 5 % IV SOLN: INTRAVENOUS | Status: DC | PRN

## 2023-01-29 MED ORDER — MIDAZOLAM HCL 2 MG/2ML IJ SOLN
INTRAMUSCULAR | Status: AC
  Filled 2023-01-29: qty 2

## 2023-01-29 MED ORDER — MIDAZOLAM HCL 5 MG/5ML IJ SOLN
INTRAMUSCULAR | Status: DC | PRN
  Administered 2023-01-29: 2 mg via INTRAVENOUS

## 2023-01-29 MED ORDER — LACTATED RINGERS IV SOLN: INTRAVENOUS | Status: DC | PRN

## 2023-01-29 MED ORDER — ROCURONIUM BROMIDE 10 MG/ML (PF) SYRINGE
PREFILLED_SYRINGE | INTRAVENOUS | Status: DC | PRN
  Administered 2023-01-29: 50 mg via INTRAVENOUS
  Administered 2023-01-29: 20 mg via INTRAVENOUS

## 2023-01-29 MED ORDER — POTASSIUM CHLORIDE 10 MEQ/100ML IV SOLN
10.0000 meq | INTRAVENOUS | Status: AC
  Administered 2023-01-29: 10 meq via INTRAVENOUS

## 2023-01-29 MED ORDER — ALBUMIN HUMAN 5 % IV SOLN
INTRAVENOUS | Status: AC
  Filled 2023-01-29: qty 500

## 2023-01-29 MED ORDER — FENTANYL CITRATE (PF) 100 MCG/2ML IJ SOLN
INTRAMUSCULAR | Status: AC
  Filled 2023-01-29: qty 2

## 2023-01-29 MED ORDER — DIPHENHYDRAMINE HCL 12.5 MG/5ML PO ELIX: 12.5000 mg | ORAL_SOLUTION | Freq: Four times a day (QID) | ORAL | Status: DC | PRN

## 2023-01-29 MED ORDER — POTASSIUM CHLORIDE 10 MEQ/100ML IV SOLN
10.0000 meq | INTRAVENOUS | Status: DC
  Administered 2023-01-29 (×3): 10 meq via INTRAVENOUS
  Filled 2023-01-29 (×6): qty 100

## 2023-01-29 MED ORDER — PHENYLEPHRINE 80 MCG/ML (10ML) SYRINGE FOR IV PUSH (FOR BLOOD PRESSURE SUPPORT)
PREFILLED_SYRINGE | INTRAVENOUS | Status: DC | PRN
  Administered 2023-01-29: 120 ug via INTRAVENOUS
  Administered 2023-01-29 (×2): 160 ug via INTRAVENOUS

## 2023-01-29 MED ORDER — BUPIVACAINE HCL (PF) 0.5 % IJ SOLN
INTRAMUSCULAR | Status: DC | PRN
  Administered 2023-01-29: 20 mL

## 2023-01-29 MED ORDER — DEXAMETHASONE SODIUM PHOSPHATE 4 MG/ML IJ SOLN
INTRAMUSCULAR | Status: DC | PRN
  Administered 2023-01-29: 5 mg via INTRAVENOUS

## 2023-01-29 MED ORDER — TRAVASOL 10 % IV SOLN
INTRAVENOUS | Status: AC
  Filled 2023-01-29: qty 480

## 2023-01-29 MED ORDER — HYDROMORPHONE HCL 1 MG/ML IJ SOLN
INTRAMUSCULAR | Status: AC
  Filled 2023-01-29: qty 1

## 2023-01-29 MED ORDER — SUCCINYLCHOLINE CHLORIDE 200 MG/10ML IV SOSY
PREFILLED_SYRINGE | INTRAVENOUS | Status: AC
  Filled 2023-01-29: qty 10

## 2023-01-29 MED ORDER — AMISULPRIDE (ANTIEMETIC) 5 MG/2ML IV SOLN: 10.0000 mg | Freq: Once | INTRAVENOUS | Status: DC | PRN

## 2023-01-29 MED ORDER — 0.9 % SODIUM CHLORIDE (POUR BTL) OPTIME
TOPICAL | Status: DC | PRN
  Administered 2023-01-29: 3000 mL

## 2023-01-29 MED ORDER — DIPHENHYDRAMINE HCL 50 MG/ML IJ SOLN
12.5000 mg | Freq: Four times a day (QID) | INTRAMUSCULAR | Status: DC | PRN
  Administered 2023-02-01 – 2023-02-03 (×3): 12.5 mg via INTRAVENOUS
  Filled 2023-01-29 (×3): qty 1

## 2023-01-29 MED ORDER — DEXAMETHASONE SODIUM PHOSPHATE 10 MG/ML IJ SOLN
INTRAMUSCULAR | Status: AC
  Filled 2023-01-29: qty 1

## 2023-01-29 MED ORDER — LIDOCAINE 2% (20 MG/ML) 5 ML SYRINGE
INTRAMUSCULAR | Status: DC | PRN
  Administered 2023-01-29: 60 mg via INTRAVENOUS

## 2023-01-29 MED ORDER — FENTANYL CITRATE (PF) 100 MCG/2ML IJ SOLN
INTRAMUSCULAR | Status: DC | PRN
  Administered 2023-01-29: 100 ug via INTRAVENOUS
  Administered 2023-01-29: 50 ug via INTRAVENOUS
  Administered 2023-01-29: 100 ug via INTRAVENOUS
  Administered 2023-01-29 (×2): 50 ug via INTRAVENOUS

## 2023-01-29 MED ORDER — SODIUM CHLORIDE 0.9% FLUSH: 9.0000 mL | INTRAVENOUS | Status: DC | PRN

## 2023-01-29 MED ORDER — ACETAMINOPHEN 10 MG/ML IV SOLN
1000.0000 mg | Freq: Four times a day (QID) | INTRAVENOUS | Status: AC
  Administered 2023-01-29 – 2023-01-30 (×4): 1000 mg via INTRAVENOUS
  Filled 2023-01-29 (×4): qty 100

## 2023-01-29 MED ORDER — SUGAMMADEX SODIUM 200 MG/2ML IV SOLN
INTRAVENOUS | Status: DC | PRN
  Administered 2023-01-29: 200 mg via INTRAVENOUS

## 2023-01-29 MED ORDER — MAGNESIUM SULFATE 2 GM/50ML IV SOLN: 2.0000 g | Freq: Once | INTRAVENOUS | Status: DC

## 2023-01-29 MED ORDER — FENTANYL CITRATE PF 50 MCG/ML IJ SOSY: 25.0000 ug | PREFILLED_SYRINGE | INTRAMUSCULAR | Status: DC | PRN

## 2023-01-29 MED ORDER — PHENYLEPHRINE HCL-NACL 20-0.9 MG/250ML-% IV SOLN
INTRAVENOUS | Status: DC | PRN
  Administered 2023-01-29: 40 ug/min via INTRAVENOUS

## 2023-01-29 MED ORDER — THIAMINE HCL 100 MG/ML IJ SOLN
100.0000 mg | Freq: Every day | INTRAMUSCULAR | Status: AC
  Administered 2023-01-30 – 2023-02-03 (×5): 100 mg via INTRAVENOUS
  Filled 2023-01-29 (×5): qty 2

## 2023-01-29 MED ORDER — FENTANYL CITRATE (PF) 250 MCG/5ML IJ SOLN
INTRAMUSCULAR | Status: AC
  Filled 2023-01-29: qty 5

## 2023-01-29 SURGICAL SUPPLY — 38 items
APL PRP STRL LF DISP 70% ISPRP (MISCELLANEOUS) ×1
BAG COUNTER SPONGE SURGICOUNT (BAG) IMPLANT
BAG SPNG CNTER NS LX DISP (BAG)
BLADE EXTENDED COATED 6.5IN (ELECTRODE) IMPLANT
CHLORAPREP W/TINT 26 (MISCELLANEOUS) ×1 IMPLANT
COVER MAYO STAND STRL (DRAPES) ×1 IMPLANT
COVER SURGICAL LIGHT HANDLE (MISCELLANEOUS) ×1 IMPLANT
DRAPE LAPAROSCOPIC ABDOMINAL (DRAPES) ×1 IMPLANT
DRAPE WARM FLUID 44X44 (DRAPES) ×1 IMPLANT
DRSG OPSITE POSTOP 4X10 (GAUZE/BANDAGES/DRESSINGS) IMPLANT
ELECT REM PT RETURN 15FT ADLT (MISCELLANEOUS) ×1 IMPLANT
GLOVE BIO SURGEON STRL SZ7.5 (GLOVE) ×1 IMPLANT
GLOVE BIOGEL PI IND STRL 7.0 (GLOVE) ×1 IMPLANT
GOWN STRL REUS W/ TWL XL LVL3 (GOWN DISPOSABLE) ×1 IMPLANT
GOWN STRL REUS W/TWL XL LVL3 (GOWN DISPOSABLE) ×1
HANDLE SUCTION POOLE (INSTRUMENTS) ×1 IMPLANT
KIT BASIN OR (CUSTOM PROCEDURE TRAY) ×1 IMPLANT
KIT TURNOVER KIT A (KITS) IMPLANT
LIGASURE IMPACT 36 18CM CVD LR (INSTRUMENTS) IMPLANT
NS IRRIG 1000ML POUR BTL (IV SOLUTION) ×1 IMPLANT
PACK GENERAL/GYN (CUSTOM PROCEDURE TRAY) ×1 IMPLANT
RELOAD PROXIMATE 75MM BLUE (ENDOMECHANICALS) ×2 IMPLANT
RELOAD STAPLE 75 3.8 BLU REG (ENDOMECHANICALS) IMPLANT
SPONGE T-LAP 18X18 ~~LOC~~+RFID (SPONGE) IMPLANT
STAPLER GUN LINEAR PROX 60 (STAPLE) IMPLANT
STAPLER PROXIMATE 75MM BLUE (STAPLE) IMPLANT
STAPLER VISISTAT 35W (STAPLE) IMPLANT
SUCTION POOLE HANDLE (INSTRUMENTS) ×1
SUT PDS AB 1 TP1 96 (SUTURE) IMPLANT
SUT SILK 2 0 (SUTURE) ×1
SUT SILK 2 0 SH CR/8 (SUTURE) ×1 IMPLANT
SUT SILK 2-0 18XBRD TIE 12 (SUTURE) ×1 IMPLANT
SUT SILK 3 0 SH CR/8 (SUTURE) ×1 IMPLANT
SYR 20ML ECCENTRIC (SYRINGE) IMPLANT
TOWEL OR 17X26 10 PK STRL BLUE (TOWEL DISPOSABLE) ×1 IMPLANT
TRAY FOL W/BAG SLVR 16FR STRL (SET/KITS/TRAYS/PACK) IMPLANT
TRAY FOLEY MTR SLVR 16FR STAT (SET/KITS/TRAYS/PACK) ×1 IMPLANT
TRAY FOLEY W/BAG SLVR 16FR LF (SET/KITS/TRAYS/PACK) ×1

## 2023-01-29 NOTE — Progress Notes (Signed)
PROGRESS NOTE    Tara Berger  ZOX:[id number] DOB: 10/05/1970 DOA: 01/24/2023 PCP: Pcp, No     Brief Narrative:  Tara Berger is a 52 y.o. 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 She returned to the emergency department 01/24/23 with complaints of abdominal distention, abdominal pain, nausea and multiple episodes of emesis over the last 2 weeks.  She was able to tolerate drinking fluids until yesterday.  She has been having some loose stools, but no BM in the last 2 days.  At time of admission, imaging revealed high-grade small bowel obstruction.  Patient was admitted to the hospital and general surgery was consulted.  New events last 24 hours / Subjective: Had a small bowel movement yesterday and today.  No new complaints.  Agreeable to ambulate  Assessment & Plan:   Principal Problem:   SBO (small bowel obstruction) (HCC) Active Problems:   Crohn's disease (HCC)   Essential hypertension   Left hemiparesis (HCC)   Pulmonary embolism on right (HCC)   Tobacco use disorder   Small bowel obstruction -General surgery following -Started TPN 6/16 -OR for ex lap 6/17  Crohn's disease -GI following   Hypertension -Hold antihypertensives in setting of n.p.o. 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  Discussed with pharmacy today.  Patient was started on Eliquis in December 2023 after hospitalization following right ACA stroke and rheumatic mitral valve disease.  Cardiothoracic surgery was consulted for rheumatic valve management.  Holter monitor recommended for possible valvular A-fib.  She was then hospitalized in March 2024 with left DVT and PE.  At that time, she was switched to Coumadin.  Pharmacist discussed with patient today.  She denies ever taking Coumadin.  She takes Eliquis only on as-needed basis.  -Doubt patient would be compliant with Coumadin with frequent INR checks.  Need to address anticoagulation with her prior to discharge home, likely will discharge back on Eliquis  Tobacco use -Nicotine patch    DVT prophylaxis: Lovenox --> Eliquis when okay to resume anticoagulation per general surgery service   Code Status: Full code Family Communication: None at bedside Disposition Plan: Home Status is: Inpatient Remains inpatient appropriate because: OR today  Antimicrobials:  Anti-infectives (From admission, onward)    Start     Dose/Rate Route Frequency Ordered Stop   01/29/23 0800  [MAR Hold]  cefoTEtan (CEFOTAN) 2 g in sodium chloride 0.9 % 100 mL IVPB        (MAR Hold since Mon 01/29/2023 at 1100.Hold Reason: Transfer to a Procedural area)   2 g 200 mL/hr over 30 Minutes Intravenous On call to O.R. 01/28/23 0815 01/30/23 0559        Objective: Vitals:   01/28/23 1851 01/28/23 2029 01/29/23 0607 01/29/23 1112  BP: 109/78 127/87 103/73 120/81  Pulse: 88 99 78   Resp: 18 18 17 16   Temp: 99.2 F (37.3 C) 98.9 F (37.2 C) 98.9 F (37.2 C) 97.8 F (36.6  C)  TempSrc: Oral Oral Oral Oral  SpO2: 100% 93% (!) 82% 100%  Weight:    59.9 kg  Height:    5\' 2"  (1.575 m)    Intake/Output Summary (Last 24 hours) at 01/29/2023 1150 Last data filed at 01/29/2023 0941 Gross per 24 hour  Intake 1771.54 ml  Output 700 ml  Net 1071.54 ml    Filed  Weights   01/24/23 1124 01/29/23 1112  Weight: 59.9 kg 59.9 kg    Examination:  General exam: Appears calm  Respiratory system: Clear to auscultation. Respiratory effort normal. No respiratory distress. No conversational dyspnea.  Cardiovascular system: S1 & S2 heard, RRR. No murmurs. No pedal edema. Gastrointestinal system: Abdomen is nondistended, soft. Skin: No rashes, lesions or ulcers on exposed skin  Psychiatry: Judgement and insight appear normal. Mood & affect appropriate.   Data Reviewed: I have personally reviewed following labs and imaging studies  CBC: Recent Labs  Lab 01/24/23 1240 01/25/23 0507 01/26/23 0450 01/29/23 0958  WBC 8.8 7.4 6.3 5.5  NEUTROABS 6.9  --   --   --   HGB 11.1* 9.3* 10.3* 10.2*  HCT 34.9* 28.9* 33.8* 31.7*  MCV 88.8 89.5 93.1 89.8  PLT 270 208 187 130*    Basic Metabolic Panel: Recent Labs  Lab 01/24/23 1240 01/25/23 0507 01/26/23 0450 01/29/23 0418  NA 137 136 137 137  K 3.3* 4.5 4.1 2.8*  CL 99 104 107 106  CO2 25 25 21* 26  GLUCOSE 102* 74 52* 121*  BUN 18 13 11 6   CREATININE 0.86 0.75 0.66 0.64  CALCIUM 8.0* 7.7* 7.8* 6.8*  MG 1.6* 2.1  --  1.0*  PHOS 4.7*  --   --  3.7    GFR: Estimated Creatinine Clearance: 65.8 mL/min (by C-G formula based on SCr of 0.64 mg/dL). Liver Function Tests: Recent Labs  Lab 01/24/23 1240 01/25/23 0507 01/29/23 0418  AST 40 26 36  ALT 29 23 25   ALKPHOS 101 74 56  BILITOT 1.2 0.6 0.6  PROT 6.7 5.5* 4.3*  ALBUMIN 2.9* 2.2* 1.7*    Recent Labs  Lab 01/24/23 1240  LIPASE 22    No results for input(s): "AMMONIA" in the last 168 hours. Coagulation Profile: No results for input(s): "INR", "PROTIME" in the last 168 hours. Cardiac Enzymes: No results for input(s): "CKTOTAL", "CKMB", "CKMBINDEX", "TROPONINI" in the last 168 hours. BNP (last 3 results) No results for input(s): "PROBNP" in the last 8760 hours. HbA1C: No results for input(s): "HGBA1C" in the last 72  hours. CBG: Recent Labs  Lab 01/27/23 1326 01/27/23 1357 01/28/23 2333 01/29/23 0610  GLUCAP 32* 143* 149* 133*    Lipid Profile: Recent Labs    01/29/23 0418  TRIG 71   Thyroid Function Tests: No results for input(s): "TSH", "T4TOTAL", "FREET4", "T3FREE", "THYROIDAB" in the last 72 hours. Anemia Panel: No results for input(s): "VITAMINB12", "FOLATE", "FERRITIN", "TIBC", "IRON", "RETICCTPCT" in the last 72 hours. Sepsis Labs: Recent Labs  Lab 01/24/23 1452  LATICACIDVEN 0.9     Recent Results (from the past 240 hour(s))  Blood culture (routine x 2)     Status: None   Collection Time: 01/24/23  2:52 PM   Specimen: BLOOD  Result Value Ref Range Status   Specimen Description   Final    BLOOD LEFT ANTECUBITAL Performed at [facility], [address] [place], [place] [address]    Special Requests   Final    BOTTLES DRAWN AEROBIC AND  ANAEROBIC Blood Culture adequate volume Performed at [facility], [address], [place], [place] [address]    Culture   Final    NO GROWTH 5 DAYS Performed at [facility], [address] [place], [place] [address]    Report Status 01/29/2023 FINAL  Final  Culture, blood (Routine X 2) w Reflex to ID Panel     Status: None   Collection Time: 01/24/23  9:12 PM   Specimen: BLOOD LEFT HAND  Result Value Ref Range Status   Specimen Description   Final    BLOOD LEFT HAND Performed at [facility], [address] [place], [place] [address]    Special Requests   Final    BOTTLES DRAWN AEROBIC AND ANAEROBIC BOTTLES DRAWN AEROBIC ONLY Performed at [facility], [address], [place], [place] [address]    Culture   Final    NO GROWTH 5 DAYS Performed at [facility], [address] [place], [place] [address]    Report Status 01/29/2023 FINAL  Final      Radiology Studies: [place] EKG SITE RITE  Result Date: 01/28/2023 If Site Rite image not attached,  placement could not be confirmed due to current cardiac rhythm.     Scheduled Meds:  [MAR Hold] Chlorhexidine Gluconate Cloth  6 each Topical Daily   [MAR Hold] insulin aspart  0-9 Units Subcutaneous Q8H   [MAR Hold] nicotine  14 mg Transdermal Daily   [MAR Hold] pantoprazole (PROTONIX) IV  40 mg Intravenous Daily   [MAR Hold] sodium chloride flush  10-40 mL Intracatheter Q12H   [MAR Hold] thiamine (VITAMIN B1) injection  100 mg Intravenous Daily   Continuous Infusions:  [MAR Hold] cefoTEtan (CEFOTAN) IV     dextrose 5 % and 0.45 % NaCl 85 mL/hr at 01/29/23 0653   [MAR Hold] magnesium sulfate bolus IVPB     [MAR Hold] potassium chloride     TPN ADULT (ION) 40 mL/hr at 01/28/23 1734   TPN ADULT (ION)       LOS: 5 days   Time spent: 25 minutes   Noralee Stain, DO [facility] 01/29/2023, 11:50 AM   Available via Epic secure chat 7am-7pm After these hours, please refer to coverage provider listed on [email or web address]

## 2023-01-29 NOTE — Op Note (Signed)
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  Procedure(s): EXPLORATORY LAPAROTOMY; LYSIS OF ADHESIONS RESECTION OF ILEOCOLIC ANASTOMOSIS NEW ILEOCOLIC ANASTOMOSIS  Surgeon(s): Abigail Miyamoto, MD [name], PA-C   Anesthesia: General  Staff:  Circulator: Fredia Sorrow, RN Relief Circulator: Bolivar Haw, RN Scrub Person: Bascom Levels, CST  Estimated Blood Loss: Minimal               Specimens: SENT TO PATH  Indications: This is a 52 year old female with a prior history of Crohn's disease status post exploratory laparotomy x 2 and bowel resections who presents with a small bowel obstruction that has not improved with conservative management.  By CT scan, there is no evidence of ongoing Crohn's disease.  Decision was made to proceed to the operating room for an exploratory laparotomy  Findings: The patient found to have chronically dilated distal thick-walled small bowel.  She had a very tight stricture of her previous ileocolic anastomosis creating a small bowel obstruction.  The procedure included 1 hour of lysis of adhesions.  Procedure: The patient was brought to the operating identified as correct patient.  She was placed upon the operating table and general anesthesia was induced.  Her abdomen was then prepped and draped in the usual sterile fashion.  We created a midline incision from the umbilicus to the pubis through her previous scar.  We then carried this down through subcutaneous and fascia and then opened the knee and the entire length of the incision.  The patient was found to have chronically dilated distal small bowel with several loops going into the pelvis.  I had to extend the incision above the umbilicus.  Her proximal small bowel was normal and not dilated.  She did have a significantly reduced amount of small bowel from her previous  surgery.  We started freeing up the loops of small bowel stuck in the pelvis with the Metzenbaum scissors.  Small bowel was a densely adherent to the right ovary.  We were able to free this up.  We continued dissection with lysis of adhesions.  I did free up the omentum from the abdominal wall.  I was able to identify the transverse colon and follow this toward the more proximal ascending colon.  It became apparent that the patient had a previous ileocolic anastomosis and this was the point of stricturing.  After I freed up all the adhesions I was unable to milk any small bowel contents through the previous anastomosis.  At this point the decision was made to resect the anastomosis.  After further lysis of adhesions was able to identify an area of sending colon distal to the anastomosis and transected this with a GIA 75 stapler.  We were then able to transect the distal small bowel just proximal to the anastomosis with a GIA 75 stapler as well.  The mesentery was then taken down with the LigaSure as well as 2-0 silk sutures.  This completed the resection of the anastomosis which was sent to pathology for evaluation.  We then reapproximated the small bowel to the remaining ascending colon in a side-to-side fashion with silk sutures.  We performed a colotomy and enterotomy with the cautery and then created a side-to-side anastomosis with the GIA 75 stapler.  The opening was then closed with a TX 60 stapler.  I then reinforced the staple line with several interrupted 2-0 and 3-0  silk sutures.  I milked the small bowel contents from the proximal small bowel through the anastomosis and saw no evidence of leak.  The mesenteric defect was closed with a single 2-0 silk suture as well.  We then copiously irrigated the abdomen with normal saline.  Hemostasis appeared to be achieved.  We then changed gown and gloves and applied new sterile drapes.  We then closed the midline fascia with a running #1 looped PDS suture.  I  anesthetized the fascia with Marcaine.  We irrigated the skin with saline.  We then closed this incision with staples.  Honeycomb dressing was applied.  The patient tolerated the procedure well.  All the counts were correct at the end of the procedure.  The patient was then extubated in the operating room and taken in stable condition to the recovery room.          Abigail Miyamoto   Date: 01/29/2023  Time: 3:02 PM

## 2023-01-29 NOTE — Progress Notes (Signed)
   Subjective/Chief Complaint: Denies pain. Has had a small BM   Objective: Vital signs in last 24 hours: Temp:  [98.9 F (37.2 C)-99.2 F (37.3 C)] 98.9 F (37.2 C) (06/17 0607) Pulse Rate:  [78-99] 78 (06/17 0607) Resp:  [17-18] 17 (06/17 0607) BP: (103-127)/(73-87) 103/73 (06/17 0607) SpO2:  [82 %-100 %] 82 % (06/17 0607) Last BM Date : 01/29/23  Intake/Output from previous day: 06/16 0701 - 06/17 0700 In: 2694.2 [I.V.:2694.2] Out: 600 [Urine:600] Intake/Output this shift: No intake/output data recorded.  Exam: Awake and alert Comfortable Abdomen a little full, no peritoneal signs  Lab Results:  No results for input(s): "WBC", "HGB", "HCT", "PLT" in the last 72 hours. BMET Recent Labs    01/29/23 0418  NA 137  K 2.8*  CL 106  CO2 26  GLUCOSE 121*  BUN 6  CREATININE 0.64  CALCIUM 6.8*   PT/INR No results for input(s): "LABPROT", "INR" in the last 72 hours. ABG No results for input(s): "PHART", "HCO3" in the last 72 hours.  Invalid input(s): "PCO2", "PO2"  Studies/Results: [place] EKG SITE RITE  Result Date: 01/28/2023 If Site Rite image not attached, placement could not be confirmed due to current cardiac rhythm.   Anti-infectives: Anti-infectives (From admission, onward)    Start     Dose/Rate Route Frequency Ordered Stop   01/29/23 0800  cefoTEtan (CEFOTAN) 2 g in sodium chloride 0.9 % 100 mL IVPB        2 g 200 mL/hr over 30 Minutes Intravenous On call to O.R. 01/28/23 0815 01/30/23 0559       Assessment/Plan: HD#6- small bowel obstruction, Crohn's disease             - continue NPO, IVF             - CT scan reviewed - appears to have distal SBO without active Crohn's             - GI saw and felt surgery may be next best option.  Agree with Dr Lorenso Quarry.  Does not seem to be due to inflammatory process, most likely adhesive disease             - encouraged OOB, ambulation             - did have small BM  I have reviewed her notes and  scans and have discussed her with my partners who have seen her over the past few days.  This is likely a high grade SBO from adhesions or a stricture and surgery is recommended. I discussed continued conservative management again vs proceeding to the OR for an exploratory laparotomy.  I discussed the risks which includes but is not limited to bleeding, infection, injury to surrounding structures including the bowels, the need for a bowel resection, the need for further procedures, cardiopulmonary issues, postop recovery, etc. She agrees to proceed with surgery today   Abigail Miyamoto MD 01/29/2023

## 2023-01-29 NOTE — Anesthesia Procedure Notes (Signed)
Procedure Name: Intubation Date/Time: 01/29/2023 12:40 PM  Performed by: Vanessa Lincolnia, CRNAPre-anesthesia Checklist: Patient identified, Emergency Drugs available, Suction available and Patient being monitored Patient Re-evaluated:Patient Re-evaluated prior to induction Oxygen Delivery Method: Circle system utilized Preoxygenation: Pre-oxygenation with 100% oxygen Induction Type: IV induction Ventilation: Mask ventilation without difficulty Laryngoscope Size: 2 and Miller Grade View: Grade I Tube type: Oral Tube size: 7.0 mm Number of attempts: 1 Airway Equipment and Method: Stylet Placement Confirmation: ETT inserted through vocal cords under direct vision, positive ETCO2 and breath sounds checked- equal and bilateral Secured at: 20 cm Tube secured with: Tape Dental Injury: Teeth and Oropharynx as per pre-operative assessment

## 2023-01-29 NOTE — Anesthesia Preprocedure Evaluation (Signed)
Anesthesia Evaluation  Patient identified by MRN, date of birth, ID band Patient awake    Reviewed: Allergy & Precautions, NPO status , Patient's Chart, lab work & pertinent test results  Airway Mallampati: II  TM Distance: >3 FB Neck ROM: Full    Dental   Pulmonary Current Smoker and Patient abstained from smoking.   breath sounds clear to auscultation       Cardiovascular hypertension,  Rhythm:Regular Rate:Normal     Neuro/Psych CVA    GI/Hepatic Neg liver ROS,,,Crohns with SBO   Endo/Other  negative endocrine ROS    Renal/GU negative Renal ROS     Musculoskeletal   Abdominal   Peds  Hematology  (+) Blood dyscrasia, anemia   Anesthesia Other Findings   Reproductive/Obstetrics                             Anesthesia Physical Anesthesia Plan  ASA: 3 and emergent  Anesthesia Plan: General   Post-op Pain Management: Tylenol PO (pre-op)* and Ketamine IV*   Induction: Intravenous  PONV Risk Score and Plan: 2 and Dexamethasone and Treatment may vary due to age or medical condition  Airway Management Planned: Oral ETT  Additional Equipment:   Intra-op Plan:   Post-operative Plan: Extubation in OR  Informed Consent: I have reviewed the patients History and Physical, chart, labs and discussed the procedure including the risks, benefits and alternatives for the proposed anesthesia with the patient or authorized representative who has indicated his/her understanding and acceptance.     Dental advisory given  Plan Discussed with: CRNA  Anesthesia Plan Comments:        Anesthesia Quick Evaluation

## 2023-01-29 NOTE — Transfer of Care (Signed)
Immediate Anesthesia Transfer of Care Note  Patient: Tara Berger  Procedure(s) Performed: EXPLORATORY LAPAROTOMY; LYSIS OF ADHESIONS, RESECTION OF ILEOCOLIC ANASTOMOSIS, NEW ILEOCOLIC ANASTOMOSIS  Patient Location: PACU  Anesthesia Type:General  Level of Consciousness: awake and patient cooperative  Airway & Oxygen Therapy: Patient Spontanous Breathing and Patient connected to face mask  Post-op Assessment: Report given to RN and Post -op Vital signs reviewed and stable  Post vital signs: Reviewed and stable  Last Vitals:  Vitals Value Taken Time  BP 150/108 01/29/23 1515  Temp    Pulse    Resp 17 01/29/23 1517  SpO2    Vitals shown include unvalidated device data.  Last Pain:  Vitals:   01/29/23 1112  TempSrc: Oral  PainSc: 0-No pain      Patients Stated Pain Goal: 1 (01/27/23 2233)  Complications: No notable events documented.

## 2023-01-29 NOTE — Progress Notes (Signed)
Mobility Specialist - Progress Note   01/29/23 0926  Mobility  Activity Ambulated with assistance in hallway;Ambulated with assistance to bathroom  Level of Assistance Standby assist, set-up cues, supervision of patient - no hands on  Assistive Device Front wheel walker  Distance Ambulated (ft) 460 ft  Activity Response Tolerated well  Mobility Referral Yes  $Mobility charge 1 Mobility  Mobility Specialist Start Time (ACUTE ONLY) 0901  Mobility Specialist Stop Time (ACUTE ONLY) [id number]  Mobility Specialist Time Calculation (min) (ACUTE ONLY) 25 min   Pt received in bed and agreeable to mobility. Prior to ambulating, pt requested assistance to bathroom for BM. No complaints during session. Pt to bed after session with all needs met & nurse in room. Bed alarm on.   [facility]

## 2023-01-29 NOTE — Progress Notes (Signed)
PHARMACY - TOTAL PARENTERAL NUTRITION CONSULT NOTE   Indication: Small bowel obstruction  Patient Measurements: Height: 5\' 2"  (157.5 cm) Weight: 59.9 kg (132 lb) IBW/kg (Calculated) : 50.1 TPN AdjBW (KG): 59.9 Body mass index is 24.14 kg/m. Usual Weight:   Assessment:  Pharmacy is consulted to start TPN on 52 yo female admitted with abdominal pain, distention, and nausea with episodes of emesis over the past 2 weeks. Extensive history of small bowel stricturing and prior surgeries d/t Crohn's disease. CT shows distal SBO. Recent admission in March 2024 for SBO with NGT & TPN, plans for surgery, but pt left AMA. Now with tentative plans for surgery on 6/17.   Glucose / Insulin: no hx of DM  - CBGs remain at goal (100-150) on 1/2-rate TPN  Electrolytes:  - K, Mag both significantly lower today (possible refeeding?) - Ca & Phos also lower but remain WNL - Na, Cl, bicarb stable WNL Renal: SCr, BUN stable WNL - UOP adequate, unclear if fully charting Hepatic: LFTs, Tbili stable WNL - Albumin low and decreasing - TG WNL I/O: +6.8 L this admission (per charting) - no UGI output (no emesis, no NG present) - 1 liquid stool charted 6/16 (last prior BM 6/4) - D5-1/2NS at 85 ml/hr GI Imaging: - 6/13 CTa/p: high-grade distal small-bowel obstruction d/t stricture vs adhesions; no active Crohn's dz noted - 6/15 AXR: persistent SBO GI Surgeries / Procedures:  - Planned surgery on 6/17   Central access: PICC TPN start date: 6/16   Nutritional Goals: Awaiting RD recommendations (below) - Using 30 kcal/kg and 1.5 g/kg protein daily generates temporary goals of 1800 kcal and 90 g protein/day  RD Assessment:   - pending  Current Nutrition:  NPO  Plan:  KCl 10 mEq IV x 7 per MD Mag 2g IV x 1 per MD Will give an additional Mag 2g IV x 1 today (4g total) Start thiamine 100 mg IV daily x 6 given refeeding risk Reduce sensitive SSI to q8 hr CBG checks given good control Monitor TPN  labs on Mon/Thurs Bmet, Mag, Phos daily x 3 days given s/s refeeding  At 1800 today: Continue TPN at 40 mL/hr at 1800; hold off on advancing until electrolytes stabilize Electrolytes in TPN: decrease Ac content; no other changes today (replacing outside TPN while at half-rate) Na 29mEq/L  K 1mEq/L  Ca 98mEq/L Mg 57mEq/L Phos 60mmol/L  Cl:Ac 1:1 Add standard MVI and trace elements to TPN Continue MIVF at 85 mL/hr   Bernadene Person, PharmD, BCPS [phone] 01/29/2023, 10:41 AM

## 2023-01-29 NOTE — Progress Notes (Addendum)
Initial Nutrition Assessment  INTERVENTION:   Monitor magnesium, potassium, and phosphorus for at least 3 days, MD to replete as needed, as pt is at risk for refeeding syndrome.  -TPN management per Pharmacy -Agree with 100 mg Thiamine daily x 5 days given refeeding risk -Daily weights while on TPN   NUTRITION DIAGNOSIS:   Increased nutrient needs related to chronic illness (Crohn's disease, recurrent SBOs) as evidenced by estimated needs.  GOAL:   Patient will meet greater than or equal to 90% of their needs  MONITOR:   Labs, Weight trends, I & O's (TPN)  REASON FOR ASSESSMENT:   Consult New TPN/TNA  ASSESSMENT:   52 y.o. 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 She returned to the emergency department 01/24/23 with complaints of abdominal distention, abdominal pain, nausea and multiple episodes of emesis over the last 2 weeks.Admitted for high grade SBO.  Patient not in room at time of visit, in OR for planned ex lap.  Pt was recently admitted at [facility] in which she was started on TPN then given risk of short gut syndrome.  Pt with history of bowel resections in 2006 and 2011.  Per chart review, pt was not eating well for 2 weeks PTA d/t N/V. Pt has been NPO x 4 days since admission as well. Will be at refeeding risk.   TPN to continue at 40 ml/hr today (~1132 kcals, 48g protein). Pt showing signs of refeeding syndrome with labs.   Admission  weight: 132 lbs Today's weight copied over from 6/12. Daily weights have been ordered via TPN protocol. Last documented weight from 5/14: 121 lbs.  Medications: Thiamine, D5 infusion, IV Mg sulfate, IV KCl  Labs reviewed: CBGs: 32-149 Low K Low Mg   NUTRITION - FOCUSED PHYSICAL EXAM:  In OR, will attempt at follow-up  Diet Order:   Diet Order             Diet NPO time specified  Diet effective now                   EDUCATION NEEDS:   Not appropriate for education at this time  Skin:  Skin Assessment: Reviewed RN Assessment  Last BM:  6/16 -type 7  Height:   Ht Readings from Last 1 Encounters:  01/29/23 5\' 2"  (1.575 m)    Weight:   Wt Readings from Last 1 Encounters:  01/29/23 59.9 kg    BMI:  Body mass index is 24.14 kg/m.  Estimated Nutritional Needs:   Kcal:  1800-2000  Protein:  90-105g  Fluid:  2L/day   Tilda Franco, MS, RD, LDN Inpatient Clinical Dietitian Contact information available via Amion

## 2023-01-29 NOTE — Anesthesia Postprocedure Evaluation (Signed)
Anesthesia Post Note  Patient: Tara Berger  Procedure(s) Performed: EXPLORATORY LAPAROTOMY; LYSIS OF ADHESIONS, RESECTION OF ILEOCOLIC ANASTOMOSIS, NEW ILEOCOLIC ANASTOMOSIS     Patient location during evaluation: PACU Anesthesia Type: General Level of consciousness: awake and alert Pain management: pain level controlled Vital Signs Assessment: post-procedure vital signs reviewed and stable Respiratory status: spontaneous breathing, nonlabored ventilation, respiratory function stable and patient connected to nasal cannula oxygen Cardiovascular status: blood pressure returned to baseline and stable Postop Assessment: no apparent nausea or vomiting Anesthetic complications: no   No notable events documented.  Last Vitals:  Vitals:   01/29/23 1849 01/29/23 2003  BP: 118/74 115/84  Pulse: 85 (!) 57  Resp: 18 17  Temp: 37 C 36.9 C  SpO2: 100% 93%    Last Pain:  Vitals:   01/29/23 2003  TempSrc: Oral  PainSc:                  [name]

## 2023-01-30 ENCOUNTER — Encounter (HOSPITAL_COMMUNITY): Payer: Self-pay | Admitting: Surgery

## 2023-01-30 DIAGNOSIS — E440 Moderate protein-calorie malnutrition: Secondary | ICD-10-CM | POA: Insufficient documentation

## 2023-01-30 DIAGNOSIS — K56609 Unspecified intestinal obstruction, unspecified as to partial versus complete obstruction: Secondary | ICD-10-CM | POA: Diagnosis not present

## 2023-01-30 LAB — PHOSPHORUS: Phosphorus: 3.4 mg/dL (ref 2.5–4.6)

## 2023-01-30 LAB — CBC
HCT: 22.1 % — ABNORMAL LOW (ref 36.0–46.0)
Hemoglobin: 7.2 g/dL — ABNORMAL LOW (ref 12.0–15.0)
MCH: 28.7 pg (ref 26.0–34.0)
MCHC: 32.6 g/dL (ref 30.0–36.0)
MCV: 88 fL (ref 80.0–100.0)
Platelets: 98 10*3/uL — ABNORMAL LOW (ref 150–400)
RBC: 2.51 MIL/uL — ABNORMAL LOW (ref 3.87–5.11)
RDW: 15.1 % (ref 11.5–15.5)
WBC: 8.7 10*3/uL (ref 4.0–10.5)
nRBC: 0 % (ref 0.0–0.2)

## 2023-01-30 LAB — GLUCOSE, CAPILLARY
Glucose-Capillary: 108 mg/dL — ABNORMAL HIGH (ref 70–99)
Glucose-Capillary: 143 mg/dL — ABNORMAL HIGH (ref 70–99)
Glucose-Capillary: 143 mg/dL — ABNORMAL HIGH (ref 70–99)
Glucose-Capillary: 220 mg/dL — ABNORMAL HIGH (ref 70–99)

## 2023-01-30 LAB — BASIC METABOLIC PANEL
Anion gap: 4 — ABNORMAL LOW (ref 5–15)
BUN: 9 mg/dL (ref 6–20)
CO2: 22 mmol/L (ref 22–32)
Calcium: 6.9 mg/dL — ABNORMAL LOW (ref 8.9–10.3)
Chloride: 107 mmol/L (ref 98–111)
Creatinine, Ser: 0.68 mg/dL (ref 0.44–1.00)
GFR, Estimated: >60 mL/min (ref >60)
Glucose, Bld: 293 mg/dL — ABNORMAL HIGH (ref 70–99)
Potassium: 3.8 mmol/L (ref 3.5–5.1)
Sodium: 133 mmol/L — ABNORMAL LOW (ref 135–145)

## 2023-01-30 LAB — MAGNESIUM: Magnesium: 1.3 mg/dL — ABNORMAL LOW (ref 1.7–2.4)

## 2023-01-30 LAB — TYPE AND SCREEN: Unit division: 0

## 2023-01-30 LAB — PREPARE RBC (CROSSMATCH)

## 2023-01-30 MED ORDER — MAGNESIUM SULFATE 2 GM/50ML IV SOLN
2.0000 g | Freq: Once | INTRAVENOUS | Status: AC
  Administered 2023-01-30: 2 g via INTRAVENOUS
  Filled 2023-01-30: qty 50

## 2023-01-30 MED ORDER — LORAZEPAM 2 MG/ML IJ SOLN
0.2500 mg | Freq: Once | INTRAMUSCULAR | Status: AC
  Administered 2023-01-30: 0.25 mg via INTRAVENOUS
  Filled 2023-01-30: qty 1

## 2023-01-30 MED ORDER — SODIUM CHLORIDE 0.9 % IV SOLN: INTRAVENOUS | Status: DC

## 2023-01-30 MED ORDER — CALCIUM GLUCONATE-NACL 1-0.675 GM/50ML-% IV SOLN
1.0000 g | Freq: Once | INTRAVENOUS | Status: AC
  Administered 2023-01-30: 1000 mg via INTRAVENOUS
  Filled 2023-01-30: qty 50

## 2023-01-30 MED ORDER — TRAVASOL 10 % IV SOLN
INTRAVENOUS | Status: AC
  Filled 2023-01-30: qty 783.4

## 2023-01-30 MED ORDER — HYDROMORPHONE HCL 1 MG/ML IJ SOLN
0.5000 mg | Freq: Once | INTRAMUSCULAR | Status: AC
  Administered 2023-01-30: 0.5 mg via INTRAVENOUS
  Filled 2023-01-30: qty 0.5

## 2023-01-30 MED ORDER — INSULIN ASPART 100 UNIT/ML IJ SOLN
0.0000 [IU] | Freq: Four times a day (QID) | INTRAMUSCULAR | Status: DC
  Administered 2023-01-30: 2 [IU] via SUBCUTANEOUS
  Administered 2023-01-30 – 2023-02-01 (×3): 1 [IU] via SUBCUTANEOUS

## 2023-01-30 MED ORDER — SODIUM CHLORIDE 0.9% IV SOLUTION: Freq: Once | INTRAVENOUS | Status: DC

## 2023-01-30 NOTE — Progress Notes (Addendum)
PHARMACY - TOTAL PARENTERAL NUTRITION CONSULT NOTE   Indication: Small bowel obstruction  Patient Measurements: Height: 5\' 2"  (157.5 cm) Weight: 52.5 kg (115 lb 11.9 oz) IBW/kg (Calculated) : 50.1 TPN AdjBW (KG): 59.9 Body mass index is 21.17 kg/m. Usual Weight:   Assessment:  Pharmacy is consulted to start TPN on 52 yo female admitted with abdominal pain, distention, and nausea with episodes of emesis over the past 2 weeks. Extensive history of small bowel stricturing and prior surgeries d/t Crohn's disease. CT shows distal SBO. Recent admission in March 2024 for SBO with NGT & TPN, plans for surgery, but pt left AMA. Now with tentative plans for surgery on 6/17.   Glucose / Insulin: no hx of DM  - CBGs previously at goal (100-150) on 1/2-rate TPN, bumped to 200s yesterday after pre-op dexamethasone (no postop dose ordered) Electrolytes:  - K improved today after repletion yesterday, although Mg remains low (patient did not receive 20 mEq K and 2g Mg IV ordered yesterday d/t MAR hold) - Na now low; previously stable WNL; bicarb now borderline low after increasing Cl:Ac ratio - Ca borderline low & stable; Phos, Cl stable WNL Renal: SCr, BUN stable WNL - UOP adequate, although per RN, unable to capture full volumes d/t voiding habits Hepatic: (6/17) LFTs, Tbili stable WNL; albumin low and decreasing - TG WNL (6/17) I/O: +6.8 L this admission (per charting) - no UGI output (no emesis, no NG present) - 1 liquid stool charted 6/16 (last prior BM 6/4) - D5-1/2NS at 85 ml/hr GI Imaging: - 6/13 CTa/p: high-grade distal small-bowel obstruction d/t stricture vs adhesions; no active Crohn's dz noted - 6/15 AXR: persistent SBO GI Surgeries / Procedures:  - 6/17: Ex lap with lysis of adhesions, resection of prior ileocolic anastomosis (which had become strictured), new ileocolic anastomosis. 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  Central access: PICC TPN start date: 6/16   Nutritional Goals: Current TPN formulation (54.5g AA, 15% CHO, 30 g/L lipid) at 80 ml/hr provides 105 g protein, 1974 kcal, 1920 mL daily RD Assessment: Estimated Needs Total Energy Estimated Needs: 1800-2000 Total Protein Estimated Needs: 90-105g Total Fluid Estimated Needs: 2L/day  Current Nutrition:  NPO, TPN  Plan:  Mg 2 g x 1 ordered by MD; will give an additional Mag 2 g IV x 1 today (4g total) Ca gluconate 1g IV x 1   Increase TPN to 60 mL/hr at 1800; slow advancement until electrolytes stabilize Electrolytes in TPN: increase Na, Mg, Ac Na 100 mEq/L  K 52mEq/L  Ca 12mEq/L Mg 10 mEq/L Phos 69mmol/L  Cl:Ac = max Ac Add standard MVI and trace elements to TPN Thiamine 100 mg IV daily x 6 given refeeding risk Decrease MIVF to 50 ml/hr; change to plain NS given hyperglycemia and low Na Adjust sensitive SSI back to q6 hr while hyperglycemic from periop steroids Monitor TPN labs on Mon/Thurs Bmet, Mag, Phos daily x 3 days given s/s refeeding   Bernadene Person, PharmD, BCPS [phone] 01/30/2023, 8:07 AM

## 2023-01-30 NOTE — Progress Notes (Signed)
Patient given ativan and attempted to replace NG tube with day shift RN at 2015. NG tube was inserted to 45cm before patient started gagging and panicking. She did not allow [place] to proceed any further, despite NG Tube being partially inserted. NG tube that was partially inserted was removed.   NG Tube reinsertion unsuccessful and patient expressed she would have to be sedated and is usually sedated for NG tube placement.   Patient states "Please let the doctor figure out what to do, this isn't working."

## 2023-01-30 NOTE — Progress Notes (Signed)
Patient started to cough, called out and NG tube was out. Messaged MD on call. Verbal order to replace tube.

## 2023-01-30 NOTE — Progress Notes (Signed)
1 Day Post-Op   Subjective/Chief Complaint: Comfortable this morning   Objective: Vital signs in last 24 hours: Temp:  [97.6 F (36.4 C)-98.6 F (37 C)] 98.1 F (36.7 C) (06/18 0414) Pulse Rate:  [57-105] 91 (06/18 0414) Resp:  [12-20] 16 (06/18 0414) BP: (102-154)/(63-125) 102/70 (06/18 0414) SpO2:  [93 %-100 %] 100 % (06/18 0400) FiO2 (%):  [0 %] 0 % (06/18 0416) Weight:  [52.5 kg-59.9 kg] 52.5 kg (06/18 0421) Last BM Date : 01/29/23  Intake/Output from previous day: 06/17 0701 - 06/18 0700 In: 4522.2 [I.V.:2727.2; IV Piggyback:1195] Out: 1400 [Urine:1200; Emesis/NG output:100; Blood:100] Intake/Output this shift: No intake/output data recorded.  Exam: Awake and alert Abdomen soft, dressing dry,   Lab Results:  Recent Labs    01/29/23 0958 01/30/23 0339  WBC 5.5 8.7  HGB 10.2* 7.2*  HCT 31.7* 22.1*  PLT 130* 98*   BMET Recent Labs    01/29/23 0418 01/30/23 0339  NA 137 133*  K 2.8* 3.8  CL 106 107  CO2 26 22  GLUCOSE 121* 293*  BUN 6 9  CREATININE 0.64 0.68  CALCIUM 6.8* 6.9*   PT/INR No results for input(s): "LABPROT", "INR" in the last 72 hours. ABG No results for input(s): "PHART", "HCO3" in the last 72 hours.  Invalid input(s): "PCO2", "PO2"  Studies/Results: DG Abd 1 View  Result Date: 01/29/2023 CLINICAL DATA:  NG tube placement. Small bowel obstruction status post exploratory laparotomy and bowel resection today. EXAM: ABDOMEN - 1 VIEW COMPARISON:  Abdominal radiograph 01/27/2023 FINDINGS: An enteric tube has been placed and terminates in the expected region of the gastric body with side port also below the diaphragm. New intraperitoneal free air is consistent with recent surgery. Small bowel dilatation included portion of the abdomen is decreased from prior study. An IVC filter and skin staples are noted. There are small left and possible trace right pleural effusions. IMPRESSION: 1. Enteric tube in the stomach. 2. Pneumoperitoneum consistent  with recent surgery. Electronically Signed   By: Sebastian Ache M.D.   On: 01/29/2023 16:25    Anti-infectives: Anti-infectives (From admission, onward)    Start     Dose/Rate Route Frequency Ordered Stop   01/29/23 0800  cefoTEtan (CEFOTAN) 2 g in sodium chloride 0.9 % 100 mL IVPB        2 g 200 mL/hr over 30 Minutes Intravenous On call to O.R. 01/28/23 0815 01/29/23 1301       Assessment/Plan: s/p Procedure(s): EXPLORATORY LAPAROTOMY; LYSIS OF ADHESIONS, RESECTION OF ILEOCOLIC ANASTOMOSIS, NEW ILEOCOLIC ANASTOMOSIS (N/A)  POD#1  Continue NG and foley for strict I's and O's Continue TNA Suspect she may have a prolonged ileus from the surgery and chronic bowel dilation Hgb 7.2 down from 10.2.  part dilutional and part surgical blood loss.  Given SBP, will transfuse one unit.  I explained this to the patient and she agrees with the plans   LOS: 6 days    Abigail Miyamoto MD 01/30/2023

## 2023-01-30 NOTE — Evaluation (Signed)
Physical Therapy Evaluation Patient Details Name: Tara Berger MRN: [id number] DOB: 1971-07-28 Today's Date: 01/30/2023  History of Present Illness  52 y.o. 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 Dx of SBO. s/p exploratory laparotomy, LOA, resection of iliocolic anastamosis 01/29/23.  Clinical Impression  Pt admitted with above diagnosis. Min A for log roll and sidelying to sit. In sitting at edge of bed pt became tearful, stated pain was too severe to attempt further mobility. Pt used PCA prior to mobility and had dilaudid at 8:00. Good progress expected once pain is better managed. At baseline she ambulates independently without an assistive device.  Pt currently with functional limitations due to the deficits listed below (see PT Problem List). Pt will benefit from acute skilled PT to increase their independence and safety with mobility to allow discharge.          Recommendations for follow up therapy are one component of a multi-disciplinary discharge planning process, led by the attending physician.  Recommendations may be updated based on patient status, additional functional criteria and insurance authorization.  Follow Up Recommendations       Assistance Recommended at Discharge Set up  Supervision/Assistance  Patient can return home with the following  A little help with walking and/or transfers;A little help with bathing/dressing/bathroom;Assist for transportation;Help with stairs or ramp for entrance;Assistance with cooking/housework    Equipment Recommendations Rolling walker (2 wheels)  Recommendations for Other Services       Functional Status Assessment Patient has had a recent decline in their functional status and demonstrates the ability to make significant improvements in function in a reasonable and predictable amount of time.     Precautions / Restrictions Precautions Precautions: Other (comment) Precaution Comments: abdominal Restrictions Weight Bearing Restrictions: No      Mobility  Bed Mobility Overal bed mobility: Needs Assistance Bed Mobility: Rolling, Sidelying to Sit, Sit to Supine Rolling: Min assist Sidelying to sit: Min assist   Sit to supine: Mod assist   General bed mobility comments: VCs for log roll technique, min A to initiate roll and to raise trunk, Mod A for BLEs into bed. Pt teary sitting EOB 2* pain, she declined further mobility 2* pain.    Transfers                        Ambulation/Gait                  Stairs            Wheelchair Mobility    Modified Rankin (Stroke Patients Only)       Balance Overall balance assessment: Needs assistance Sitting-balance support: Feet unsupported, No upper extremity supported Sitting balance-Leahy Scale: Fair  Pertinent Vitals/Pain Pain Assessment Pain Assessment: 0-10 Pain Score: 7  Pain Location: abdomen and throat Pain Descriptors / Indicators: Sore, Crying Pain Intervention(s): Limited activity within patient's tolerance, Monitored during session, Premedicated before session, PCA encouraged, Repositioned    Home Living Family/patient expects to be discharged to:: Private residence Living  Arrangements: Alone Available Help at Discharge: Family;Available PRN/intermittently Type of Home: Apartment Home Access: Elevator       Home Layout: One level Home Equipment: None      Prior Function Prior Level of Function : Independent/Modified Independent             Mobility Comments: doesn't drive, daughters and brother assist with transportation; walks without assistive device       Hand Dominance        Extremity/Trunk Assessment   Upper Extremity Assessment Upper Extremity Assessment: Overall WFL for tasks assessed    Lower Extremity Assessment Lower Extremity Assessment: Overall WFL for tasks assessed    Cervical / Trunk Assessment Cervical / Trunk Assessment: Normal  Communication   Communication: No difficulties  Cognition Arousal/Alertness: Awake/alert Behavior During Therapy: WFL for tasks assessed/performed Overall Cognitive Status: Within Functional Limits for tasks assessed                                          General Comments      Exercises     Assessment/Plan    PT Assessment Patient needs continued PT services  PT Problem List Decreased activity tolerance;Decreased mobility;Pain       PT Treatment Interventions DME instruction;Gait training;Therapeutic activities;Functional mobility training;Therapeutic exercise;Patient/family education    PT Goals (Current goals can be found in the Care Plan section)  Acute Rehab PT Goals Patient Stated Goal: to travel PT Goal Formulation: All assessment and education complete, DC therapy Time For Goal Achievement: 02/13/23 Potential to Achieve Goals: Good    Frequency Min 1X/week     Co-evaluation               AM-PAC PT "6 Clicks" Mobility  Outcome Measure Help needed turning from your back to your side while in a flat bed without using bedrails?: A Little Help needed moving from lying on your back to sitting on the side of a flat bed without using  bedrails?: A Little Help needed moving to and from a bed to a chair (including a wheelchair)?: A Little Help needed standing up from a chair using your arms (e.g., wheelchair or bedside chair)?: A Little Help needed to walk in hospital room?: Total Help needed climbing 3-5 steps with a railing? : Total 6 Click Score: 14    End of Session   Activity Tolerance: Patient limited by pain Patient left: in bed;with bed alarm set;with call bell/phone within reach Nurse Communication: Mobility status;Other (comment) (pt c/o throat pain) PT Visit Diagnosis: Difficulty in walking, not elsewhere classified (R26.2);Pain    Time: 1610-9604 PT Time Calculation (min) (ACUTE ONLY): 10 min   Charges:   PT Evaluation $PT Eval Moderate Complexity: 1 Mod         Tamala Ser PT 01/30/2023  Acute Rehabilitation Services  Office [phone]

## 2023-01-30 NOTE — Progress Notes (Signed)
Nutrition Follow-up  DOCUMENTATION CODES:   Non-severe (moderate) malnutrition in context of chronic illness  INTERVENTION:   Monitor magnesium, potassium, and phosphorus for at least 3 days, MD to replete as needed, as pt is at risk for refeeding syndrome.   -TPN management per Pharmacy -Agree with 100 mg Thiamine daily x 5 days given refeeding risk -Daily weights while on TPN   NEW NUTRITION DIAGNOSIS:   Moderate Malnutrition related to chronic illness, altered GI function (Crohn's disease with multiple SBOs and GI surgeries) as evidenced by mild fat depletion, moderate muscle depletion, percent weight loss.  GOAL:   Patient will meet greater than or equal to 90% of their needs  Progressing with TPN  MONITOR:   Labs, Weight trends, I & O's (TPN)  ASSESSMENT:   52 y.o. 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 She returned to the emergency department 01/24/23 with complaints of abdominal distention, abdominal pain, nausea and multiple episodes of emesis over the last 2 weeks.Admitted for high grade SBO.  6/12: admitted 6/16: TPN initiated 6/17: s/p ex lap with lysis of adhesion, resection of ileocolic anastomosis, new ileocolic anastomosis   Patient in room, NGT in place. TPN running at 40 ml/hr. Per patient, following her discharge from [facility] on 5/23, she was told to follow a GI soft diet. Was trying to eat and drink protein shakes. Reports these  caused her to have diarrhea. Pt would like alternative options if her diet is advanced. Pt was having N/V as well PTA. She is motivated to do what she needs to to get better.   Per Pharmacy note, TPN to increase to 60 ml/hr today, providing ~1479 kcals and 78g protein.  Admission weight: documented as 132 lbs. Weight today: 115 lbs -likely more accurate. Per weight records, pt has lost 27 lbs since 07/19/22 (19% wt loss x 6 months, significant for time frame).  Medications: IV Thiamine, IV calcium gluconate, D5 infusion, IV Mg sulfate  Labs reviewed: CBGs: 133-290 Low Na Low Mg   NUTRITION - FOCUSED PHYSICAL EXAM:  Flowsheet Row Most Recent Value  Orbital Region Mild depletion  Upper Arm Region Mild depletion  Thoracic and Lumbar Region Mild depletion  Buccal Region Moderate depletion  Temple Region Moderate depletion  Clavicle Bone Region Moderate depletion  Clavicle and Acromion Bone Region Moderate depletion  Scapular Bone Region Moderate depletion  Dorsal Hand Mild depletion  Patellar Region Mild depletion  Anterior Thigh Region Mild depletion  Posterior Calf Region Mild depletion  Edema (RD Assessment) None  Hair Reviewed  Eyes Reviewed  Mouth Reviewed  Skin Reviewed       Diet Order:   Diet Order             Diet NPO time specified  Diet effective now                   EDUCATION NEEDS:   Not appropriate for education at this time  Skin:  Skin Assessment: Reviewed RN Assessment  Last BM:  6/16 -type 7  Height:   Ht Readings from Last  1 Encounters:  01/29/23 5\' 2"  (1.575 m)    Weight:   Wt Readings from Last 1 Encounters:  01/30/23 52.5 kg    BMI:  Body mass index is 21.17 kg/m.  Estimated Nutritional Needs:   Kcal:  1800-2000  Protein:  90-105g  Fluid:  2L/day   Tilda Franco, MS, RD, LDN Inpatient Clinical Dietitian Contact information available via Amion

## 2023-01-30 NOTE — Progress Notes (Signed)
PROGRESS NOTE    Tara Berger  NWG:[id number] DOB: February 13, 1971 DOA: 01/24/2023 PCP: Pcp, No     Brief Narrative:  Tara Berger is a 52 y.o. 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 She returned to the emergency department 01/24/23 with complaints of abdominal distention, abdominal pain, nausea and multiple episodes of emesis over the last 2 weeks.  She was able to tolerate drinking fluids until yesterday.  She has been having some loose stools, but no BM in the last 2 days.  At time of admission, imaging revealed high-grade small bowel obstruction.  Patient was admitted to the hospital and general surgery was consulted.  Patient underwent   New events last 24 hours / Subjective: Doing well overall, no complaints  Assessment & Plan:   Principal Problem:   SBO (small bowel obstruction) (HCC) Active Problems:   Crohn's disease (HCC)   Essential hypertension   Left hemiparesis (HCC)   Pulmonary embolism on right (HCC)   Tobacco use disorder   Small bowel obstruction -General surgery following -Started TPN 6/16 -Status post ex lap with lysis of adhesion, resection of ileocolic anastomosis, new ileocolic anastomosis with Dr. Magnus Ivan 6/17. -Continue NG tube, TPN, Dilaudid PCA  Acute blood loss anemia following surgery -Transfuse 1 unit packed red blood cell today  Crohn's disease -GI following   Hypertension -Hold antihypertensives in setting of n.p.o.  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  Discussed with pharmacy today.  Patient was started on Eliquis in December 2023 after hospitalization following right ACA stroke and rheumatic mitral valve disease.  Cardiothoracic surgery was consulted for rheumatic valve management.  Holter monitor recommended for possible valvular A-fib.  She was then hospitalized in March 2024 with left DVT and PE.  At that time, she was switched to Coumadin.  Pharmacist discussed with patient today.  She denies ever taking Coumadin.  She takes Eliquis only on as-needed basis.  -Doubt patient would be compliant with Coumadin with frequent INR checks.  Need to address anticoagulation with her prior to discharge home, likely will discharge back on Eliquis  Tobacco use -Nicotine patch  Hypomagnesemia -Replace    DVT prophylaxis: Lovenox --> Eliquis when okay to resume anticoagulation per general surgery service  Code Status: Full code Family Communication: Sister as well as other family members at bedside Disposition Plan: Home Status is: Inpatient Remains inpatient appropriate because: Remains on TPN, NG tube, Dilaudid PCA, blood transfusion today  Antimicrobials:  Anti-infectives (From admission, onward)    Start     Dose/Rate Route Frequency Ordered Stop   01/29/23 0800  cefoTEtan (CEFOTAN) 2 g in sodium chloride 0.9 % 100 mL IVPB        2 g 200 mL/hr over 30 Minutes Intravenous On call to O.R. 01/28/23 0815 01/29/23 1301        Objective: Vitals:   01/30/23 0400 01/30/23 0414 01/30/23 0421 01/30/23 0947  BP:  102/70  104/61  Pulse:  91  77  Resp: 16 16  18   Temp:  98.1 F (36.7 C)  98.1 F (36.7 C)  TempSrc:  Oral  Oral  SpO2: 100%     Weight:   52.5 kg   Height:        Intake/Output Summary (Last 24 hours) at 01/30/2023 1148 Last  data filed at 01/30/2023 1000 Gross per 24 hour  Intake 4025.95 ml  Output 1500 ml  Net 2525.95 ml    Filed Weights   01/24/23 1124 01/29/23 1112 01/30/23 0421  Weight: 59.9 kg 59.9 kg 52.5 kg    Examination:  General exam: Appears calm  Respiratory system: Clear to auscultation. Respiratory effort normal. No respiratory distress. No conversational dyspnea.  Cardiovascular system: S1 & S2 heard, RRR. No murmurs. No pedal edema. Gastrointestinal system: Abdomen is nondistended, soft.  Honeycomb dressing in place.  No bowel sounds. Skin: No rashes, lesions or ulcers on exposed skin  Psychiatry: Judgement and insight appear normal. Mood & affect appropriate.   Data Reviewed: I have personally reviewed following labs and imaging studies  CBC: Recent Labs  Lab 01/24/23 1240 01/25/23 0507 01/26/23 0450 01/29/23 0958 01/30/23 0339  WBC 8.8 7.4 6.3 5.5 8.7  NEUTROABS 6.9  --   --   --   --   HGB 11.1* 9.3* 10.3* 10.2* 7.2*  HCT 34.9* 28.9* 33.8* 31.7* 22.1*  MCV 88.8 89.5 93.1 89.8 88.0  PLT 270 208 187 130* 98*    Basic Metabolic Panel: Recent Labs  Lab 01/24/23 1240 01/25/23 0507 01/26/23 0450 01/29/23 0418 01/30/23 0339  NA 137 136 137 137 133*  K 3.3* 4.5 4.1 2.8* 3.8  CL 99 104 107 106 107  CO2 25 25 21* 26 22  GLUCOSE 102* 74 52* 121* 293*  BUN 18 13 11 6 9   CREATININE 0.86 0.75 0.66 0.64 0.68  CALCIUM 8.0* 7.7* 7.8* 6.8* 6.9*  MG 1.6* 2.1  --  1.0* 1.3*  PHOS 4.7*  --   --  3.7 3.4    GFR: Estimated Creatinine Clearance: 65.8 mL/min (by C-G formula based on SCr of 0.68 mg/dL). Liver Function Tests: Recent Labs  Lab 01/24/23 1240 01/25/23 0507 01/29/23 0418  AST 40 26 36  ALT 29 23 25   ALKPHOS 101 74 56  BILITOT 1.2 0.6 0.6  PROT 6.7 5.5* 4.3*  ALBUMIN 2.9* 2.2* 1.7*    Recent Labs  Lab 01/24/23 1240  LIPASE 22    No results for input(s): "AMMONIA" in the last 168 hours. Coagulation Profile: No results for input(s): "INR", "PROTIME" in  the last 168 hours. Cardiac Enzymes: No results for input(s): "CKTOTAL", "CKMB", "CKMBINDEX", "TROPONINI" in the last 168 hours. BNP (last 3 results) No results for input(s): "PROBNP" in the last 8760 hours. HbA1C: No results for input(s): "HGBA1C" in the last 72 hours. CBG: Recent Labs  Lab 01/28/23 2333 01/29/23 0610 01/29/23 1757 01/29/23 2015 01/30/23 0538  GLUCAP 149* 133* 241* 290* 220*    Lipid Profile: Recent Labs    01/29/23 0418  TRIG 71    Thyroid Function Tests: No results for input(s): "TSH", "T4TOTAL", "FREET4", "T3FREE", "THYROIDAB" in the last 72 hours. Anemia Panel: No results for input(s): "VITAMINB12", "FOLATE", "FERRITIN", "TIBC", "IRON", "RETICCTPCT" in the last 72 hours. Sepsis Labs: Recent Labs  Lab 01/24/23 1452  LATICACIDVEN 0.9     Recent Results (from the past 240 hour(s))  Blood culture (routine x 2)     Status: None   Collection Time:  01/24/23  2:52 PM   Specimen: BLOOD  Result Value Ref Range Status   Specimen Description   Final    BLOOD LEFT ANTECUBITAL Performed at [facility], [address], [place], [place] [address]    Special Requests   Final    BOTTLES DRAWN AEROBIC AND ANAEROBIC Blood Culture adequate volume Performed at [facility], [address], [place], [place] [address]    Culture   Final    NO GROWTH 5 DAYS Performed at [facility], [address], [place], [place] [address]    Report Status 01/29/2023 FINAL  Final  Culture, blood (Routine X 2) w Reflex to ID Panel     Status: None   Collection Time: 01/24/23  9:12 PM   Specimen: BLOOD LEFT HAND  Result Value Ref Range Status   Specimen Description   Final    BLOOD LEFT HAND Performed at [facility], [address] [place], [place] [address]    Special Requests   Final    BOTTLES DRAWN AEROBIC AND ANAEROBIC BOTTLES DRAWN AEROBIC ONLY Performed at [facility], [address],  [place], [place] [address]    Culture   Final    NO GROWTH 5 DAYS Performed at [facility], [address], [place], [place] [address]    Report Status 01/29/2023 FINAL  Final      Radiology Studies: DG Abd 1 View  Result Date: 01/29/2023 CLINICAL DATA:  NG tube placement. Small bowel obstruction status post exploratory laparotomy and bowel resection today. EXAM: ABDOMEN - 1 VIEW COMPARISON:  Abdominal radiograph 01/27/2023 FINDINGS: An enteric tube has been placed and terminates in the expected region of the gastric body with side port also below the diaphragm. New intraperitoneal free air is consistent with recent surgery. Small bowel dilatation included portion of the abdomen is decreased from prior study. An IVC filter and skin staples are noted. There are small left and possible trace right pleural effusions. IMPRESSION: 1. Enteric tube in the stomach. 2. Pneumoperitoneum consistent with recent surgery. Electronically Signed   By: Sebastian Ache M.D.   On: 01/29/2023 16:25      Scheduled Meds:  sodium chloride   Intravenous Once   Chlorhexidine Gluconate Cloth  6 each Topical Daily   HYDROmorphone   Intravenous Q4H   insulin aspart  0-9 Units Subcutaneous Q6H   nicotine  14 mg Transdermal Daily   pantoprazole (PROTONIX) IV  40 mg Intravenous Daily   sodium chloride flush  10-40 mL Intracatheter Q12H   thiamine (VITAMIN B1) injection  100 mg Intravenous Daily   Continuous Infusions:  sodium chloride     acetaminophen 1,000 mg (01/30/23 0602)   calcium gluconate     dextrose 5 % and 0.45 % NaCl 85 mL/hr at 01/29/23 1731   magnesium sulfate bolus IVPB     methocarbamol (ROBAXIN) IV 1,000 mg (01/30/23 0954)   TPN ADULT (ION) 40 mL/hr at 01/29/23 1843   TPN ADULT (ION)       LOS: 6 days   Time spent: 25 minutes   Noralee Stain, DO [facility] 01/30/2023, 11:48 AM   Available via Epic secure chat 7am-7pm After these hours, please refer to coverage provider listed on  [email or web address]

## 2023-01-30 NOTE — Progress Notes (Signed)
Order for Ativan placed, given to patient bf attempt to replace NG tube. NG tube partially in and patient would not allow tube to be placed successfully. Night nurse will notify MD on call.

## 2023-01-31 ENCOUNTER — Inpatient Hospital Stay (HOSPITAL_COMMUNITY): Payer: Medicaid Other

## 2023-01-31 DIAGNOSIS — K56609 Unspecified intestinal obstruction, unspecified as to partial versus complete obstruction: Secondary | ICD-10-CM | POA: Diagnosis not present

## 2023-01-31 LAB — CBC
HCT: 24.2 % — ABNORMAL LOW (ref 36.0–46.0)
Hemoglobin: 8 g/dL — ABNORMAL LOW (ref 12.0–15.0)
MCH: 27.5 pg (ref 26.0–34.0)
MCHC: 33.1 g/dL (ref 30.0–36.0)
MCV: 83.2 fL (ref 80.0–100.0)
Platelets: 95 10*3/uL — ABNORMAL LOW (ref 150–400)
RBC: 2.91 MIL/uL — ABNORMAL LOW (ref 3.87–5.11)
RDW: 17.2 % — ABNORMAL HIGH (ref 11.5–15.5)
WBC: 10.1 10*3/uL (ref 4.0–10.5)
nRBC: 0 % (ref 0.0–0.2)

## 2023-01-31 LAB — URINALYSIS, ROUTINE W REFLEX MICROSCOPIC
Bacteria, UA: NONE SEEN
Bilirubin Urine: NEGATIVE
Glucose, UA: NEGATIVE mg/dL
Ketones, ur: NEGATIVE mg/dL
Nitrite: NEGATIVE
Protein, ur: NEGATIVE mg/dL
Specific Gravity, Urine: 1.006 (ref 1.005–1.030)
pH: 7 (ref 5.0–8.0)

## 2023-01-31 LAB — BASIC METABOLIC PANEL
Anion gap: 3 — ABNORMAL LOW (ref 5–15)
BUN: 7 mg/dL (ref 6–20)
CO2: 25 mmol/L (ref 22–32)
Calcium: 7.5 mg/dL — ABNORMAL LOW (ref 8.9–10.3)
Chloride: 105 mmol/L (ref 98–111)
Creatinine, Ser: 0.51 mg/dL (ref 0.44–1.00)
GFR, Estimated: >60 mL/min (ref >60)
Glucose, Bld: 133 mg/dL — ABNORMAL HIGH (ref 70–99)
Potassium: 3.8 mmol/L (ref 3.5–5.1)
Sodium: 133 mmol/L — ABNORMAL LOW (ref 135–145)

## 2023-01-31 LAB — GLUCOSE, CAPILLARY
Glucose-Capillary: 119 mg/dL — ABNORMAL HIGH (ref 70–99)
Glucose-Capillary: 104 mg/dL — ABNORMAL HIGH (ref 70–99)
Glucose-Capillary: 127 mg/dL — ABNORMAL HIGH (ref 70–99)

## 2023-01-31 LAB — PHOSPHORUS: Phosphorus: 3.1 mg/dL (ref 2.5–4.6)

## 2023-01-31 LAB — HEMOGLOBIN AND HEMATOCRIT, BLOOD
HCT: 23.7 % — ABNORMAL LOW (ref 36.0–46.0)
Hemoglobin: 8.1 g/dL — ABNORMAL LOW (ref 12.0–15.0)

## 2023-01-31 LAB — SURGICAL PATHOLOGY

## 2023-01-31 LAB — TYPE AND SCREEN

## 2023-01-31 LAB — MAGNESIUM: Magnesium: 1.9 mg/dL (ref 1.7–2.4)

## 2023-01-31 MED ORDER — ACETAMINOPHEN 10 MG/ML IV SOLN
1000.0000 mg | Freq: Four times a day (QID) | INTRAVENOUS | Status: AC
  Administered 2023-01-31 – 2023-02-01 (×4): 1000 mg via INTRAVENOUS
  Filled 2023-01-31 (×4): qty 100

## 2023-01-31 MED ORDER — TRAVASOL 10 % IV SOLN
INTRAVENOUS | Status: AC
  Filled 2023-01-31: qty 1044.5

## 2023-01-31 NOTE — Progress Notes (Signed)
PROGRESS NOTE  Tara Berger ZOX:[id number] DOB: 1971-03-06 DOA: 01/24/2023 PCP: Pcp, No   LOS: 7 days   Brief Narrative / Interim history: Tara Berger is a 52 y.o. 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 She returned to the emergency department 01/24/23 with complaints of abdominal distention, abdominal pain, nausea and multiple episodes of emesis over the last 2 weeks.  She was able to tolerate drinking fluids until yesterday.  She has been having some loose stools, but no BM in the last 2 days.  At time of admission, imaging revealed high-grade small bowel obstruction.  Patient was admitted to the hospital and general surgery was consulted.  She eventually underwent surgery on 6/17  Subjective / 24h Interval events: Complains of some abdominal soreness, had some nausea this morning but no vomiting.  NG tube came out overnight and she could not tolerate to be replaced  Assesement and Plan: Principal Problem:   SBO (small bowel obstruction) (HCC) Active Problems:   Crohn's disease (HCC)   Essential hypertension   Left hemiparesis (HCC)   Pulmonary embolism on right (HCC)   Tobacco use disorder   Malnutrition of moderate degree   Principal problem Small bowel obstruction -General surgery consulted, has not improved with conservative management and she was taken to the OR on 6/17 status post ex lap, LOA, resection of old ileocolic anastomosis and formation  of a new ileocolic anastomosis -Continue n.p.o., TPN, management per general surgery.  She has been having 2 bowel movements this morning per patient  Active problems Acute blood loss anemia, following surgery -status post unit of packed red blood cells on 6/18  Crohn's disease -GI saw patient on 6/15, did not think steroids will be beneficial as this is likely a fibrotic stenosis rather than inflammatory, she has plans to start biologic therapy with outpatient GI through [facility].  History of PE and DVT -she is not on adherence to anticoagulation.  She currently takes Eliquis PRN for "heavy legs" discussed with patient utmost importance to take anticoagulation as prescribed, every day, she is willing to continue Eliquis.  Will discuss with surgery timing to resume anticoagulation, possibly tomorrow if hemoglobin is stable given recent blood transfusion postoperatively  Hypomagnesemia - replace and continue to monitor  Tobacco use-continue nicotine patch  Chronic left hemiparesis following stroke December 2023 -encourage mobility, PT signed off.  Will anticoagulate once OK with surgery  Scheduled Meds:  sodium chloride   Intravenous Once   Chlorhexidine Gluconate Cloth  6 each Topical Daily   HYDROmorphone   Intravenous Q4H   insulin aspart  0-9 Units Subcutaneous Q6H   nicotine  14 mg Transdermal Daily   pantoprazole (PROTONIX) IV  40 mg Intravenous Daily   sodium chloride flush  10-40 mL Intracatheter Q12H   thiamine (VITAMIN B1) injection  100 mg Intravenous Daily   Continuous Infusions:  sodium chloride 50 mL/hr at 01/31/23 0600   acetaminophen 1,000 mg (01/31/23 1245)   methocarbamol (ROBAXIN) IV 1,000 mg (01/31/23 0937)   TPN ADULT (ION) 60 mL/hr at 01/30/23 1751   TPN ADULT (  ION)     PRN Meds:.diphenhydrAMINE **OR** diphenhydrAMINE, menthol-cetylpyridinium, naloxone **AND** sodium chloride flush, ondansetron (ZOFRAN) IV, prochlorperazine, sodium chloride flush  Current  Outpatient Medications  Medication Instructions   apixaban (ELIQUIS) 5 mg, Oral, 2 times daily   aspirin 81 mg, Oral, Daily   mirtazapine (REMERON) 15 mg, Oral, Daily at bedtime    Diet Orders (From admission, onward)     Start     Ordered   01/25/23 0830  Diet NPO time specified  Diet effective now        01/25/23 1610            DVT prophylaxis:    Lab Results  Component Value Date   PLT 95 (L) 01/31/2023      Code Status: Full Code  Family Communication: no family at bedside   Status is: Inpatient  Remains inpatient appropriate because: severity of illness  Level of care: Med-Surg  Consultants:  General surgery  GI  Objective: Vitals:   01/31/23 0746 01/31/23 0930 01/31/23 1023 01/31/23 1245  BP:  126/83    Pulse:  (!) 131 100   Resp: 16 18  (!) 22  Temp:  98 F (36.7 C)    TempSrc:  Oral    SpO2: 100%   100%  Weight:      Height:        Intake/Output Summary (Last 24 hours) at 01/31/2023 1306 Last data filed at 01/31/2023 1245 Gross per 24 hour  Intake 2782.58 ml  Output 3550 ml  Net -767.42 ml   Wt Readings from Last 3 Encounters:  01/30/23 52.5 kg    Examination:  Constitutional: NAD Eyes: no scleral icterus ENMT: Mucous membranes are moist.  Neck: normal, supple Respiratory: clear to auscultation bilaterally, no wheezing, no crackles. Normal respiratory effort. No accessory muscle use.  Cardiovascular: Regular rate and rhythm, no murmurs / rubs / gallops. No LE edema.  Abdomen: non distended, no tenderness. Bowel sounds positive.  Musculoskeletal: no clubbing / cyanosis.   Data Reviewed: I have independently reviewed following labs and imaging studies   CBC Recent Labs  Lab 01/25/23 0507 01/26/23 0450 01/29/23 0958 01/30/23 0118 01/30/23 0339 01/31/23 0118  WBC 7.4 6.3 5.5  --  8.7 10.1  HGB 9.3* 10.3* 10.2* 8.1* 7.2* 8.0*  HCT 28.9* 33.8* 31.7* 23.7* 22.1* 24.2*  PLT 208 187 130*  --  98* 95*  MCV 89.5 93.1 89.8  --   88.0 83.2  MCH 28.8 28.4 28.9  --  28.7 27.5  MCHC 32.2 30.5 32.2  --  32.6 33.1  RDW 15.0 15.0 15.2  --  15.1 17.2*    Recent Labs  Lab 01/24/23 1452 01/25/23 0507 01/26/23 0450 01/29/23 0418 01/30/23 0339 01/31/23 0118  NA  --  136 137 137 133* 133*  K  --  4.5 4.1 2.8* 3.8 3.8  CL  --  104 107 106 107 105  CO2  --  25 21* 26 22 25   GLUCOSE  --  74 52* 121* 293* 133*  BUN  --  13 11 6 9 7   CREATININE  --  0.75 0.66 0.64 0.68 0.51  CALCIUM  --  7.7* 7.8* 6.8* 6.9* 7.5*  AST  --  26  --  36  --   --   ALT  --  23  --  25  --   --   ALKPHOS  --  74  --  56  --   --  BILITOT  --  0.6  --  0.6  --   --   ALBUMIN  --  2.2*  --  1.7*  --   --   MG  --  2.1  --  1.0* 1.3* 1.9  LATICACIDVEN 0.9  --   --   --   --   --     ------------------------------------------------------------------------------------------------------------------ Recent Labs    01/29/23 0418  TRIG 71    No results found for: "HGBA1C" ------------------------------------------------------------------------------------------------------------------ No results for input(s): "TSH", "T4TOTAL", "T3FREE", "THYROIDAB" in the last 72 hours.  Invalid input(s): "FREET3"  Cardiac Enzymes No results for input(s): "CKMB", "TROPONINI", "MYOGLOBIN" in the last 168 hours.  Invalid input(s): "CK" ------------------------------------------------------------------------------------------------------------------ No results found for: "BNP"  CBG: Recent Labs  Lab 01/30/23 0538 01/30/23 1202 01/30/23 1750 01/30/23 2343 01/31/23 0623  GLUCAP 220* 143* 143* 108* 119*    Recent Results (from the past 240 hour(s))  Blood culture (routine x 2)     Status: None   Collection Time: 01/24/23  2:52 PM   Specimen: BLOOD  Result Value Ref Range Status   Specimen Description   Final    BLOOD LEFT ANTECUBITAL Performed at [facility], [address], [place], [place] [address]    Special Requests    Final    BOTTLES DRAWN AEROBIC AND ANAEROBIC Blood Culture adequate volume Performed at [facility], [address], [place], [place] [address]    Culture   Final    NO GROWTH 5 DAYS Performed at [facility], [address] [place], [place] [address]    Report Status 01/29/2023 FINAL  Final  Culture, blood (Routine X 2) w Reflex to ID Panel     Status: None   Collection Time: 01/24/23  9:12 PM   Specimen: BLOOD LEFT HAND  Result Value Ref Range Status   Specimen Description   Final    BLOOD LEFT HAND Performed at [facility], [address], [place], [place] [address]    Special Requests   Final    BOTTLES DRAWN AEROBIC AND ANAEROBIC BOTTLES DRAWN AEROBIC ONLY Performed at [facility], [address], [place], [place] [address]    Culture   Final    NO GROWTH 5 DAYS Performed at [facility], [address], [place], [place] [address]    Report Status 01/29/2023 FINAL  Final     Radiology Studies: No results found.   Pamella Pert, MD, PhD [facility]  Between 7 am - 7 pm I am available, please contact me via Amion (for emergencies) or Securechat (non urgent messages)  Between 7 pm - 7 am I am not available, please contact night coverage MD/APP via Amion

## 2023-01-31 NOTE — Progress Notes (Addendum)
PHARMACY - TOTAL PARENTERAL NUTRITION CONSULT NOTE   Indication: Small bowel obstruction  Patient Measurements: Height: 5\' 2"  (157.5 cm) Weight: 52.5 kg (115 lb 11.9 oz) IBW/kg (Calculated) : 50.1 TPN AdjBW (KG): 59.9 Body mass index is 21.17 kg/m.  Assessment:  Pharmacy is consulted to start TPN on 51 yo female admitted with abdominal pain, distention, and nausea with episodes of emesis over the past 2 weeks. Extensive history of small bowel stricturing and prior surgeries d/t Crohn's disease. CT shows distal SBO. Recent admission in March 2024 for SBO with NGT & TPN, plans for surgery, but pt left AMA. Now with tentative plans for surgery on 6/17.   Glucose / Insulin: no hx of DM  - CBGs back to goal <150 (108-143) after increased into to 200s the previous 24hr, 3 units SSI used Electrolytes:  - Na 133, unchanged since yesterday - Mag 1.9, up from 1.3 on 6/18 - K 3.8, stable Renal: SCr, BUN stable WNL - UOP 2.9L, adequate Hepatic: (6/17) LFTs, Tbili stable WNL; albumin low and decreasing - TG WNL (6/17) I/O: +9.3 L this admission (per charting) - NG out since 6/18 PM, will need to replace if N/V continues - 1 liquid stool charted 6/16 (last prior BM 6/4) - NS at 50 ml/hr GI Imaging: - 6/13 CTa/p: high-grade distal small-bowel obstruction d/t stricture vs adhesions; no active Crohn's dz noted - 6/15 AXR: persistent SBO GI Surgeries / Procedures:  - 6/17: Ex lap with lysis of adhesions, resection of prior ileocolic anastomosis (which had become strictured), new ileocolic anastomosis. 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  Central access: PICC TPN start date: 6/16   Nutritional Goals: Current TPN formulation (54.5g AA, 15% CHO, 30 g/L lipid) at 80 ml/hr provides 105 g protein, 1974 kcal, 1920 mL daily RD Assessment: Estimated Needs Total Energy Estimated Needs: 1800-2000 Total Protein Estimated Needs:  90-105g Total Fluid Estimated Needs: 2L/day  Current Nutrition:  NPO, TPN  Plan:  Increase TPN to goal 80 mL/hr at 1800; slow advancement until electrolytes stabilize Electrolytes in TPN: increased Na, Mg Na 100 mEq/L  K 50mEq/L  Ca 53mEq/L Mg 10 mEq/L Phos 44mmol/L  Cl:Ac = max Ac Add standard MVI and trace elements to TPN Thiamine 100 mg IV daily x 5 given refeeding risk Decrease MIVF to NS 30 ml/hr at 1800;  Continue sensitive SSI q6 hr while hyperglycemic from periop steroids Monitor TPN labs on Mon/Thurs Bmet, Mag, Phos daily x 3 days given s/s refeeding   Hessie Knows, PharmD, BCPS Secure Chat if ?s 01/31/2023 9:47 AM

## 2023-01-31 NOTE — Progress Notes (Signed)
2 Days Post-Op  Subjective: CC: Stable R sided and epigastric abdominal pain. Controlled with PCA. NGT fell out yesterday. She reports nausea and vomiting x 1 this am. No longer feels nauseated. No flatus but did have bm yesterday and today. Foley in place w/ good uop (2.3 ml/hg/hr). Mobilized with PT yesterday.   Afebrile. Tachycardia resolved. No hypotension. WBC wnl. Hgb 8.   Objective: Vital signs in last 24 hours: Temp:  [98 F (36.7 C)-99 F (37.2 C)] 98.6 F (37 C) (06/19 0410) Pulse Rate:  [71-104] 71 (06/19 0410) Resp:  [14-20] 16 (06/19 0746) BP: (101-129)/(61-83) 101/61 (06/19 0410) SpO2:  [94 %-100 %] 100 % (06/19 0746) Last BM Date : 01/31/23  Intake/Output from previous day: 06/18 0701 - 06/19 0700 In: 2277.2 [I.V.:1611.2; Blood:236; NG/GT:100; IV Piggyback:330] Out: 2950 [Urine:2950] Intake/Output this shift: No intake/output data recorded.  PE: Gen:  Alert, NAD, pleasant Card:  Reg Pulm:  Rate and effort normal Abd: Soft, mild distension, appropriately tender around the top of her incision/epigastrium, no rigidity or guarding and otherwise NT. Hypoactive BS. Midline wound with honeycomb dressing in place - there is some dried blood at the inferior portion but no signs of active bleeding/drainage.  GU: Foley with transparent straw colored urine in foley bag.   Lab Results:  Recent Labs    01/30/23 0339 01/31/23 0118  WBC 8.7 10.1  HGB 7.2* 8.0*  HCT 22.1* 24.2*  PLT 98* 95*   BMET Recent Labs    01/30/23 0339 01/31/23 0118  NA 133* 133*  K 3.8 3.8  CL 107 105  CO2 22 25  GLUCOSE 293* 133*  BUN 9 7  CREATININE 0.68 0.51  CALCIUM 6.9* 7.5*   PT/INR No results for input(s): "LABPROT", "INR" in the last 72 hours. CMP     Component Value Date/Time   NA 133 (L) 01/31/2023 0118   K 3.8 01/31/2023 0118   CL 105 01/31/2023 0118   CO2 25 01/31/2023 0118   GLUCOSE 133 (H) 01/31/2023 0118   BUN 7 01/31/2023 0118   CREATININE 0.51 01/31/2023  0118   CALCIUM 7.5 (L) 01/31/2023 0118   PROT 4.3 (L) 01/29/2023 0418   ALBUMIN 1.7 (L) 01/29/2023 0418   AST 36 01/29/2023 0418   ALT 25 01/29/2023 0418   ALKPHOS 56 01/29/2023 0418   BILITOT 0.6 01/29/2023 0418   GFRNONAA >60 01/31/2023 0118   GFRAA  06/15/2010 0447    >60        The eGFR has been calculated using the MDRD equation. This calculation has not been validated in all clinical situations. eGFR's persistently <60 mL/min signify possible Chronic Kidney Disease.   Lipase     Component Value Date/Time   LIPASE 22 01/24/2023 1240    Studies/Results: DG Abd 1 View  Result Date: 01/29/2023 CLINICAL DATA:  NG tube placement. Small bowel obstruction status post exploratory laparotomy and bowel resection today. EXAM: ABDOMEN - 1 VIEW COMPARISON:  Abdominal radiograph 01/27/2023 FINDINGS: An enteric tube has been placed and terminates in the expected region of the gastric body with side port also below the diaphragm. New intraperitoneal free air is consistent with recent surgery. Small bowel dilatation included portion of the abdomen is decreased from prior study. An IVC filter and skin staples are noted. There are small left and possible trace right pleural effusions. IMPRESSION: 1. Enteric tube in the stomach. 2. Pneumoperitoneum consistent with recent surgery. Electronically Signed   By: Jolaine Click.D.  On: 01/29/2023 16:25    Anti-infectives: Anti-infectives (From admission, onward)    Start     Dose/Rate Route Frequency Ordered Stop   01/29/23 0800  cefoTEtan (CEFOTAN) 2 g in sodium chloride 0.9 % 100 mL IVPB        2 g 200 mL/hr over 30 Minutes Intravenous On call to O.R. 01/28/23 0815 01/29/23 1301        Assessment/Plan POD 2 s/p ex lap, loa, resection of ileocolic anastomosis with new ileocolic anastomosis for sbo 2/2 strictured ileocolic anastomosis by Dr. Magnus Ivan 01/29/23 - Path pending - NGT out 6/18 pm. N/v this am. Strict NPO. If any further n/v  recommended replacement of NGT.  - Cont PCA till tolerating PO. Add IV tylenol and cont scheduled Robaxin.  - Will discuss w/ MD about TOV/Foley removal today.  - Ambulate, PT. Currently rec for [facility] PT - Pulm toilet.  ABL anemia - Hgb 7.2 6/18 -> s/p 1U PRBC 6/18 -> hgb 8.0 this am. HDS. Repeat in am.   FEN - NPO, TPN, IVF per [facility] VTE - SCDs, anticoagulation on hold for abl anemia  ID - Cefotetan peri-op. None currently. WBC wnl. Afebrile.  Foley - In place. Possible TOV today.  Plan - As above.   - Per [facility] -  Hx Crohn's - Per GI. Per note, has plans to start biologic therapy in outpatient setting with primary GI team (through [facility]) Hx HTN Hx CVA w/ Chronic left hemiparesis  Hx PE/DVT - Per [facility] note, they plan to discuss anticoagulation prior to discharge.    LOS: 7 days    [name] , [facility] Surgery 01/31/2023, 8:37 AM Please see Amion for pager number during day hours 7:00am-4:30pm

## 2023-01-31 NOTE — Progress Notes (Signed)
Physical Therapy Treatment Patient Details Name: Tara Berger MRN: [id number] DOB: 06/15/1971 Today's Date: 01/31/2023   History of Present Illness 52 y.o. 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 Dx of SBO. s/p exploratory laparotomy, LOA, resection of iliocolic anastamosis 01/29/23.    PT Comments    Pt ambulated 120' with RW with min assist to navigate obstacles as she ran into obstacles and made no attempt to course correct. Pt ambulated with a shuffling gait and stated she was doing the "shimmy". Decreased safety awareness, decreased awareness of deficits.  New confusion noted, RN aware   Recommendations for follow up therapy are one component of a multi-disciplinary discharge planning process, led by the attending physician.  Recommendations may be updated based on patient status, additional functional criteria and insurance authorization.  Follow Up Recommendations       Assistance Recommended at Discharge Set up Supervision/Assistance  Patient can return home with the following A little help with walking and/or transfers;A little help with bathing/dressing/bathroom;Assist for transportation;Help with stairs or ramp for entrance;Assistance with cooking/housework   Equipment Recommendations  Rolling  walker (2 wheels)    Recommendations for Other Services       Precautions / Restrictions Precautions Precautions: Other (comment) Precaution Comments: abdominal surgery Restrictions Weight Bearing Restrictions: No     Mobility  Bed Mobility Overal bed mobility: Needs Assistance Bed Mobility: Supine to Sit Rolling: Min assist     Sit to supine: Min assist   General bed mobility comments: assist to raise trunk and pivot hips to EOB, assist for BLEs into bed    Transfers Overall transfer level: Needs assistance Equipment used: Rolling walker (2 wheels) Transfers: Sit to/from Stand Sit to Stand: Min guard           General transfer comment: VCs hand placement    Ambulation/Gait Ambulation/Gait assistance: Min assist Gait Distance (Feet): 120 Feet Assistive device: Rolling walker (2 wheels) Gait Pattern/deviations: Decreased step length - left, Decreased step length - right, Shuffle, Trunk flexed Gait velocity: decr     General Gait Details: pt stated she was "shimmying", she had very short step length, ran into obstacles with RW with no attempt to course correct, min A to steer RW, VCs for positioning in RW esp with turns. RN reports new onset of confusion this morning.   Stairs             Wheelchair Mobility    Modified Rankin (Stroke Patients Only)       Balance     Sitting balance-Leahy Scale: Fair     Standing balance support: During functional activity, Reliant on assistive device for balance, Bilateral upper extremity supported Standing balance-Leahy Scale: Poor                              Cognition Arousal/Alertness: Awake/alert Behavior During Therapy: Impulsive Overall Cognitive Status: Impaired/Different from baseline Area  of Impairment: Following commands, Safety/judgement, Problem solving                       Following Commands: Follows one step commands inconsistently Safety/Judgement: Decreased  awareness of deficits, Decreased awareness of safety   Problem Solving: Difficulty sequencing, Requires verbal cues, Requires tactile cues, Slow processing General Comments: pt running into obstacles with RW with no attempt to course correct        Exercises      General Comments        Pertinent Vitals/Pain Pain Assessment Pain Assessment: Faces Faces Pain Scale: Hurts little more Pain Location: abdomen Pain Descriptors / Indicators: Grimacing Pain Intervention(s): Limited activity within patient's tolerance, Monitored during session, PCA encouraged, Repositioned    Home Living                          Prior Function            PT Goals (current goals can now be found in the care plan section) Acute Rehab PT Goals Patient Stated Goal: to travel PT Goal Formulation: All assessment and education complete, DC therapy Time For Goal Achievement: 02/13/23 Potential to Achieve Goals: Good Progress towards PT goals: Progressing toward goals    Frequency    Min 1X/week      PT Plan Current plan remains appropriate    Co-evaluation              AM-PAC PT "6 Clicks" Mobility   Outcome Measure  Help needed turning from your back to your side while in a flat bed without using bedrails?: A Little Help needed moving from lying on your back to sitting on the side of a flat bed without using bedrails?: A Little Help needed moving to and from a bed to a chair (including a wheelchair)?: A Little Help needed standing up from a chair using your arms (e.g., wheelchair or bedside chair)?: A Little Help needed to walk in hospital room?: A Little Help needed climbing 3-5 steps with a railing? : A Little 6 Click Score: 18    End of Session   Activity Tolerance: Patient limited by pain Patient left: in bed;with bed alarm set;with call bell/phone within reach Nurse Communication: Mobility status PT Visit Diagnosis: Difficulty in walking, not elsewhere classified  (R26.2);Pain     Time: 3664-4034 PT Time Calculation (min) (ACUTE ONLY): 14 min  Charges:  $Gait Training: 8-22 mins                     Ralene Bathe Kistler PT 01/31/2023  Acute Rehabilitation Services  Office [phone]
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
## 2023-02-01 ENCOUNTER — Encounter (HOSPITAL_COMMUNITY): Payer: Self-pay | Admitting: Internal Medicine

## 2023-02-01 ENCOUNTER — Inpatient Hospital Stay (HOSPITAL_COMMUNITY): Payer: Medicaid Other

## 2023-02-01 DIAGNOSIS — K56609 Unspecified intestinal obstruction, unspecified as to partial versus complete obstruction: Secondary | ICD-10-CM | POA: Diagnosis not present

## 2023-02-01 LAB — GLUCOSE, CAPILLARY
Glucose-Capillary: 128 mg/dL — ABNORMAL HIGH (ref 70–99)
Glucose-Capillary: 133 mg/dL — ABNORMAL HIGH (ref 70–99)
Glucose-Capillary: 137 mg/dL — ABNORMAL HIGH (ref 70–99)
Glucose-Capillary: 151 mg/dL — ABNORMAL HIGH (ref 70–99)
Glucose-Capillary: 178 mg/dL — ABNORMAL HIGH (ref 70–99)
Glucose-Capillary: 185 mg/dL — ABNORMAL HIGH (ref 70–99)

## 2023-02-01 LAB — COMPREHENSIVE METABOLIC PANEL
ALT: 27 U/L (ref 0–44)
AST: 30 U/L (ref 15–41)
Albumin: 1.8 g/dL — ABNORMAL LOW (ref 3.5–5.0)
Alkaline Phosphatase: 62 U/L (ref 38–126)
Anion gap: 3 — ABNORMAL LOW (ref 5–15)
BUN: 10 mg/dL (ref 6–20)
CO2: 29 mmol/L (ref 22–32)
Calcium: 7.7 mg/dL — ABNORMAL LOW (ref 8.9–10.3)
Chloride: 106 mmol/L (ref 98–111)
Creatinine, Ser: 0.51 mg/dL (ref 0.44–1.00)
GFR, Estimated: >60 mL/min (ref >60)
Glucose, Bld: 116 mg/dL — ABNORMAL HIGH (ref 70–99)
Potassium: 4.3 mmol/L (ref 3.5–5.1)
Sodium: 138 mmol/L (ref 135–145)
Total Bilirubin: 0.7 mg/dL (ref 0.3–1.2)
Total Protein: 4.5 g/dL — ABNORMAL LOW (ref 6.5–8.1)

## 2023-02-01 LAB — URINALYSIS, COMPLETE (UACMP) WITH MICROSCOPIC
Bacteria, UA: NONE SEEN
Bilirubin Urine: NEGATIVE
Glucose, UA: NEGATIVE mg/dL
Hgb urine dipstick: NEGATIVE
Ketones, ur: NEGATIVE mg/dL
Leukocytes,Ua: NEGATIVE
Nitrite: NEGATIVE
Protein, ur: NEGATIVE mg/dL
Specific Gravity, Urine: >1.046 — ABNORMAL HIGH (ref 1.005–1.030)
pH: 6 (ref 5.0–8.0)

## 2023-02-01 LAB — URINE CULTURE

## 2023-02-01 LAB — TRANSFUSION REACTION: DAT C3: NEGATIVE

## 2023-02-01 LAB — TYPE AND SCREEN: Unit division: 0

## 2023-02-01 LAB — PREPARE RBC (CROSSMATCH)

## 2023-02-01 LAB — CBC
HCT: 20.6 % — ABNORMAL LOW (ref 36.0–46.0)
Hemoglobin: 6.8 g/dL — CL (ref 12.0–15.0)
MCH: 27.3 pg (ref 26.0–34.0)
MCHC: 33 g/dL (ref 30.0–36.0)
MCV: 82.7 fL (ref 80.0–100.0)
Platelets: 104 10*3/uL — ABNORMAL LOW (ref 150–400)
RBC: 2.49 MIL/uL — ABNORMAL LOW (ref 3.87–5.11)
RDW: 16.9 % — ABNORMAL HIGH (ref 11.5–15.5)
WBC: 10.3 10*3/uL (ref 4.0–10.5)
nRBC: 0 % (ref 0.0–0.2)

## 2023-02-01 LAB — HEMOGLOBIN AND HEMATOCRIT, BLOOD
HCT: 22.3 % — ABNORMAL LOW (ref 36.0–46.0)
Hemoglobin: 7.4 g/dL — ABNORMAL LOW (ref 12.0–15.0)

## 2023-02-01 LAB — LACTIC ACID, PLASMA: Lactic Acid, Venous: 0.8 mmol/L (ref 0.5–1.9)

## 2023-02-01 LAB — MAGNESIUM: Magnesium: 1.7 mg/dL (ref 1.7–2.4)

## 2023-02-01 LAB — PHOSPHORUS: Phosphorus: 4 mg/dL (ref 2.5–4.6)

## 2023-02-01 LAB — MRSA NEXT GEN BY PCR, NASAL: MRSA by PCR Next Gen: NOT DETECTED

## 2023-02-01 MED ORDER — SODIUM CHLORIDE 0.9 % IV SOLN
3.0000 g | Freq: Four times a day (QID) | INTRAVENOUS | Status: DC
  Administered 2023-02-01: 3 g via INTRAVENOUS
  Filled 2023-02-01 (×3): qty 8

## 2023-02-01 MED ORDER — KETOROLAC TROMETHAMINE 15 MG/ML IJ SOLN
15.0000 mg | Freq: Four times a day (QID) | INTRAMUSCULAR | Status: DC | PRN
  Administered 2023-02-01 – 2023-02-05 (×4): 15 mg via INTRAVENOUS
  Filled 2023-02-01 (×4): qty 1

## 2023-02-01 MED ORDER — SODIUM CHLORIDE 0.9 % IV BOLUS
1000.0000 mL | Freq: Once | INTRAVENOUS | Status: AC
  Administered 2023-02-01: 1000 mL via INTRAVENOUS

## 2023-02-01 MED ORDER — MAGNESIUM SULFATE 2 GM/50ML IV SOLN
2.0000 g | Freq: Once | INTRAVENOUS | Status: AC
  Administered 2023-02-01: 2 g via INTRAVENOUS
  Filled 2023-02-01: qty 50

## 2023-02-01 MED ORDER — IOHEXOL 300 MG/ML  SOLN
100.0000 mL | Freq: Once | INTRAMUSCULAR | Status: AC | PRN
  Administered 2023-02-01: 100 mL via INTRAVENOUS

## 2023-02-01 MED ORDER — ORAL CARE MOUTH RINSE: 15.0000 mL | OROMUCOSAL | Status: DC | PRN

## 2023-02-01 MED ORDER — METHYLPREDNISOLONE SODIUM SUCC 125 MG IJ SOLR
125.0000 mg | Freq: Once | INTRAMUSCULAR | Status: AC
  Administered 2023-02-01: 125 mg via INTRAVENOUS
  Filled 2023-02-01: qty 2

## 2023-02-01 MED ORDER — TRAVASOL 10 % IV SOLN
INTRAVENOUS | Status: AC
  Filled 2023-02-01: qty 1056

## 2023-02-01 MED ORDER — SODIUM CHLORIDE 0.9 % IV SOLN
2.0000 g | Freq: Three times a day (TID) | INTRAVENOUS | Status: DC
  Administered 2023-02-01 – 2023-02-05 (×13): 2 g via INTRAVENOUS
  Filled 2023-02-01 (×13): qty 12.5

## 2023-02-01 MED ORDER — SODIUM CHLORIDE (PF) 0.9 % IJ SOLN
INTRAMUSCULAR | Status: AC
  Filled 2023-02-01: qty 50

## 2023-02-01 MED ORDER — METRONIDAZOLE 500 MG/100ML IV SOLN
500.0000 mg | Freq: Two times a day (BID) | INTRAVENOUS | Status: DC
  Administered 2023-02-01 – 2023-02-05 (×9): 500 mg via INTRAVENOUS
  Filled 2023-02-01 (×9): qty 100

## 2023-02-01 MED ORDER — LABETALOL HCL 5 MG/ML IV SOLN
10.0000 mg | Freq: Once | INTRAVENOUS | Status: AC
  Administered 2023-02-01: 10 mg via INTRAVENOUS
  Filled 2023-02-01: qty 4

## 2023-02-01 MED ORDER — LEVALBUTEROL HCL 0.63 MG/3ML IN NEBU: 0.6300 mg | INHALATION_SOLUTION | Freq: Four times a day (QID) | RESPIRATORY_TRACT | Status: DC | PRN

## 2023-02-01 MED ORDER — SODIUM CHLORIDE 0.9% IV SOLUTION: Freq: Once | INTRAVENOUS | Status: AC

## 2023-02-01 MED ORDER — FAMOTIDINE IN NACL 20-0.9 MG/50ML-% IV SOLN
20.0000 mg | Freq: Once | INTRAVENOUS | Status: AC
  Administered 2023-02-01: 20 mg via INTRAVENOUS
  Filled 2023-02-01: qty 50

## 2023-02-01 MED ORDER — VANCOMYCIN HCL 1250 MG/250ML IV SOLN
1250.0000 mg | INTRAVENOUS | Status: DC
  Administered 2023-02-02: 1250 mg via INTRAVENOUS
  Filled 2023-02-01: qty 250

## 2023-02-01 MED ORDER — VANCOMYCIN HCL 1250 MG/250ML IV SOLN
1250.0000 mg | INTRAVENOUS | Status: AC
  Administered 2023-02-01: 1250 mg via INTRAVENOUS
  Filled 2023-02-01: qty 250

## 2023-02-01 MED ORDER — ACETAMINOPHEN 10 MG/ML IV SOLN
1000.0000 mg | Freq: Once | INTRAVENOUS | Status: AC
  Administered 2023-02-01: 1000 mg via INTRAVENOUS
  Filled 2023-02-01: qty 100

## 2023-02-01 NOTE — Progress Notes (Signed)
PHARMACY NOTE -  Unasyn  Pharmacy has been assisting with dosing of Unasyn for aspiration pneumonia. Dosage remains stable at 3g IV q6h and further renal adjustments per institutional Pharmacy antibiotic protocol  Pharmacy will sign off, following peripherally for culture results, dose adjustments, and length of therapy. Please reconsult if a change in clinical status warrants re-evaluation of dosage.  Bernadene Person, PharmD, BCPS [phone] 02/01/2023, 7:51 AM

## 2023-02-01 NOTE — Progress Notes (Signed)
PROGRESS NOTE  Tara Berger JYN:[id number] DOB: Feb 23, 1971 DOA: 01/24/2023 PCP: Pcp, No   LOS: 8 days   Brief Narrative / Interim history: Tara Berger is a 52 y.o. 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 She returned to the emergency department 01/24/23 with complaints of abdominal distention, abdominal pain, nausea and multiple episodes of emesis over the last 2 weeks.  She was able to tolerate drinking fluids until yesterday.  She has been having some loose stools, but no BM in the last 2 days.  At time of admission, imaging revealed high-grade small bowel obstruction.  Patient was admitted to the hospital and general surgery was consulted.  She eventually underwent surgery on 6/17  Subjective / 24h Interval events: Complains of significant chills, feeling cold when I entered the room.  States that overall does not feel good, has audible wheezing.  Assesement and Plan: Principal Problem:   SBO (small bowel obstruction) (HCC) Active Problems:   Crohn's disease (HCC)   Essential hypertension   Left hemiparesis (HCC)   Pulmonary embolism on right (HCC)   Tobacco use disorder   Malnutrition of moderate degree   Principal problem Small bowel obstruction -General surgery consulted, has not improved with conservative management and she was taken to the OR on 6/17 status post ex lap, LOA, resection of old ileocolic anastomosis and formation of a new ileocolic  anastomosis -Continue n.p.o., TPN, management per general surgery.    Active problems SIRS -patient with high fever, tachycardia, chills earlier this morning.  This is roughly into halfway getting a blood transfusion.  Cannot fully rule out transfusion reaction versus sepsis physiology given recent intra-abdominal surgery.  Transfusion stopped due to possible transfusion reaction and given steroids, Benadryl, Pepcid, Tylenol.  Patient with significant hypertension with a blood pressure in the 200s systolic, will give labetalol.  -Obtain blood cultures and start broad-spectrum antibiotics -Obtain CT scan of the chest abdomen pelvis as she has a history of PE and that can also cause tachycardia and fever, and obtain abdomen and pelvis due to recent surgery and current fever  Acute blood loss anemia, following surgery -status post unit of packed red blood cells on 6/18, hemoglobin this morning again at 6.8.  She was transfused up until developed SIRS.  Recheck hemoglobin later this afternoon  Crohn's disease -GI saw patient on 6/15, did not think steroids will be beneficial as this is likely a fibrotic stenosis rather than inflammatory, she has plans to start biologic therapy with outpatient GI through [facility].  History of PE and DVT -she is not on adherence to anticoagulation.  She currently takes Eliquis PRN for "heavy legs" discussed with patient utmost importance to take anticoagulation as prescribed, every day, she is willing to continue Eliquis.  Hold anticoagulation now due to ongoing acute blood loss anemia  Hypomagnesemia - replace and continue to monitor  Tobacco use-continue nicotine patch  Chronic left hemiparesis following stroke December 2023 -encourage mobility, PT signed off.  Will anticoagulate once OK with surgery  Scheduled Meds:  sodium chloride   Intravenous Once   Chlorhexidine  Gluconate Cloth  6 each Topical Daily   HYDROmorphone   Intravenous Q4H   nicotine  14 mg  Transdermal Daily   pantoprazole (PROTONIX) IV  40 mg Intravenous Daily   sodium chloride flush  10-40 mL Intracatheter Q12H   thiamine (VITAMIN B1) injection  100 mg Intravenous Daily   Continuous Infusions:  sodium chloride 40 mL/hr at 02/01/23 1024   acetaminophen     magnesium sulfate bolus IVPB 50 mL/hr at 02/01/23 1024   metronidazole     TPN ADULT (ION) 80 mL/hr at 02/01/23 1024   TPN ADULT (ION)     vancomycin 166.7 mL/hr at 02/01/23 1024   PRN Meds:.diphenhydrAMINE **OR** diphenhydrAMINE, ketorolac, levalbuterol, menthol-cetylpyridinium, naloxone **AND** sodium chloride flush, ondansetron (ZOFRAN) IV, mouth rinse, prochlorperazine, sodium chloride flush  Current Outpatient Medications  Medication Instructions   apixaban (ELIQUIS) 5 mg, Oral, 2 times daily   aspirin 81 mg, Oral, Daily   mirtazapine (REMERON) 15 mg, Oral, Daily at bedtime    Diet Orders (From admission, onward)     Start     Ordered   01/25/23 0830  Diet NPO time specified  Diet effective now        01/25/23 1610            DVT prophylaxis:    Lab Results  Component Value Date   PLT 104 (L) 02/01/2023      Code Status: Full Code  Family Communication: no family at bedside   Status is: Inpatient  Remains inpatient appropriate because: severity of illness  Level of care: Stepdown  Consultants:  General surgery  GI  Objective: Vitals:   02/01/23 1000 02/01/23 1003 02/01/23 1015 02/01/23 1030  BP: 102/69  104/76 102/75  Pulse: (!) 143 (!) 140 (!) 142   Resp: 20 20 (!) 21 18  Temp:   (!) 101 F (38.3 C) 99 F (37.2 C)  TempSrc:   Oral Oral  SpO2: 97% 98% 98%   Weight:      Height:        Intake/Output Summary (Last 24 hours) at 02/01/2023 1038 Last data filed at 02/01/2023 1024 Gross per 24 hour  Intake 4761.64 ml  Output 2550 ml  Net 2211.64 ml    Wt Readings from Last 3 Encounters:  02/01/23 56.5 kg    Examination:  Constitutional: NAD Eyes: lids and  conjunctivae normal, no scleral icterus ENMT: mmm Neck: normal, supple Respiratory: Diffuse bilateral wheezing Cardiovascular: Regular rate and rhythm, no murmurs / rubs / gallops. No LE edema. Abdomen: Bowel sounds positive Skin: no rashes Neurologic: no focal deficits, equal strength  Data Reviewed: I have independently reviewed following labs and imaging studies   CBC Recent Labs  Lab 01/26/23 0450 01/29/23 0958 01/30/23 0118 01/30/23 0339 01/31/23 0118 02/01/23 0353  WBC 6.3 5.5  --  8.7 10.1 10.3  HGB 10.3* 10.2* 8.1* 7.2* 8.0* 6.8*  HCT 33.8* 31.7* 23.7* 22.1* 24.2* 20.6*  PLT 187 130*  --  98* 95* 104*  MCV 93.1 89.8  --  88.0 83.2 82.7  MCH 28.4 28.9  --  28.7 27.5 27.3  MCHC 30.5 32.2  --  32.6 33.1 33.0  RDW 15.0 15.2  --  15.1 17.2* 16.9*     Recent Labs  Lab 01/26/23 0450 01/29/23 0418 01/30/23 0339 01/31/23 0118 02/01/23 0353  NA 137 137 133* 133* 138  K 4.1 2.8* 3.8 3.8 4.3  CL 107 106 107 105 106  CO2 21* 26 22 25  29  GLUCOSE 52* 121* 293* 133* 116*  BUN 11 6 9 7 10   CREATININE 0.66 0.64 0.68 0.51 0.51  CALCIUM 7.8* 6.8* 6.9* 7.5* 7.7*  AST  --  36  --   --  30  ALT  --  25  --   --  27  ALKPHOS  --  56  --   --  62  BILITOT  --  0.6  --   --  0.7  ALBUMIN  --  1.7*  --   --  1.8*  MG  --  1.0* 1.3* 1.9 1.7     ------------------------------------------------------------------------------------------------------------------ No results for input(s): "CHOL", "HDL", "LDLCALC", "TRIG", "CHOLHDL", "LDLDIRECT" in the last 72 hours.   No results found for: "HGBA1C" ------------------------------------------------------------------------------------------------------------------ No results for input(s): "TSH", "T4TOTAL", "T3FREE", "THYROIDAB" in the last 72 hours.  Invalid input(s): "FREET3"  Cardiac Enzymes No results for input(s): "CKMB", "TROPONINI", "MYOGLOBIN" in the last 168 hours.  Invalid input(s):  "CK" ------------------------------------------------------------------------------------------------------------------ No results found for: "BNP"  CBG: Recent Labs  Lab 01/31/23 1310 01/31/23 1643 01/31/23 2359 02/01/23 0507 02/01/23 0631  GLUCAP 104* 127* 137* 133* 128*     Recent Results (from the past 240 hour(s))  Blood culture (routine x 2)     Status: None   Collection Time: 01/24/23  2:52 PM   Specimen: BLOOD  Result Value Ref Range Status   Specimen Description   Final    BLOOD LEFT ANTECUBITAL Performed at [facility], [address], [place], [place] [address]    Special Requests   Final    BOTTLES DRAWN AEROBIC AND ANAEROBIC Blood Culture adequate volume Performed at [facility], [address] [place], [place] [address]    Culture   Final    NO GROWTH 5 DAYS Performed at [facility], [address], [place], [place] [address]    Report Status 01/29/2023 FINAL  Final  Culture, blood (Routine X 2) w Reflex to ID Panel     Status: None   Collection Time: 01/24/23  9:12 PM   Specimen: BLOOD LEFT HAND  Result Value Ref Range Status   Specimen Description   Final    BLOOD LEFT HAND Performed at [facility], [address], [place], [place] [address]    Special Requests   Final    BOTTLES DRAWN AEROBIC AND ANAEROBIC BOTTLES DRAWN AEROBIC ONLY Performed at [facility], [address] [place], [place] [address]    Culture   Final    NO GROWTH 5 DAYS Performed at [facility], [address], [place], [place] [address]    Report Status 01/29/2023 FINAL  Final     Radiology Studies: CT HEAD WO CONTRAST ( )  Result Date: 01/31/2023 CLINICAL DATA:  Altered mental status EXAM: CT HEAD WITHOUT CONTRAST TECHNIQUE: Contiguous axial images were obtained from the base of the skull through the vertex without intravenous contrast. RADIATION DOSE REDUCTION: This exam was performed  according to the departmental dose-optimization program which includes automated exposure control, adjustment of the mA and/or kV according to patient size and/or use of iterative reconstruction technique. COMPARISON:  None Available. FINDINGS: Brain: There is no mass, hemorrhage or extra-axial collection. There is generalized atrophy without lobar predilection. Hypodensity of the white matter is most commonly associated with chronic microvascular disease. There are multiple old bilateral cortical infarcts. Vascular: No abnormal hyperdensity of the major intracranial arteries or dural venous sinuses. No intracranial  atherosclerosis. Skull: The visualized skull base, calvarium and extracranial soft tissues are normal. Sinuses/Orbits: No fluid levels or advanced mucosal thickening of the visualized paranasal sinuses. No mastoid or middle ear effusion. The orbits are normal. IMPRESSION: 1. No acute intracranial abnormality. 2. Multiple old bilateral cortical infarcts. 3. Generalized atrophy and chronic microvascular disease. Electronically Signed   By: Deatra Robinson M.D.   On: 01/31/2023 19:31   DG CHEST PORT 1 VIEW  Result Date: 01/31/2023 CLINICAL DATA:  Cough EXAM: PORTABLE CHEST 1 VIEW COMPARISON:  X-ray 01/24/2023 FINDINGS: Left-sided PICC catheter with tip overlying the upper right atrium. Increasing hazy left midlung opacity with a small left effusion or pleural thickening. Right lung has some basilar atelectasis. No pneumothorax. No separate consolidation in the right lung. Stable cardiopericardial silhouette. IMPRESSION: Left-sided PICC. Increasing left midlung opacity.  Right basilar atelectasis. Tiny left effusion versus pleural thickening Electronically Signed   By: Karen Kays M.D.   On: 01/31/2023 17:08     Pamella Pert, MD, PhD [facility]  Between 7 am - 7 pm I am available, please contact me via Amion (for emergencies) or Securechat (non urgent messages)  Between 7 pm - 7 am I am  not available, please contact night coverage MD/APP via Amion

## 2023-02-01 NOTE — Progress Notes (Signed)
    Patient Name: Tara Berger           DOB: 1970-08-25  MRN: [id number]      Admission Date: 01/24/2023  Attending Provider: Leatha Gilding, MD  Primary Diagnosis: SBO (small bowel obstruction) [facility])   Level of care: Med-Surg    CROSS COVER NOTE   Date of Service   02/01/2023   Lachaka Masser, 52 y.o. female, was admitted on 01/24/2023 for SBO (small bowel obstruction) (HCC).    HPI/Events of Note   Acute Blood Loss Anemia, initially noted following surgery Hemoglobin  8.0 -->  6.8.  Received 1 unit PRBC on 6/18  No acute changes reported.  Hemodynamically stable. No melena, hematochezia, or other bleeding reported tonight.    Interventions/ Plan   Blood transfusion, 1 unit PRBC         Anthoney Harada, DNP, [employer] [facility] [place]

## 2023-02-01 NOTE — Progress Notes (Signed)
PHARMACY - TOTAL PARENTERAL NUTRITION CONSULT NOTE   Indication: Small bowel obstruction  Patient Measurements: Height: 5\' 2"  (157.5 cm) Weight: 55.3 kg (121 lb 14.6 oz) IBW/kg (Calculated) : 50.1 TPN AdjBW (KG): 59.9 Body mass index is 22.3 kg/m.  Assessment:  Pharmacy is consulted to start TPN on 52 yo female admitted with abdominal pain, distention, and nausea with episodes of emesis over the past 2 weeks. Extensive history of small bowel stricturing and prior surgeries d/t Crohn's disease. CT shows distal SBO. Recent admission in March 2024 for SBO with NGT & TPN, plans for surgery, but pt left AMA. Now with tentative plans for surgery on 6/17.   Glucose / Insulin: no hx of DM  - CBGs well controlled (goal 100-150); apart from brief spike after postop Decadron, using 3 or fewer units of SSI daily Electrolytes: Na improved to WNL yesterday after increasing in TPN; all others stable WNL, although Phos drifting up and Mg drifting down Renal: SCr, BUN stable WNL; robust UOP Hepatic: LFTs, Tbili stable WNL; albumin low and stable - TG WNL (6/17) I/O: +10 L this admission (per charting) - N/v yesterday and possible aspiration event today; anticipate NG will need to be replaced - 2 BMs since surgery on 6/17 - MIVF: NS @ 50 ml/hr  GI Imaging: - 6/13 CTa/p: high-grade distal small-bowel obstruction d/t stricture vs adhesions; no active Crohn's dz noted - 6/15 AXR: persistent SBO GI Surgeries / Procedures:  - 6/17: Ex lap with lysis of adhesions, resection of prior ileocolic anastomosis (which had become strictured), new ileocolic anastomosis. 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  Central access: PICC TPN start date: 6/16   Nutritional Goals: Current TPN formulation (55 g AA, 15% CHO, 30 g/L lipid) at 80 ml/hr provides 105 g protein, 1974 kcal, 1920 mL daily - Keep K >/= 4.0; Mg >/= 2.0 for ileus  RD  Assessment: Estimated Needs Total Energy Estimated Needs: 1800-2000 Total Protein Estimated Needs: 90-105g Total Fluid Estimated Needs: 2L/day  Current Nutrition:  NPO, TPN  Plan:  - Mg 2g IV x 1  Continue TPN at goal 80 mL/hr Electrolytes in TPN: decreased Ac Na 100 mEq/L  K 50 mEq/L  Ca 5 mEq/L Mg 10 mEq/L Phos 15 mmol/L  Cl:Ac = 1:2 Add standard MVI and trace elements to TPN Receiving thiamine 100 mg IV daily x 5 outside TPN, given refeeding risk Decrease MIVF to NS 40 ml/hr at 1800; further changes per MD Will discontinue SSI given well controlled CBGs and TPN stable at goal Monitor TPN labs on Mon/Thurs Bmet, Mag, Phos tomorrow  Bernadene Person, PharmD, BCPS [phone] 02/01/2023, 7:49 AM

## 2023-02-01 NOTE — Progress Notes (Addendum)
3 Days Post-Op  Subjective: CC: Seen with TRH and RN.  TRH reports AMS yesterday. CTH negative for acute process.  Noted to have wheezing yesterday. CXR w/ possible pna. Started on Unasyn by TRH.  Fever to 101 this am. Tachycardic. Hypertensive > given Labetalol 10mg  > last BP 105/72. Hgb 6.8. Getting 1U PRBC.  Fever resolved after IV Tylenol.  Per discussion with TRH unsure if related to blood transfusion rxn? Given Solumedrol, pepcid and benadryl. Also getting repeat CXR and Bcx. UA reassuring yesterday (no nutrates, small leukocytes, 6-10wbc, no bacteria). Ucx ordered.   She is A&O x3. Patient reports stable to slightly worse incisional pain, especially at the top of her incision. No other areas of abdominal pain. N/v resolved. Passing flatus and had 2 bm's yesterday. Patient and RN report these were non-bloody and non-melanous. She reports sob. No cp. + cough with clear sputum. No urinary symptoms. No LE edema or pain.   Objective: Vital signs in last 24 hours: Temp:  [98 F (36.7 C)-101 F (38.3 C)] 99.2 F (37.3 C) (06/20 0902) Pulse Rate:  [81-188] 144 (06/20 0902) Resp:  [18-22] 22 (06/20 0902) BP: (99-219)/(62-137) 105/72 (06/20 0902) SpO2:  [93 %-100 %] 100 % (06/20 0902) Weight:  [55.3 kg] 55.3 kg (06/20 0431) Last BM Date : 01/31/23  Intake/Output from previous day: 06/19 0701 - 06/20 0700 In: 3301.3 [I.V.:2758.4; Blood:33; IV Piggyback:509.9] Out: 2350 [Urine:2350] Intake/Output this shift: No intake/output data recorded.  PE: Gen:  Alert, NAD, pleasant Card:  Tachycardic Pulm:  CTAB, no W/R/R, effort normal. On o2 Abd: Soft, less disetnsion, appropriately tender around the top of her incision/epigastrium, no rigidity or guarding and otherwise NT. + BS. Midline wound with staples in place, cdi without surrounding skin changes or drainage.  GU: cannister with straw colored urine  Ext:  No LE edema or calf tenderness. MAE's Psych: A&Ox3   Lab Results:   Recent Labs    01/31/23 0118 02/01/23 0353  WBC 10.1 10.3  HGB 8.0* 6.8*  HCT 24.2* 20.6*  PLT 95* 104*   BMET Recent Labs    01/31/23 0118 02/01/23 0353  NA 133* 138  K 3.8 4.3  CL 105 106  CO2 25 29  GLUCOSE 133* 116*  BUN 7 10  CREATININE 0.51 0.51  CALCIUM 7.5* 7.7*   PT/INR No results for input(s): "LABPROT", "INR" in the last 72 hours. CMP     Component Value Date/Time   NA 138 02/01/2023 0353   K 4.3 02/01/2023 0353   CL 106 02/01/2023 0353   CO2 29 02/01/2023 0353   GLUCOSE 116 (H) 02/01/2023 0353   BUN 10 02/01/2023 0353   CREATININE 0.51 02/01/2023 0353   CALCIUM 7.7 (L) 02/01/2023 0353   PROT 4.5 (L) 02/01/2023 0353   ALBUMIN 1.8 (L) 02/01/2023 0353   AST 30 02/01/2023 0353   ALT 27 02/01/2023 0353   ALKPHOS 62 02/01/2023 0353   BILITOT 0.7 02/01/2023 0353   GFRNONAA >60 02/01/2023 0353   GFRAA  06/15/2010 0447    >60        The eGFR has been calculated using the MDRD equation. This calculation has not been validated in all clinical situations. eGFR's persistently <60 mL/min signify possible Chronic Kidney Disease.   Lipase     Component Value Date/Time   LIPASE 22 01/24/2023 1240    Studies/Results: CT HEAD WO CONTRAST ( )  Result Date: 01/31/2023 CLINICAL DATA:  Altered mental status EXAM: CT HEAD WITHOUT  CONTRAST TECHNIQUE: Contiguous axial images were obtained from the base of the skull through the vertex without intravenous contrast. RADIATION DOSE REDUCTION: This exam was performed according to the departmental dose-optimization program which includes automated exposure control, adjustment of the mA and/or kV according to patient size and/or use of iterative reconstruction technique. COMPARISON:  None Available. FINDINGS: Brain: There is no mass, hemorrhage or extra-axial collection. There is generalized atrophy without lobar predilection. Hypodensity of the white matter is most commonly associated with chronic microvascular disease.  There are multiple old bilateral cortical infarcts. Vascular: No abnormal hyperdensity of the major intracranial arteries or dural venous sinuses. No intracranial atherosclerosis. Skull: The visualized skull base, calvarium and extracranial soft tissues are normal. Sinuses/Orbits: No fluid levels or advanced mucosal thickening of the visualized paranasal sinuses. No mastoid or middle ear effusion. The orbits are normal. IMPRESSION: 1. No acute intracranial abnormality. 2. Multiple old bilateral cortical infarcts. 3. Generalized atrophy and chronic microvascular disease. Electronically Signed   By: Deatra Robinson M.D.   On: 01/31/2023 19:31   DG CHEST PORT 1 VIEW  Result Date: 01/31/2023 CLINICAL DATA:  Cough EXAM: PORTABLE CHEST 1 VIEW COMPARISON:  X-ray 01/24/2023 FINDINGS: Left-sided PICC catheter with tip overlying the upper right atrium. Increasing hazy left midlung opacity with a small left effusion or pleural thickening. Right lung has some basilar atelectasis. No pneumothorax. No separate consolidation in the right lung. Stable cardiopericardial silhouette. IMPRESSION: Left-sided PICC. Increasing left midlung opacity.  Right basilar atelectasis. Tiny left effusion versus pleural thickening Electronically Signed   By: Karen Kays M.D.   On: 01/31/2023 17:08    Anti-infectives: Anti-infectives (From admission, onward)    Start     Dose/Rate Route Frequency Ordered Stop   02/01/23 0830  Ampicillin-Sulbactam (UNASYN) 3 g in sodium chloride 0.9 % 100 mL IVPB        3 g 200 mL/hr over 30 Minutes Intravenous Every 6 hours 02/01/23 0744     01/29/23 0800  cefoTEtan (CEFOTAN) 2 g in sodium chloride 0.9 % 100 mL IVPB        2 g 200 mL/hr over 30 Minutes Intravenous On call to O.R. 01/28/23 0815 01/29/23 1301      Path  FINAL MICROSCOPIC DIAGNOSIS:   A. SMALL BOWEL, ANASTOMOTIC STRICTURE, RESECTION:  Fibromuscular hypertrophy with adhesions and proximal dilatation  consistent with anastomotic  stricture.  Negative for malignancy.   Assessment/Plan POD 3 s/p ex lap, loa, resection of ileocolic anastomosis with new ileocolic anastomosis for sbo 2/2 strictured ileocolic anastomosis by Dr. Magnus Ivan 01/29/23 - Path benign - Having bowel function. Start sips and chips if okay with TRH (will make sure she doesn't need slp to see first with their concern of aspiration and prior CVA) - Cont TPN - D/c PCA when tolerating PO - Ambulate, PT. Currently rec for [facility] PT - Pulm toilet.  ABL anemia - s/p 1U PRBC 6/18 -> hgb 6.8 this am. Getting 1U PRBC   FEN - As above, TPN, IVF per TRH VTE - SCDs, anticoagulation on hold for abl anemia and thrombocytopenia ID - Cefotetan peri-op. Fever to 101 this am. On Unasyn for possible pna. TRH ? Blood transfusion rxn as etiology. BCx, Ucx pending. Resp cx also ordered. Discussed with attending, would hold off on CT scan at this time. We will follow closely.  Foley - Out, voiding.  Plan - As above.    - Per TRH -  Fever - appreciate TRH assistance in w/u AMS -  Per report 6/19. CTH negative for acute process.  Hx Crohn's - Per GI. Per note, has plans to start biologic therapy in outpatient setting with primary GI team (through [facility]) Hx HTN Hx CVA w/ Chronic left hemiparesis  Hx PE/DVT - Per notes, dx in Feb this year. TRH plans to tx when ABL anemia stable/improved.   Addendum: Discussed with TRH over the phone. With hx of PE/DVT they are planning to get CTA chest. As she is already getting CT imaging + contrast load will have them add on CT A/P. Will follow up on this.    LOS: 8 days    [name] , [facility] 02/01/2023, 9:23 AM Please see Amion for pager number during day hours 7:00am-4:30pm

## 2023-02-01 NOTE — Progress Notes (Signed)
Pharmacy Antibiotic Note  Tara Berger is a 52 y.o. female admitted on 01/24/2023 with small bowel obstruction.  Underwent exploratory laparotomy; lysis of adhesions, resection of ileocolic anastomosis, new ileocolic anastomosis surgery on 6/17.  PICC placed 6/16 and TPN initiated yesterday. Started on Unasyn this am for possible asp pna but then later today, Unasyn stopped and Pharmacy has been consulted for cefepime and vancomycin dosing for possible sepsis.  Plan: Cefepime 2 g IV q8h Flagyl 500 mg IV q12h per provider Vancomycin 1250 mg IV q24h (Goal AUC 400-550, eAUC 520.7, SCr used: rounded up to 0.8) Monitor clinical progress, renal function, vancomycin levels as indicated F/U C&S, abx deescalation / LOT   Height: 5\' 2"  (157.5 cm) Weight: 56.5 kg (124 lb 9 oz) IBW/kg (Calculated) : 50.1  Temp (24hrs), Avg:99.3 F (37.4 C), Min:98.2 F (36.8 C), Max:101 F (38.3 C)  Recent Labs  Lab 01/26/23 0450 01/29/23 0418 01/29/23 0958 01/30/23 0339 01/31/23 0118 02/01/23 0353 02/01/23 1156  WBC 6.3  --  5.5 8.7 10.1 10.3  --   CREATININE 0.66 0.64  --  0.68 0.51 0.51  --   LATICACIDVEN  --   --   --   --   --   --  0.8    Estimated Creatinine Clearance: 65.8 mL/min (by C-G formula based on SCr of 0.51 mg/dL).    Allergies  Allergen Reactions   Latex     Antimicrobials this admission: 6/17 Cefotetan x 1 for surgical prophylaxis 6/20 Unasyn x 1 6/20 cefepime and Flagyl >>  6/20 vancomycin >>   Microbiology results: 6/20 BCx: sent 6/12 Bcx: NGTD 6/19 UCx: multiple species present, suggest recollection  6/20 MRSA PCR: not detected   Thank you for allowing pharmacy to be a part of this patient's care.  Selinda Eon, PharmD, BCPS Clinical Pharmacist [place] 02/01/2023 1:03 PM

## 2023-02-01 NOTE — Progress Notes (Signed)
PT Cancellation Note  Patient Details Name: Tara Berger MRN: [id number] DOB: 08-16-1970   Cancelled Treatment:     HgB 6.8 plus pt transferred to step down unit.  Will hold off on Physical Therapy today and attempt to see another as schedule permits.   Felecia Shelling  PTA Acute  Rehabilitation Services Office M-F          [phone]

## 2023-02-01 NOTE — Progress Notes (Signed)
Patient had signs of reaction to blood product. Blood stopped, new orders given per MD. See MAR for info. Rapid nurse contacted, came to bedside, patient was stable and transferred to higher level of care. Report given to ICU nurse.

## 2023-02-02 DIAGNOSIS — K56609 Unspecified intestinal obstruction, unspecified as to partial versus complete obstruction: Secondary | ICD-10-CM | POA: Diagnosis not present

## 2023-02-02 LAB — CBC
HCT: 21.3 % — ABNORMAL LOW (ref 36.0–46.0)
Hemoglobin: 7 g/dL — ABNORMAL LOW (ref 12.0–15.0)
MCH: 27.8 pg (ref 26.0–34.0)
MCHC: 32.9 g/dL (ref 30.0–36.0)
MCV: 84.5 fL (ref 80.0–100.0)
Platelets: 103 10*3/uL — ABNORMAL LOW (ref 150–400)
RBC: 2.52 MIL/uL — ABNORMAL LOW (ref 3.87–5.11)
RDW: 16.8 % — ABNORMAL HIGH (ref 11.5–15.5)
WBC: 8.6 10*3/uL (ref 4.0–10.5)
nRBC: 0 % (ref 0.0–0.2)

## 2023-02-02 LAB — GLUCOSE, CAPILLARY
Glucose-Capillary: 152 mg/dL — ABNORMAL HIGH (ref 70–99)
Glucose-Capillary: 155 mg/dL — ABNORMAL HIGH (ref 70–99)
Glucose-Capillary: 164 mg/dL — ABNORMAL HIGH (ref 70–99)
Glucose-Capillary: 174 mg/dL — ABNORMAL HIGH (ref 70–99)
Glucose-Capillary: 189 mg/dL — ABNORMAL HIGH (ref 70–99)
Glucose-Capillary: 202 mg/dL — ABNORMAL HIGH (ref 70–99)

## 2023-02-02 LAB — TYPE AND SCREEN

## 2023-02-02 LAB — MAGNESIUM: Magnesium: 2.6 mg/dL — ABNORMAL HIGH (ref 1.7–2.4)

## 2023-02-02 LAB — COMPREHENSIVE METABOLIC PANEL
ALT: 36 U/L (ref 0–44)
AST: 40 U/L (ref 15–41)
Albumin: 1.8 g/dL — ABNORMAL LOW (ref 3.5–5.0)
Alkaline Phosphatase: 83 U/L (ref 38–126)
Anion gap: 7 (ref 5–15)
BUN: 20 mg/dL (ref 6–20)
CO2: 27 mmol/L (ref 22–32)
Calcium: 7.6 mg/dL — ABNORMAL LOW (ref 8.9–10.3)
Chloride: 105 mmol/L (ref 98–111)
Creatinine, Ser: 0.64 mg/dL (ref 0.44–1.00)
GFR, Estimated: >60 mL/min (ref >60)
Glucose, Bld: 173 mg/dL — ABNORMAL HIGH (ref 70–99)
Potassium: 5.2 mmol/L — ABNORMAL HIGH (ref 3.5–5.1)
Sodium: 139 mmol/L (ref 135–145)
Total Bilirubin: 0.8 mg/dL (ref 0.3–1.2)
Total Protein: 5 g/dL — ABNORMAL LOW (ref 6.5–8.1)

## 2023-02-02 LAB — PROTIME-INR
INR: 1.1 (ref 0.8–1.2)
Prothrombin Time: 14.1 seconds (ref 11.4–15.2)

## 2023-02-02 LAB — HEMOGLOBIN AND HEMATOCRIT, BLOOD
HCT: 26.8 % — ABNORMAL LOW (ref 36.0–46.0)
Hemoglobin: 8.8 g/dL — ABNORMAL LOW (ref 12.0–15.0)

## 2023-02-02 LAB — BPAM RBC
Blood Product Expiration Date: 202406202359
ISSUE DATE / TIME: 202406181316
Unit Type and Rh: 7300

## 2023-02-02 LAB — PHOSPHORUS: Phosphorus: 4.3 mg/dL (ref 2.5–4.6)

## 2023-02-02 LAB — CULTURE, BLOOD (ROUTINE X 2): Culture: NO GROWTH

## 2023-02-02 LAB — PREPARE RBC (CROSSMATCH)

## 2023-02-02 MED ORDER — TRAVASOL 10 % IV SOLN
INTRAVENOUS | Status: AC
  Filled 2023-02-02: qty 1056

## 2023-02-02 MED ORDER — SODIUM CHLORIDE 0.9% IV SOLUTION: Freq: Once | INTRAVENOUS | Status: AC

## 2023-02-02 MED ORDER — METHYLPREDNISOLONE SODIUM SUCC 40 MG IJ SOLR
40.0000 mg | Freq: Once | INTRAMUSCULAR | Status: AC
  Administered 2023-02-02: 40 mg via INTRAVENOUS
  Filled 2023-02-02: qty 1

## 2023-02-02 MED ORDER — SODIUM CHLORIDE 0.9 % IV BOLUS
500.0000 mL | Freq: Once | INTRAVENOUS | Status: AC
  Administered 2023-02-02: 500 mL via INTRAVENOUS

## 2023-02-02 MED ORDER — ACETAMINOPHEN 10 MG/ML IV SOLN
1000.0000 mg | Freq: Once | INTRAVENOUS | Status: AC
  Administered 2023-02-02: 1000 mg via INTRAVENOUS
  Filled 2023-02-02: qty 100

## 2023-02-02 MED ORDER — ACETAMINOPHEN 500 MG PO TABS
1000.0000 mg | ORAL_TABLET | Freq: Four times a day (QID) | ORAL | Status: DC | PRN
  Administered 2023-02-04: 1000 mg via ORAL
  Filled 2023-02-02 (×2): qty 2

## 2023-02-02 NOTE — Progress Notes (Signed)
4 Days Post-Op  Subjective: CC: Afebrile over the last 24 hours. Still tachycardic. Soft BP resolved. Getting additional unit of PRBC (hgb 7.0).   Reports no abdominal pain currently. PCA is helping with the pain she has at the top of her incision at time. No n/v. No bm yesterday.   Objective: Vital signs in last 24 hours: Temp:  [97.8 F (36.6 C)-98.4 F (36.9 C)] 98.3 F (36.8 C) (06/21 0840) Pulse Rate:  [104-124] 123 (06/21 0800) Resp:  [10-24] 24 (06/21 1100) BP: (84-150)/(56-104) 150/104 (06/21 1100) SpO2:  [90 %-100 %] 100 % (06/21 0800) FiO2 (%):  [26 %] 26 % (06/21 0800) Weight:  [59.5 kg] 59.5 kg (06/21 0415) Last BM Date : 01/31/23  Intake/Output from previous day: 06/20 0701 - 06/21 0700 In: 5131.9 [I.V.:2785.3; Blood:449; IV Piggyback:1897.6] Out: 800 [Urine:800] Intake/Output this shift: No intake/output data recorded.  PE: Gen:  Alert, NAD, pleasant Abd: Soft, mild disetnsion, appropriately tender around the top of her incision/epigastrium, no rigidity or guarding and otherwise NT. + BS. Midline wound with staples in place, cdi without surrounding skin changes or drainage.   Lab Results:  Recent Labs    02/01/23 0353 02/01/23 1156 02/02/23 0414  WBC 10.3  --  8.6  HGB 6.8* 7.4* 7.0*  HCT 20.6* 22.3* 21.3*  PLT 104*  --  103*    BMET Recent Labs    02/01/23 0353 02/02/23 0414  NA 138 139  K 4.3 5.2*  CL 106 105  CO2 29 27  GLUCOSE 116* 173*  BUN 10 20  CREATININE 0.51 0.64  CALCIUM 7.7* 7.6*    PT/INR Recent Labs    02/02/23 0414  LABPROT 14.1  INR 1.1   CMP     Component Value Date/Time   NA 139 02/02/2023 0414   K 5.2 (H) 02/02/2023 0414   CL 105 02/02/2023 0414   CO2 27 02/02/2023 0414   GLUCOSE 173 (H) 02/02/2023 0414   BUN 20 02/02/2023 0414   CREATININE 0.64 02/02/2023 0414   CALCIUM 7.6 (L) 02/02/2023 0414   PROT 5.0 (L) 02/02/2023 0414   ALBUMIN 1.8 (L) 02/02/2023 0414   AST 40 02/02/2023 0414   ALT 36  02/02/2023 0414   ALKPHOS 83 02/02/2023 0414   BILITOT 0.8 02/02/2023 0414   GFRNONAA >60 02/02/2023 0414   GFRAA  06/15/2010 0447    >60        The eGFR has been calculated using the MDRD equation. This calculation has not been validated in all clinical situations. eGFR's persistently <60 mL/min signify possible Chronic Kidney Disease.   Lipase     Component Value Date/Time   LIPASE 22 01/24/2023 1240    Studies/Results: CT Angio Chest Pulmonary Embolism (PE) W or WO Contrast  Result Date: 02/01/2023 CLINICAL DATA:  Recent surgery for small bowel obstruction. Postoperative abdominal pain. EXAM: CT ANGIOGRAPHY CHEST CT ABDOMEN AND PELVIS WITH CONTRAST TECHNIQUE: Multidetector CT imaging of the chest was performed using the standard protocol during bolus administration of intravenous contrast. Multiplanar CT image reconstructions and MIPs were obtained to evaluate the vascular anatomy. Multidetector CT imaging of the abdomen and pelvis was performed using the standard protocol during bolus administration of intravenous contrast. RADIATION DOSE REDUCTION: This exam was performed according to the departmental dose-optimization program which includes automated exposure control, adjustment of the mA and/or kV according to patient size and/or use of iterative reconstruction technique. CONTRAST:  OMNIPAQUE IOHEXOL 300 MG/ML  SOLN COMPARISON:  CT abdomen/pelvis 01/25/2023 FINDINGS: CTA CHEST FINDINGS Cardiovascular: The heart is normal in size. No pericardial effusion. The aorta is normal in caliber. No dissection. Scattered atherosclerotic calcifications at the aortic arch. Age advanced three-vessel coronary artery calcifications are noted. The pulmonary arterial tree is well opacified. No filling defects to suggest pulmonary embolism. Mild enlargement of the pulmonary arteries may suggest pulmonary hypertension. Mediastinum/Nodes: No mediastinal or hilar mass or lymphadenopathy. The esophagus  is grossly. The left subclavian central venous catheter is in good position. Lungs/Pleura: Small bilateral pleural effusions, right larger than left with areas of overlying subsegmental atelectasis. Underlying emphysematous changes and pulmonary scarring. Centrilobular upper lobe nodularity greater on the right likely suggesting respiratory bronchiolitis. Musculoskeletal: No significant bony findings. Review of the MIP images confirms the above findings. CT ABDOMEN and PELVIS FINDINGS Hepatobiliary: Mild periportal edema and a small amount fluid around the gallbladder likely a postsurgical change. No hepatic lesions or intrahepatic biliary dilatation. The portal and hepatic veins are patent. Pancreas: Normal Spleen: Normal Adrenals/Urinary Tract: Normal Stomach/Bowel: The stomach and duodenum are unremarkable. The proximal and mid small bowel loops are normal in caliber. Slight dilatation of the more distal small bowel loops but no findings for recurrent obstruction. Contrast extends all the way into the colon. Surgical changes at the ileocolonic anastomosis. Expected inflammations/edema and scattered surrounding fluid but no findings suspicious for a postoperative abscess. No leaking oral contrast is identified. One small dot of air near the anastomosis is not unexpected. Vascular/Lymphatic: The aorta and branch vessels are patent. The major venous structures are patent. Stable IVC filter. No mesenteric or retroperitoneal mass or adenopathy. Reproductive: The uterus and ovaries are unremarkable. Other: Small amount of free abdominal and free pelvic fluid, not unexpected after surgery. No hematoma or rim enhancing abscess. Musculoskeletal: No significant bony findings. Review of the MIP images confirms the above findings. IMPRESSION: 1. No CT findings for pulmonary embolism. 2. Small bilateral pleural effusions, right larger than left with areas of overlying subsegmental atelectasis. 3. Underlying emphysematous  changes and pulmonary scarring. 4. Centrilobular upper lobe nodularity greater on the right likely suggesting respiratory bronchiolitis. 5. Surgical changes at the ileocolonic anastomosis. Expected inflammation/edema and scattered surrounding fluid but no findings suspicious for a postoperative abscess. No leaking oral contrast is identified. 6. Small amount of free abdominal and free pelvic fluid, not unexpected after surgery. 7. Aortic atherosclerosis. Aortic Atherosclerosis (ICD10-I70.0) and Emphysema (ICD10-J43.9). But Electronically Signed   By: Rudie Meyer M.D.   On: 02/01/2023 12:09   CT ABDOMEN PELVIS W CONTRAST  Result Date: 02/01/2023 CLINICAL DATA:  Recent surgery for small bowel obstruction. Postoperative abdominal pain. EXAM: CT ANGIOGRAPHY CHEST CT ABDOMEN AND PELVIS WITH CONTRAST TECHNIQUE: Multidetector CT imaging of the chest was performed using the standard protocol during bolus administration of intravenous contrast. Multiplanar CT image reconstructions and MIPs were obtained to evaluate the vascular anatomy. Multidetector CT imaging of the abdomen and pelvis was performed using the standard protocol during bolus administration of intravenous contrast. RADIATION DOSE REDUCTION: This exam was performed according to the departmental dose-optimization program which includes automated exposure control, adjustment of the mA and/or kV according to patient size and/or use of iterative reconstruction technique. CONTRAST:  OMNIPAQUE IOHEXOL 300 MG/ML  SOLN COMPARISON:  CT abdomen/pelvis 01/25/2023 FINDINGS: CTA CHEST FINDINGS Cardiovascular: The heart is normal in size. No pericardial effusion. The aorta is normal in caliber. No dissection. Scattered atherosclerotic calcifications at the aortic arch. Age advanced three-vessel coronary artery calcifications are noted. The pulmonary  arterial tree is well opacified. No filling defects to suggest pulmonary embolism. Mild enlargement of the  pulmonary arteries may suggest pulmonary hypertension. Mediastinum/Nodes: No mediastinal or hilar mass or lymphadenopathy. The esophagus is grossly. The left subclavian central venous catheter is in good position. Lungs/Pleura: Small bilateral pleural effusions, right larger than left with areas of overlying subsegmental atelectasis. Underlying emphysematous changes and pulmonary scarring. Centrilobular upper lobe nodularity greater on the right likely suggesting respiratory bronchiolitis. Musculoskeletal: No significant bony findings. Review of the MIP images confirms the above findings. CT ABDOMEN and PELVIS FINDINGS Hepatobiliary: Mild periportal edema and a small amount fluid around the gallbladder likely a postsurgical change. No hepatic lesions or intrahepatic biliary dilatation. The portal and hepatic veins are patent. Pancreas: Normal Spleen: Normal Adrenals/Urinary Tract: Normal Stomach/Bowel: The stomach and duodenum are unremarkable. The proximal and mid small bowel loops are normal in caliber. Slight dilatation of the more distal small bowel loops but no findings for recurrent obstruction. Contrast extends all the way into the colon. Surgical changes at the ileocolonic anastomosis. Expected inflammations/edema and scattered surrounding fluid but no findings suspicious for a postoperative abscess. No leaking oral contrast is identified. One small dot of air near the anastomosis is not unexpected. Vascular/Lymphatic: The aorta and branch vessels are patent. The major venous structures are patent. Stable IVC filter. No mesenteric or retroperitoneal mass or adenopathy. Reproductive: The uterus and ovaries are unremarkable. Other: Small amount of free abdominal and free pelvic fluid, not unexpected after surgery. No hematoma or rim enhancing abscess. Musculoskeletal: No significant bony findings. Review of the MIP images confirms the above findings. IMPRESSION: 1. No CT findings for pulmonary embolism. 2.  Small bilateral pleural effusions, right larger than left with areas of overlying subsegmental atelectasis. 3. Underlying emphysematous changes and pulmonary scarring. 4. Centrilobular upper lobe nodularity greater on the right likely suggesting respiratory bronchiolitis. 5. Surgical changes at the ileocolonic anastomosis. Expected inflammation/edema and scattered surrounding fluid but no findings suspicious for a postoperative abscess. No leaking oral contrast is identified. 6. Small amount of free abdominal and free pelvic fluid, not unexpected after surgery. 7. Aortic atherosclerosis. Aortic Atherosclerosis (ICD10-I70.0) and Emphysema (ICD10-J43.9). But Electronically Signed   By: Rudie Meyer M.D.   On: 02/01/2023 12:09   DG CHEST PORT 1 VIEW  Result Date: 02/01/2023 CLINICAL DATA:  Wheezing. EXAM: PORTABLE CHEST 1 VIEW COMPARISON:  January 31, 2023. FINDINGS: Stable cardiomediastinal silhouette. Left-sided PICC line is noted with tip in expected position of cavoatrial junction. Minimal right basilar subsegmental atelectasis is noted. Stable left midlung scarring or atelectasis is noted with associated pleural thickening. Stable left basilar atelectasis or scarring is noted with small left pleural effusion or scarring. Bony thorax is unremarkable. IMPRESSION: Stable findings seen involving left lung. Minimal right basilar subsegmental atelectasis is noted. Electronically Signed   By: Lupita Raider M.D.   On: 02/01/2023 11:46   CT HEAD WO CONTRAST ( )  Result Date: 01/31/2023 CLINICAL DATA:  Altered mental status EXAM: CT HEAD WITHOUT CONTRAST TECHNIQUE: Contiguous axial images were obtained from the base of the skull through the vertex without intravenous contrast. RADIATION DOSE REDUCTION: This exam was performed according to the departmental dose-optimization program which includes automated exposure control, adjustment of the mA and/or kV according to patient size and/or use of iterative  reconstruction technique. COMPARISON:  None Available. FINDINGS: Brain: There is no mass, hemorrhage or extra-axial collection. There is generalized atrophy without lobar predilection. Hypodensity of the white matter is most commonly associated  with chronic microvascular disease. There are multiple old bilateral cortical infarcts. Vascular: No abnormal hyperdensity of the major intracranial arteries or dural venous sinuses. No intracranial atherosclerosis. Skull: The visualized skull base, calvarium and extracranial soft tissues are normal. Sinuses/Orbits: No fluid levels or advanced mucosal thickening of the visualized paranasal sinuses. No mastoid or middle ear effusion. The orbits are normal. IMPRESSION: 1. No acute intracranial abnormality. 2. Multiple old bilateral cortical infarcts. 3. Generalized atrophy and chronic microvascular disease. Electronically Signed   By: Deatra Robinson M.D.   On: 01/31/2023 19:31   DG CHEST PORT 1 VIEW  Result Date: 01/31/2023 CLINICAL DATA:  Cough EXAM: PORTABLE CHEST 1 VIEW COMPARISON:  X-ray 01/24/2023 FINDINGS: Left-sided PICC catheter with tip overlying the upper right atrium. Increasing hazy left midlung opacity with a small left effusion or pleural thickening. Right lung has some basilar atelectasis. No pneumothorax. No separate consolidation in the right lung. Stable cardiopericardial silhouette. IMPRESSION: Left-sided PICC. Increasing left midlung opacity.  Right basilar atelectasis. Tiny left effusion versus pleural thickening Electronically Signed   By: Karen Kays M.D.   On: 01/31/2023 17:08    Anti-infectives: Anti-infectives (From admission, onward)    Start     Dose/Rate Route Frequency Ordered Stop   02/02/23 1000  vancomycin (VANCOREADY) IVPB 1250 mg/250 mL        1,250 mg 166.7 mL/hr over 90 Minutes Intravenous Every 24 hours 02/01/23 1246     02/01/23 1200  ceFEPIme (MAXIPIME) 2 g in sodium chloride 0.9 % 100 mL IVPB        2 g 200 mL/hr over 30  Minutes Intravenous Every 8 hours 02/01/23 1106     02/01/23 1100  vancomycin (VANCOREADY) IVPB 1250 mg/250 mL        1,250 mg 166.7 mL/hr over 90 Minutes Intravenous NOW 02/01/23 1001 02/01/23 1148   02/01/23 1015  metroNIDAZOLE (FLAGYL) IVPB 500 mg        500 mg 100 mL/hr over 60 Minutes Intravenous 2 times daily 02/01/23 1009     02/01/23 0830  Ampicillin-Sulbactam (UNASYN) 3 g in sodium chloride 0.9 % 100 mL IVPB  Status:  Discontinued        3 g 200 mL/hr over 30 Minutes Intravenous Every 6 hours 02/01/23 0744 02/01/23 1010   01/29/23 0800  cefoTEtan (CEFOTAN) 2 g in sodium chloride 0.9 % 100 mL IVPB        2 g 200 mL/hr over 30 Minutes Intravenous On call to O.R. 01/28/23 0815 01/29/23 1301      Path  FINAL MICROSCOPIC DIAGNOSIS:   A. SMALL BOWEL, ANASTOMOTIC STRICTURE, RESECTION:  Fibromuscular hypertrophy with adhesions and proximal dilatation  consistent with anastomotic stricture.  Negative for malignancy.   Assessment/Plan POD 4 s/p ex lap, loa, resection of ileocolic anastomosis with new ileocolic anastomosis for sbo 2/2 strictured ileocolic anastomosis by Dr. Magnus Ivan 01/29/23 - Path benign - CT 6/20 reassuring with no evidence of abscess or leak.  - Adv to CLD - Cont TPN - D/c PCA and switch to PO meds if tolerates CLD. Discussed with RN. - Ambulate, PT. Currently rec for [facility] PT - Pulm toilet.  ABL anemia - s/p 1U PRBC 6/18 and 6/20. Getting 1U PRBC today. Follow hgb.    FEN - CLD, TPN, IVF per [facility] VTE - SCDs, anticoagulation on hold for abl anemia and thrombocytopenia ID - Cefotetan peri-op. Fever 6/20. CT A/P reassuring as above. On Cefepime/Flagyl/Vanc. Underdoing fever w/u per [facility] Foley - Out, voiding.  Plan - As above.    - Per [facility] -  Fever - appreciate [facility] assistance in w/u Hx Crohn's - Per GI. Per note, has plans to start biologic therapy in outpatient setting with primary GI team (through [facility]) Hx HTN Hx CVA w/ Chronic left hemiparesis   Hx PE/DVT - Per notes, dx in Feb this year. [facility] plans to tx when ABL anemia stable/improved.     LOS: 9 days    [name] , [facility] 02/02/2023, 11:47 AM Please see Amion for pager number during day hours 7:00am-4:30pm

## 2023-02-02 NOTE — Progress Notes (Signed)
PT Cancellation Note  Patient Details Name: Jemia Fata MRN: [id number] DOB: August 31, 1970   Cancelled Treatment:      will hold off today her Physical Therapy.  HgB 7.0 with late start transfusion due to complexity.  Will attempt to see another day.     Felecia Shelling  PTA Acute  Rehabilitation Services Office M-F          [phone]

## 2023-02-02 NOTE — Progress Notes (Signed)
PROGRESS NOTE  Tara Berger ZOX:[id number] DOB: 06-01-1971 DOA: 01/24/2023 PCP: Pcp, No   LOS: 9 days   Brief Narrative / Interim history: Tara Berger is a 52 y.o. 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 She returned to the emergency department 01/24/23 with complaints of abdominal distention, abdominal pain, nausea and multiple episodes of emesis over the last 2 weeks.  She was able to tolerate drinking fluids until yesterday.  She has been having some loose stools, but no BM in the last 2 days.  At time of admission, imaging revealed high-grade small bowel obstruction.  Patient was admitted to the hospital and general surgery was consulted.  She eventually underwent surgery on 6/17  Subjective / 24h Interval events: Feeling better, no further fever or chills.  Assesement and Plan: Principal Problem:   SBO (small bowel obstruction) (HCC) Active Problems:   Crohn's disease (HCC)   Essential hypertension   Left hemiparesis (HCC)   Pulmonary embolism on right (HCC)   Tobacco use disorder   Malnutrition of moderate degree   Principal problem Small bowel obstruction -General surgery consulted, has not improved with conservative management and she was taken to the OR on 6/17 status post ex lap, LOA, resection of old ileocolic anastomosis and formation of a new ileocolic anastomosis -Continue n.p.o., TPN, management per general surgery.  Possibly clears  today  Active problems SIRS -patient with high fever, tachycardia, chills 6/20.  This is roughly into halfway getting a blood transfusion.  Cannot fully rule out transfusion reaction versus sepsis physiology given recent intra-abdominal surgery.  Blood cultures were obtained at that point and she was started on antibiotics.  CT scan abdomen pelvis without acute findings.  CT angio without PE. -Continue to monitor cultures  Acute blood loss anemia, following surgery -status 2 units of packed red blood cells so far, with a second unit administered 6/20 she developed a potential transfusion reaction.  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  History of PE and DVT -she has not been adherent to anticoagulation as an outpatient.  She currently takes Eliquis PRN for "heavy legs" discussed with patient utmost importance to take anticoagulation as prescribed, every day, she is willing to continue Eliquis.  Hold anticoagulation now due to ongoing acute blood loss anemia  Hypomagnesemia - replace and continue to monitor  Tobacco use-continue nicotine patch  Chronic left hemiparesis following stroke December 2023 -encourage mobility, PT signed off.  Will anticoagulate once OK with surgery  Scheduled Meds:  sodium chloride   Intravenous Once   sodium chloride   Intravenous Once   Chlorhexidine Gluconate Cloth  6 each Topical Daily   HYDROmorphone   Intravenous Q4H   nicotine  14 mg Transdermal Daily   pantoprazole (PROTONIX) IV  40 mg Intravenous Daily   sodium chloride flush  10-40 mL Intracatheter Q12H   thiamine (VITAMIN B1) injection  100 mg Intravenous Daily   Continuous Infusions:  sodium chloride 40 mL/hr at  02/02/23 0601   ceFEPime (MAXIPIME) IV Stopped (02/02/23 1237)   metronidazole Stopped (02/02/23 1122)   TPN ADULT (ION) 80 mL/hr at 02/02/23 0601   TPN ADULT (ION)     PRN Meds:.acetaminophen, diphenhydrAMINE **OR** diphenhydrAMINE, ketorolac, levalbuterol, menthol-cetylpyridinium, naloxone **AND** sodium chloride flush, ondansetron (ZOFRAN) IV, mouth rinse, prochlorperazine, sodium chloride flush  Current Outpatient Medications  Medication Instructions   apixaban (ELIQUIS) 5 mg, Oral, 2 times daily   aspirin 81 mg, Oral, Daily   mirtazapine (REMERON) 15 mg, Oral, Daily at bedtime    Diet Orders (From admission, onward)     Start     Ordered   02/02/23 1200  Diet clear liquid Fluid consistency: Thin  Diet effective now       Question:  Fluid consistency:  Answer:  Thin   02/02/23 1159            DVT prophylaxis:    Lab Results  Component Value Date   PLT 103 (L) 02/02/2023      Code Status: Full Code  Family Communication: no family at bedside   Status is: Inpatient  Remains inpatient appropriate because: severity of illness  Level of care: Stepdown  Consultants:  General surgery  GI  Objective: Vitals:   02/02/23 1100 02/02/23 1200 02/02/23 1207 02/02/23 1211  BP: (!) 150/104 (!) 151/105    Pulse:  (!) 118    Resp: (!) 24 18  (!) 25  Temp:   98.1 F (36.7 C)   TempSrc:   Oral   SpO2:  100%  99%  Weight:      Height:        Intake/Output Summary (Last 24 hours) at 02/02/2023 1242 Last data filed at 02/02/2023 0981 Gross per 24 hour  Intake 2508.48 ml  Output 600 ml  Net 1908.48 ml    Wt Readings from Last 3 Encounters:  02/02/23 59.5 kg    Examination:  Constitutional: NAD Eyes: lids and conjunctivae normal, no scleral icterus ENMT: mmm Neck: normal, supple Respiratory: clear to auscultation bilaterally, no wheezing, no crackles.  Cardiovascular: Regular rate and rhythm, no murmurs / rubs / gallops. Abdomen: soft, no distention, no  tenderness. Bowel sounds positive.  Skin: no rashes  Data Reviewed: I have independently reviewed following labs and imaging studies   CBC Recent Labs  Lab 01/29/23 0958 01/30/23 0118 01/30/23 0339 01/31/23 0118 02/01/23 0353 02/01/23 1156 02/02/23 0414  WBC 5.5  --  8.7 10.1 10.3  --  8.6  HGB 10.2*   < > 7.2* 8.0* 6.8* 7.4* 7.0*  HCT 31.7*   < > 22.1* 24.2* 20.6* 22.3* 21.3*  PLT 130*  --  98* 95* 104*  --  103*  MCV 89.8  --  88.0 83.2 82.7  --  84.5  MCH 28.9  --  28.7 27.5 27.3  --  27.8  MCHC 32.2  --  32.6 33.1 33.0  --  32.9  RDW 15.2  --  15.1 17.2* 16.9*  --  16.8*   < > = values in this interval not displayed.     Recent Labs  Lab 01/29/23 0418 01/30/23 0339 01/31/23 0118 02/01/23 0353 02/01/23 1156 02/02/23 0414  NA 137 133* 133* 138  --  139  K 2.8* 3.8 3.8 4.3  --  5.2*  CL 106 107 105 106  --  105  CO2 26 22 25 29   --  27  GLUCOSE 121* 293* 133* 116*  --  173*  BUN 6 9 7 10   --  20  CREATININE 0.64 0.68 0.51 0.51  --  0.64  CALCIUM 6.8* 6.9* 7.5* 7.7*  --  7.6*  AST 36  --   --  30  --  40  ALT 25  --   --  27  --  36  ALKPHOS 56  --   --  62  --  83  BILITOT 0.6  --   --  0.7  --  0.8  ALBUMIN 1.7*  --   --  1.8*  --  1.8*  MG 1.0* 1.3* 1.9 1.7  --  2.6*  LATICACIDVEN  --   --   --   --  0.8  --   INR  --   --   --   --   --  1.1     ------------------------------------------------------------------------------------------------------------------ No results for input(s): "CHOL", "HDL", "LDLCALC", "TRIG", "CHOLHDL", "LDLDIRECT" in the last 72 hours.   No results found for: "HGBA1C" ------------------------------------------------------------------------------------------------------------------ No results for input(s): "TSH", "T4TOTAL", "T3FREE", "THYROIDAB" in the last 72 hours.  Invalid input(s): "FREET3"  Cardiac Enzymes No results for input(s): "CKMB", "TROPONINI", "MYOGLOBIN" in the last 168 hours.  Invalid input(s):  "CK" ------------------------------------------------------------------------------------------------------------------ No results found for: "BNP"  CBG: Recent Labs  Lab 02/01/23 2003 02/01/23 2350 02/02/23 0348 02/02/23 0814 02/02/23 1151  GLUCAP 178* 202* 164* 152* 155*     Recent Results (from the past 240 hour(s))  Blood culture (routine x 2)     Status: None   Collection Time: 01/24/23  2:52 PM   Specimen: BLOOD  Result Value Ref Range Status   Specimen Description   Final    BLOOD LEFT ANTECUBITAL Performed at [facility], [address] [place], [place] [address]    Special Requests   Final    BOTTLES DRAWN AEROBIC AND ANAEROBIC Blood Culture adequate volume Performed at [facility], [address] [place], [place] [address]    Culture   Final    NO GROWTH 5 DAYS Performed at [facility], [address] [place], [place] [address]    Report Status 01/29/2023 FINAL  Final  Culture, blood (Routine X 2) w Reflex to ID Panel     Status: None   Collection Time: 01/24/23  9:12 PM   Specimen: BLOOD LEFT HAND  Result Value Ref Range Status   Specimen Description   Final    BLOOD LEFT HAND Performed at [facility], [address], [place], [place] [address]    Special Requests   Final    BOTTLES DRAWN AEROBIC AND ANAEROBIC BOTTLES DRAWN AEROBIC ONLY Performed at [facility], [address] [place], [place] [address]    Culture   Final    NO GROWTH 5 DAYS Performed at [facility], [address], [place], [place] [address]    Report Status 01/29/2023 FINAL  Final  Urine Culture     Status: Abnormal   Collection Time: 01/31/23  2:22 PM   Specimen: Urine, Clean Catch  Result Value Ref Range Status   Specimen Description   Final    URINE, CLEAN CATCH Performed at [facility], [address], [place], [place] [address]    Special Requests   Final     NONE Performed at [facility], [address] [place], [place] [address]    Culture MULTIPLE SPECIES PRESENT, SUGGEST RECOLLECTION (A)  Final   Report Status 02/01/2023 FINAL  Final  Culture, blood (Routine X 2) w Reflex to ID Panel     Status: None (Preliminary result)   Collection Time: 02/01/23  9:51 AM   Specimen: BLOOD RIGHT HAND  Result Value Ref Range Status   Specimen Description   Final    BLOOD RIGHT HAND Performed at [facility], [address] [place], [place] [address]    Special Requests   Final    BOTTLES DRAWN AEROBIC AND ANAEROBIC Blood Culture adequate volume Performed at [facility], [address] [place], [place] [address]    Culture   Final    NO GROWTH < 24 HOURS Performed at [facility], [address], [place], [place] [address]    Report Status PENDING  Incomplete  Culture, blood (Routine X 2) w Reflex to ID Panel     Status: None (Preliminary result)   Collection Time: 02/01/23  9:52 AM   Specimen: Right Antecubital; Blood  Result Value Ref Range Status   Specimen Description   Final    RIGHT ANTECUBITAL BLOOD Performed at [facility], [address] [place], [place] [address]    Special Requests   Final    BOTTLES DRAWN AEROBIC AND ANAEROBIC Blood Culture adequate volume Performed at [facility], [address] [place], [place] [address]    Culture   Final    NO GROWTH < 24 HOURS Performed at [facility], [address] [place], [place] [address]    Report Status PENDING  Incomplete  MRSA Next Gen by PCR, Nasal     Status: None   Collection Time: 02/01/23  9:52 AM   Specimen: Nasal Mucosa; Nasal Swab  Result Value Ref Range Status   MRSA by PCR Next Gen NOT DETECTED NOT DETECTED Final    Comment: (NOTE) The GeneXpert MRSA Assay (FDA approved for NASAL specimens only), is one component of a comprehensive MRSA colonization  surveillance program. It is not intended to diagnose MRSA infection nor to guide or monitor treatment for MRSA infections. Test performance is not FDA approved in patients less than 42 years old. Performed at [facility], [address] [place], [place] [address]      Radiology Studies: No results found.   Pamella Pert, MD, PhD [facility]  Between 7 am - 7 pm I am available, please contact me via Amion (for emergencies) or Securechat (non urgent messages)  Between 7 pm - 7 am I am not available, please contact night coverage MD/APP via Amion

## 2023-02-02 NOTE — Progress Notes (Signed)
Nutrition Follow-up  DOCUMENTATION CODES:   Non-severe (moderate) malnutrition in context of chronic illness  INTERVENTION:  - Continue TPN at goal of 33mL/hr: provides 105g protein, 288g dextrose, 576 IL kcals = 1977 kcal/day  - TPN management per Pharmacy  - Monitor magnesium, potassium, and phosphorus for at least 3 days, MD to replete as needed, as pt is at risk for refeeding syndrome.   - Continue 100 mg Thiamine daily through end of x5 day course (ends tomorrow).  - Daily weights while on TPN  - Clear Liquid diet per Surgery today.  - Will monitor for further diet advancement.   NUTRITION DIAGNOSIS:   Moderate Malnutrition related to chronic illness, altered GI function (Crohn's disease with multiple SBOs and GI surgeries) as evidenced by mild fat depletion, moderate muscle depletion, percent weight loss. *ongoing  GOAL:   Patient will meet greater than or equal to 90% of their needs *met with TPN  MONITOR:   Labs, Weight trends, I & O's (TPN)  REASON FOR ASSESSMENT:   Consult New TPN/TNA  ASSESSMENT:   52 y.o. 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 She returned to the emergency department 01/24/23 with complaints of abdominal distention, abdominal pain, nausea and multiple episodes of emesis over the last 2 weeks.Admitted for high grade SBO.  6/12: admitted 6/16: TPN initiated 6/17: s/p ex lap with lysis of adhesion, resection of  ileocolic anastomosis, new ileocolic anastomosis  6/19 increased to goal TPN of 33mL/hr  Patient resting in bed at time of visit. Reports feeling much better today compared to yesterday.   Discussed that TPN is now at goal, so she is receiving 100% of estimated energy requirements. Patient endorsed understanding. Inquiring what she will be able to eat after discharge. Discussed that this will depend on her progression over the next few days.   Patient's diet advanced to clear liquids by surgery after visit.   Per chart review it is noted patient was started on TPN at [facility] due to risk of short gut syndrome. She has now had further bowel resections so suspect patient may now be at very high risk for short gut. Will follow progression and Surgery recommendations but suspect patient may need more long term TPN.    Medications reviewed and include: 100mg  thiamine (6/17-6/22)  Labs reviewed:  K+ 5.2 Magnesium 2.6 Triglycerides 71   Diet Order:   Diet Order             Diet clear liquid Fluid consistency: Thin  Diet effective now                   EDUCATION NEEDS:  Not appropriate for education at this time  Skin:  Skin Assessment: Skin Integrity Issues: Skin Integrity Issues:: Incisions Incisions: Abdomen  Last BM:  6/19  Height:  Ht Readings from Last 1 Encounters:  02/01/23 5\' 2"  (1.575 m)   Weight:  Wt Readings from Last 1 Encounters:  02/02/23 59.5 kg    BMI:  Body mass index is 23.99 kg/m.  Estimated Nutritional Needs:  Kcal:  1800-2000 Protein:  90-105g Fluid:  2L/day    Shelle Iron RD, LDN For contact information, refer to [facility].

## 2023-02-02 NOTE — Progress Notes (Signed)
PHARMACY - TOTAL PARENTERAL NUTRITION CONSULT NOTE   Indication: Small bowel obstruction  Patient Measurements: Height: 5\' 2"  (157.5 cm) Weight: 59.5 kg (131 lb 2.8 oz) IBW/kg (Calculated) : 50.1 TPN AdjBW (KG): 56.5 Body mass index is 23.99 kg/m.  Assessment:  Pharmacy is consulted to start TPN on 52 yo female admitted with abdominal pain, distention, and nausea with episodes of emesis over the past 2 weeks. Extensive history of small bowel stricturing and prior surgeries d/t Crohn's disease. CT shows distal SBO. Recent admission in March 2024 for SBO with NGT & TPN, plans for surgery, but pt left AMA. Now with tentative plans for surgery on 6/17.   Glucose / Insulin: no hx of DM  - CBGs well controlled apart from brief spike after postop Decadron.  SSI discontinued yesterday. Electrolytes: Potassium and magnesium (rec'd additional magnesium supplementation yesterday) elevated; all others WNL.  Phos has trended up to 4.3 at higher end of normal limits. Renal: SCr slightly up to 0.64, BUN up to 20; UOP down Hepatic: LFTs, Tbili stable WNL; albumin low and stable - TG WNL (6/17) I/O:  - Urine Output: reduced at 800 - I/O Net: 4331.9 - LBM: 6/19 - MIVF: NS @ 40 ml/hr  GI Imaging: - 6/13 CTa/p: high-grade distal small-bowel obstruction d/t stricture vs adhesions; no active Crohn's dz noted - 6/15 AXR: persistent SBO GI Surgeries / Procedures:  - 6/17: Ex lap with lysis of adhesions, resection of prior ileocolic anastomosis (which had become strictured), new ileocolic anastomosis. 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  Central access: PICC TPN start date: 6/16   Nutritional Goals: Current TPN formulation (55 g AA, 15% CHO, 30 g/L lipid) at 80 ml/hr provides 105 g protein, 1974 kcal, 1920 mL daily - Keep K >/= 4.0; Mg >/= 2.0 for ileus  RD Assessment: Estimated Needs Total Energy Estimated Needs:  1800-2000 Total Protein Estimated Needs: 90-105g Total Fluid Estimated Needs: 2L/day  Current Nutrition:  NPO, TPN  Plan:  Continue TPN at goal 80 mL/hr Electrolytes in TPN: decreased Ac Na 100 mEq/L  K 0 mEq/L - removed Ca 5 mEq/L Mg 0 mEq/L - removed Phos 10 mmol/L - reduced Cl:Ac = 1:2 Add standard MVI and trace elements to TPN Receiving thiamine 100 mg IV daily x 5 outside TPN, given refeeding risk MIVF - NS 40 ml/hr; further changes per MD SSI discontinued given well controlled CBGs and TPN stable at goal Monitor TPN labs on Mon/Thurs Bmet, Mag, Phos tomorrow  Thank you for allowing pharmacy to be a part of this patient's care.  Selinda Eon, PharmD, BCPS Clinical Pharmacist [facility] 02/02/2023 10:38 AM

## 2023-02-03 ENCOUNTER — Encounter (HOSPITAL_COMMUNITY): Payer: Self-pay | Admitting: Internal Medicine

## 2023-02-03 DIAGNOSIS — K56609 Unspecified intestinal obstruction, unspecified as to partial versus complete obstruction: Secondary | ICD-10-CM | POA: Diagnosis not present

## 2023-02-03 LAB — GLUCOSE, CAPILLARY
Glucose-Capillary: 158 mg/dL — ABNORMAL HIGH (ref 70–99)
Glucose-Capillary: 133 mg/dL — ABNORMAL HIGH (ref 70–99)
Glucose-Capillary: 133 mg/dL — ABNORMAL HIGH (ref 70–99)
Glucose-Capillary: 124 mg/dL — ABNORMAL HIGH (ref 70–99)

## 2023-02-03 LAB — BASIC METABOLIC PANEL
Anion gap: 6 (ref 5–15)
BUN: 26 mg/dL — ABNORMAL HIGH (ref 6–20)
CO2: 24 mmol/L (ref 22–32)
Calcium: 7.8 mg/dL — ABNORMAL LOW (ref 8.9–10.3)
Chloride: 104 mmol/L (ref 98–111)
Creatinine, Ser: 0.52 mg/dL (ref 0.44–1.00)
GFR, Estimated: >60 mL/min (ref >60)
Glucose, Bld: 165 mg/dL — ABNORMAL HIGH (ref 70–99)
Potassium: 4.8 mmol/L (ref 3.5–5.1)
Sodium: 134 mmol/L — ABNORMAL LOW (ref 135–145)

## 2023-02-03 LAB — BPAM RBC
Blood Product Expiration Date: 202406252359
Blood Product Expiration Date: 202406262359
ISSUE DATE / TIME: 202406200543
ISSUE DATE / TIME: 202406211310
Unit Type and Rh: 1700
Unit Type and Rh: 1700

## 2023-02-03 LAB — TYPE AND SCREEN
ABO/RH(D): B POS
Antibody Screen: NEGATIVE
Unit division: 0

## 2023-02-03 LAB — MAGNESIUM: Magnesium: 2 mg/dL (ref 1.7–2.4)

## 2023-02-03 LAB — PHOSPHORUS: Phosphorus: 3.8 mg/dL (ref 2.5–4.6)

## 2023-02-03 LAB — CULTURE, BLOOD (ROUTINE X 2)

## 2023-02-03 MED ORDER — ONDANSETRON HCL 4 MG/2ML IJ SOLN
4.0000 mg | Freq: Four times a day (QID) | INTRAMUSCULAR | Status: DC | PRN
  Administered 2023-02-03: 4 mg via INTRAVENOUS
  Filled 2023-02-03: qty 2

## 2023-02-03 MED ORDER — OXYCODONE HCL 5 MG PO TABS
5.0000 mg | ORAL_TABLET | ORAL | Status: DC | PRN
  Administered 2023-02-03 – 2023-02-04 (×3): 10 mg via ORAL
  Administered 2023-02-04: 5 mg via ORAL
  Filled 2023-02-03: qty 2
  Filled 2023-02-03: qty 1
  Filled 2023-02-03 (×2): qty 2

## 2023-02-03 MED ORDER — DIPHENHYDRAMINE HCL 50 MG/ML IJ SOLN: 12.5000 mg | Freq: Four times a day (QID) | INTRAMUSCULAR | Status: DC | PRN

## 2023-02-03 MED ORDER — SODIUM CHLORIDE 0.9% FLUSH: 9.0000 mL | INTRAVENOUS | Status: DC | PRN

## 2023-02-03 MED ORDER — TRAVASOL 10 % IV SOLN
INTRAVENOUS | Status: AC
  Filled 2023-02-03: qty 1056

## 2023-02-03 MED ORDER — DIPHENHYDRAMINE HCL 12.5 MG/5ML PO ELIX: 12.5000 mg | ORAL_SOLUTION | Freq: Four times a day (QID) | ORAL | Status: DC | PRN

## 2023-02-03 MED ORDER — HYDROMORPHONE 1 MG/ML IV SOLN
INTRAVENOUS | Status: DC
  Administered 2023-02-03: 1.6 mg via INTRAVENOUS
  Administered 2023-02-03: 1.2 mg via INTRAVENOUS
  Administered 2023-02-04: 2.4 mg via INTRAVENOUS

## 2023-02-03 MED ORDER — NALOXONE HCL 0.4 MG/ML IJ SOLN: 0.4000 mg | INTRAMUSCULAR | Status: DC | PRN

## 2023-02-03 MED ORDER — METOPROLOL TARTRATE 5 MG/5ML IV SOLN
5.0000 mg | Freq: Four times a day (QID) | INTRAVENOUS | Status: DC | PRN
  Administered 2023-02-04: 5 mg via INTRAVENOUS
  Filled 2023-02-03: qty 5

## 2023-02-03 MED ORDER — INSULIN ASPART 100 UNIT/ML IJ SOLN
0.0000 [IU] | Freq: Four times a day (QID) | INTRAMUSCULAR | Status: DC
  Administered 2023-02-03 – 2023-02-04 (×3): 1 [IU] via SUBCUTANEOUS

## 2023-02-03 NOTE — Progress Notes (Signed)
5 Days Post-Op   Subjective/Chief Complaint: Patient reports no bm or flatus over night Denies nausea or bloating   Objective: Vital signs in last 24 hours: Temp:  [98.1 F (36.7 C)-98.4 F (36.9 C)] 98.2 F (36.8 C) (06/22 0425) Pulse Rate:  [115-125] 120 (06/21 2000) Resp:  [15-26] 18 (06/22 0436) BP: (125-169)/(87-122) 140/106 (06/22 0400) SpO2:  [63 %-100 %] 98 % (06/22 0436) FiO2 (%):  [26 %] 26 % (06/22 0436) Last BM Date : (P) 01/31/23  Intake/Output from previous day: 06/21 0701 - 06/22 0700 In: 1450.6 [I.V.:955.2; Blood:306.7; IV Piggyback:188.8] Out: 404 [Urine:404] Intake/Output this shift: No intake/output data recorded.  Exam: Awake and alert Comfortable Abdomen soft, incision healing well, minimally tender  Lab Results:  Recent Labs    02/01/23 0353 02/01/23 1156 02/02/23 0414 02/02/23 2103  WBC 10.3  --  8.6  --   HGB 6.8*   < > 7.0* 8.8*  HCT 20.6*   < > 21.3* 26.8*  PLT 104*  --  103*  --    < > = values in this interval not displayed.   BMET Recent Labs    02/02/23 0414 02/03/23 0553  NA 139 134*  K 5.2* 4.8  CL 105 104  CO2 27 24  GLUCOSE 173* 165*  BUN 20 26*  CREATININE 0.64 0.52  CALCIUM 7.6* 7.8*   PT/INR Recent Labs    02/02/23 0414  LABPROT 14.1  INR 1.1   ABG No results for input(s): "PHART", "HCO3" in the last 72 hours.  Invalid input(s): "PCO2", "PO2"  Studies/Results: CT Angio Chest Pulmonary Embolism (PE) W or WO Contrast  Result Date: 02/01/2023 CLINICAL DATA:  Recent surgery for small bowel obstruction. Postoperative abdominal pain. EXAM: CT ANGIOGRAPHY CHEST CT ABDOMEN AND PELVIS WITH CONTRAST TECHNIQUE: Multidetector CT imaging of the chest was performed using the standard protocol during bolus administration of intravenous contrast. Multiplanar CT image reconstructions and MIPs were obtained to evaluate the vascular anatomy. Multidetector CT imaging of the abdomen and pelvis was performed using the standard  protocol during bolus administration of intravenous contrast. RADIATION DOSE REDUCTION: This exam was performed according to the departmental dose-optimization program which includes automated exposure control, adjustment of the mA and/or kV according to patient size and/or use of iterative reconstruction technique. CONTRAST:  OMNIPAQUE IOHEXOL 300 MG/ML  SOLN COMPARISON:  CT abdomen/pelvis 01/25/2023 FINDINGS: CTA CHEST FINDINGS Cardiovascular: The heart is normal in size. No pericardial effusion. The aorta is normal in caliber. No dissection. Scattered atherosclerotic calcifications at the aortic arch. Age advanced three-vessel coronary artery calcifications are noted. The pulmonary arterial tree is well opacified. No filling defects to suggest pulmonary embolism. Mild enlargement of the pulmonary arteries may suggest pulmonary hypertension. Mediastinum/Nodes: No mediastinal or hilar mass or lymphadenopathy. The esophagus is grossly. The left subclavian central venous catheter is in good position. Lungs/Pleura: Small bilateral pleural effusions, right larger than left with areas of overlying subsegmental atelectasis. Underlying emphysematous changes and pulmonary scarring. Centrilobular upper lobe nodularity greater on the right likely suggesting respiratory bronchiolitis. Musculoskeletal: No significant bony findings. Review of the MIP images confirms the above findings. CT ABDOMEN and PELVIS FINDINGS Hepatobiliary: Mild periportal edema and a small amount fluid around the gallbladder likely a postsurgical change. No hepatic lesions or intrahepatic biliary dilatation. The portal and hepatic veins are patent. Pancreas: Normal Spleen: Normal Adrenals/Urinary Tract: Normal Stomach/Bowel: The stomach and duodenum are unremarkable. The proximal and mid small bowel loops are normal in caliber. Slight dilatation  of the more distal small bowel loops but no findings for recurrent obstruction. Contrast extends all  the way into the colon. Surgical changes at the ileocolonic anastomosis. Expected inflammations/edema and scattered surrounding fluid but no findings suspicious for a postoperative abscess. No leaking oral contrast is identified. One small dot of air near the anastomosis is not unexpected. Vascular/Lymphatic: The aorta and branch vessels are patent. The major venous structures are patent. Stable IVC filter. No mesenteric or retroperitoneal mass or adenopathy. Reproductive: The uterus and ovaries are unremarkable. Other: Small amount of free abdominal and free pelvic fluid, not unexpected after surgery. No hematoma or rim enhancing abscess. Musculoskeletal: No significant bony findings. Review of the MIP images confirms the above findings. IMPRESSION: 1. No CT findings for pulmonary embolism. 2. Small bilateral pleural effusions, right larger than left with areas of overlying subsegmental atelectasis. 3. Underlying emphysematous changes and pulmonary scarring. 4. Centrilobular upper lobe nodularity greater on the right likely suggesting respiratory bronchiolitis. 5. Surgical changes at the ileocolonic anastomosis. Expected inflammation/edema and scattered surrounding fluid but no findings suspicious for a postoperative abscess. No leaking oral contrast is identified. 6. Small amount of free abdominal and free pelvic fluid, not unexpected after surgery. 7. Aortic atherosclerosis. Aortic Atherosclerosis (ICD10-I70.0) and Emphysema (ICD10-J43.9). But Electronically Signed   By: Rudie Meyer M.D.   On: 02/01/2023 12:09   CT ABDOMEN PELVIS W CONTRAST  Result Date: 02/01/2023 CLINICAL DATA:  Recent surgery for small bowel obstruction. Postoperative abdominal pain. EXAM: CT ANGIOGRAPHY CHEST CT ABDOMEN AND PELVIS WITH CONTRAST TECHNIQUE: Multidetector CT imaging of the chest was performed using the standard protocol during bolus administration of intravenous contrast. Multiplanar CT image reconstructions and MIPs were  obtained to evaluate the vascular anatomy. Multidetector CT imaging of the abdomen and pelvis was performed using the standard protocol during bolus administration of intravenous contrast. RADIATION DOSE REDUCTION: This exam was performed according to the departmental dose-optimization program which includes automated exposure control, adjustment of the mA and/or kV according to patient size and/or use of iterative reconstruction technique. CONTRAST:  OMNIPAQUE IOHEXOL 300 MG/ML  SOLN COMPARISON:  CT abdomen/pelvis 01/25/2023 FINDINGS: CTA CHEST FINDINGS Cardiovascular: The heart is normal in size. No pericardial effusion. The aorta is normal in caliber. No dissection. Scattered atherosclerotic calcifications at the aortic arch. Age advanced three-vessel coronary artery calcifications are noted. The pulmonary arterial tree is well opacified. No filling defects to suggest pulmonary embolism. Mild enlargement of the pulmonary arteries may suggest pulmonary hypertension. Mediastinum/Nodes: No mediastinal or hilar mass or lymphadenopathy. The esophagus is grossly. The left subclavian central venous catheter is in good position. Lungs/Pleura: Small bilateral pleural effusions, right larger than left with areas of overlying subsegmental atelectasis. Underlying emphysematous changes and pulmonary scarring. Centrilobular upper lobe nodularity greater on the right likely suggesting respiratory bronchiolitis. Musculoskeletal: No significant bony findings. Review of the MIP images confirms the above findings. CT ABDOMEN and PELVIS FINDINGS Hepatobiliary: Mild periportal edema and a small amount fluid around the gallbladder likely a postsurgical change. No hepatic lesions or intrahepatic biliary dilatation. The portal and hepatic veins are patent. Pancreas: Normal Spleen: Normal Adrenals/Urinary Tract: Normal Stomach/Bowel: The stomach and duodenum are unremarkable. The proximal and mid small bowel loops are normal in  caliber. Slight dilatation of the more distal small bowel loops but no findings for recurrent obstruction. Contrast extends all the way into the colon. Surgical changes at the ileocolonic anastomosis. Expected inflammations/edema and scattered surrounding fluid but no findings suspicious for a postoperative abscess.  No leaking oral contrast is identified. One small dot of air near the anastomosis is not unexpected. Vascular/Lymphatic: The aorta and branch vessels are patent. The major venous structures are patent. Stable IVC filter. No mesenteric or retroperitoneal mass or adenopathy. Reproductive: The uterus and ovaries are unremarkable. Other: Small amount of free abdominal and free pelvic fluid, not unexpected after surgery. No hematoma or rim enhancing abscess. Musculoskeletal: No significant bony findings. Review of the MIP images confirms the above findings. IMPRESSION: 1. No CT findings for pulmonary embolism. 2. Small bilateral pleural effusions, right larger than left with areas of overlying subsegmental atelectasis. 3. Underlying emphysematous changes and pulmonary scarring. 4. Centrilobular upper lobe nodularity greater on the right likely suggesting respiratory bronchiolitis. 5. Surgical changes at the ileocolonic anastomosis. Expected inflammation/edema and scattered surrounding fluid but no findings suspicious for a postoperative abscess. No leaking oral contrast is identified. 6. Small amount of free abdominal and free pelvic fluid, not unexpected after surgery. 7. Aortic atherosclerosis. Aortic Atherosclerosis (ICD10-I70.0) and Emphysema (ICD10-J43.9). But Electronically Signed   By: Rudie Meyer M.D.   On: 02/01/2023 12:09   DG CHEST PORT 1 VIEW  Result Date: 02/01/2023 CLINICAL DATA:  Wheezing. EXAM: PORTABLE CHEST 1 VIEW COMPARISON:  January 31, 2023. FINDINGS: Stable cardiomediastinal silhouette. Left-sided PICC line is noted with tip in expected position of cavoatrial junction. Minimal right  basilar subsegmental atelectasis is noted. Stable left midlung scarring or atelectasis is noted with associated pleural thickening. Stable left basilar atelectasis or scarring is noted with small left pleural effusion or scarring. Bony thorax is unremarkable. IMPRESSION: Stable findings seen involving left lung. Minimal right basilar subsegmental atelectasis is noted. Electronically Signed   By: Lupita Raider M.D.   On: 02/01/2023 11:46    Anti-infectives: Anti-infectives (From admission, onward)    Start     Dose/Rate Route Frequency Ordered Stop   02/02/23 1000  vancomycin (VANCOREADY) IVPB 1250 mg/250 mL  Status:  Discontinued        1,250 mg 166.7 mL/hr over 90 Minutes Intravenous Every 24 hours 02/01/23 1246 02/02/23 1216   02/01/23 1200  ceFEPIme (MAXIPIME) 2 g in sodium chloride 0.9 % 100 mL IVPB        2 g 200 mL/hr over 30 Minutes Intravenous Every 8 hours 02/01/23 1106     02/01/23 1100  vancomycin (VANCOREADY) IVPB 1250 mg/250 mL        1,250 mg 166.7 mL/hr over 90 Minutes Intravenous NOW 02/01/23 1001 02/01/23 1148   02/01/23 1015  metroNIDAZOLE (FLAGYL) IVPB 500 mg        500 mg 100 mL/hr over 60 Minutes Intravenous 2 times daily 02/01/23 1009     02/01/23 0830  Ampicillin-Sulbactam (UNASYN) 3 g in sodium chloride 0.9 % 100 mL IVPB  Status:  Discontinued        3 g 200 mL/hr over 30 Minutes Intravenous Every 6 hours 02/01/23 0744 02/01/23 1010   01/29/23 0800  cefoTEtan (CEFOTAN) 2 g in sodium chloride 0.9 % 100 mL IVPB        2 g 200 mL/hr over 30 Minutes Intravenous On call to O.R. 01/28/23 0815 01/29/23 1301       Assessment/Plan: POD 5 s/p ex lap, loa, resection of ileocolic anastomosis with new ileocolic anastomosis for sbo 2/2 strictured ileocolic anastomosis by Dr. Magnus Ivan 01/29/23  - Path benign - CT 6/20 reassuring with no evidence of abscess or leak.  -Hgb stable  -possible transfusion reaction  Keep on clear  liquids until further GI function    Abigail Miyamoto MD 02/03/2023

## 2023-02-03 NOTE — Progress Notes (Signed)
PHARMACY - TOTAL PARENTERAL NUTRITION CONSULT NOTE   Indication: Small bowel obstruction  Patient Measurements: Height: 5\' 2"  (157.5 cm) Weight: 59.5 kg (131 lb 2.8 oz) IBW/kg (Calculated) : 50.1 TPN AdjBW (KG): 56.5 Body mass index is 23.99 kg/m.  Assessment:  Pharmacy is consulted to start TPN on 52 yo female admitted with abdominal pain, distention, and nausea with episodes of emesis over the past 2 weeks. Extensive history of small bowel stricturing and prior surgeries d/t Crohn's disease. CT shows distal SBO. Recent admission in March 2024 for SBO with NGT & TPN, plans for surgery, but pt left AMA. Returned 6/12 then underwent surgery on 6/17.   Glucose / Insulin: no hx of DM  - CBGs well controlled apart from brief spike after postop Decadron.  SSI discontinued 6/20.  However, glucoses from yesterday are above goal (100-150) ranging from 155-189 Electrolytes: Na low at 134, all others WNL including K and Mg which has improved to WNL after removing from TPN yesterday Renal: SCr down to 0.52, BUN up to 26; UOP down Hepatic: LFTs, Tbili stable WNL; albumin low and stable - TG WNL (6/17) I/O:  - Urine Output: reduced at 400 and 4 x unmeasured - I/O Net: 1046.6 - LBM: 6/19 - MIVF: NS @ 40 ml/hr  GI Imaging: - 6/13 CTa/p: high-grade distal small-bowel obstruction d/t stricture vs adhesions; no active Crohn's dz noted - 6/15 AXR: persistent SBO GI Surgeries / Procedures:  - 6/17: Ex lap with lysis of adhesions, resection of prior ileocolic anastomosis (which had become strictured), new ileocolic anastomosis. 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  Central access: PICC TPN start date: 6/16   Nutritional Goals: Current TPN formulation (55 g AA, 15% CHO, 30 g/L lipid) at 80 ml/hr provides 105 g protein, 1974 kcal, 1920 mL daily - Keep K >/= 4.0; Mg >/= 2.0 for ileus  RD Assessment: Estimated Needs Total Energy  Estimated Needs: 1800-2000 Total Protein Estimated Needs: 90-105g Total Fluid Estimated Needs: 2L/day  Current Nutrition:  NPO, TPN  Plan:  Continue TPN at goal 80 mL/hr Electrolytes in TPN: decreased Ac Na 110 mEq/L - increased K 0 mEq/L Ca 5 mEq/L Mg 0 mEq/L Phos 10 mmol/L Cl:Ac = 1:2 Add standard MVI and trace elements to TPN Receiving thiamine 100 mg IV daily x 5 outside TPN, given refeeding risk MIVF - NS 40 ml/hr; further changes per MD Restart sensitive SSI q6h Monitor TPN labs on Mon/Thurs Bmet, Mag, Phos tomorrow  Thank you for allowing pharmacy to be a part of this patient's care.  Selinda Eon, PharmD, BCPS Clinical Pharmacist [facility] 02/03/2023 7:22 AM

## 2023-02-03 NOTE — Progress Notes (Signed)
PROGRESS NOTE  Tara Berger ZOX:[id number] DOB: 17-Jan-1971 DOA: 01/24/2023 PCP: Pcp, No   LOS: 10 days   Brief Narrative / Interim history: Tara Berger is a 52 y.o. 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 She returned to the emergency department 01/24/23 with complaints of abdominal distention, abdominal pain, nausea and multiple episodes of emesis over the last 2 weeks.  She was able to tolerate drinking fluids until yesterday.  She has been having some loose stools, but no BM in the last 2 days.  At time of admission, imaging revealed high-grade small bowel obstruction.  Patient was admitted to the hospital and general surgery was consulted.  She eventually underwent surgery on 6/17  Subjective / 24h Interval events: Feeling better today than yesterday.  Received blood transfusion last night without significant side effects  Assesement and Plan: Principal Problem:   SBO (small bowel obstruction) (HCC) Active Problems:   Crohn's disease (HCC)   Essential hypertension   Left hemiparesis (HCC)   Pulmonary embolism on right (HCC)   Tobacco use disorder   Malnutrition of moderate degree   Principal problem Small bowel obstruction -General surgery consulted, has not improved with conservative management and she was taken to the OR on 6/17 status post ex lap, LOA, resection of old ileocolic anastomosis and formation of a new ileocolic anastomosis -Continue  clear liquids, TPN, management per general surgery.  Has not had a BM in 2 days  Active problems SIRS -patient with high fever, tachycardia, chills 6/20.  This is roughly into halfway getting a blood transfusion.  Cannot fully rule out transfusion reaction versus sepsis physiology given recent intra-abdominal surgery.  Blood cultures were obtained at that point and she was started on antibiotics.  CT scan abdomen pelvis without acute findings.  CT angio without PE. -Continue to monitor cultures, considering discontinuation of antibiotics tomorrow if she remains afebrile and cultures are without growth  Acute blood loss anemia, following surgery -status 2 units of packed red blood cells so far, with a second unit administered 6/20 she developed a potential transfusion reaction.  Hemoglobin still drifting down, was transfused 1/3 unit on 6/21 at a lower rate, premedicated and patient had no side effects  Crohn's disease -GI saw patient on 6/15, did not think steroids will be beneficial as this is likely a fibrotic stenosis rather than inflammatory, she has plans to start biologic therapy with outpatient GI through [facility].  History of PE and DVT -she has not been adherent to anticoagulation as an outpatient.  She currently takes Eliquis PRN for "heavy legs" discussed with patient utmost importance to take anticoagulation as prescribed, every day, she is willing to continue Eliquis.  Hold anticoagulation now due to ongoing acute blood loss anemia  Hypomagnesemia - replace and continue to monitor  Tobacco use-continue nicotine patch  Chronic left hemiparesis following stroke December 2023 -encourage mobility, PT signed off.  Will anticoagulate once OK with surgery  Scheduled Meds:  sodium chloride   Intravenous Once   Chlorhexidine Gluconate Cloth  6 each Topical Daily   HYDROmorphone  Intravenous Q4H   insulin aspart  0-9 Units Subcutaneous Q6H   nicotine  14 mg Transdermal Daily    pantoprazole (PROTONIX) IV  40 mg Intravenous Daily   sodium chloride flush  10-40 mL Intracatheter Q12H   thiamine (VITAMIN B1) injection  100 mg Intravenous Daily   Continuous Infusions:  sodium chloride 40 mL/hr at 02/03/23 0951   ceFEPime (MAXIPIME) IV Stopped (02/03/23 0529)   metronidazole 500 mg (02/03/23 1002)   TPN ADULT (ION) 80 mL/hr at 02/02/23 1901   TPN ADULT (ION)     PRN Meds:.acetaminophen, diphenhydrAMINE **OR** diphenhydrAMINE, ketorolac, levalbuterol, menthol-cetylpyridinium, naloxone **AND** sodium chloride flush, ondansetron (ZOFRAN) IV, mouth rinse, oxyCODONE, prochlorperazine, sodium chloride flush  Current Outpatient Medications  Medication Instructions   apixaban (ELIQUIS) 5 mg, Oral, 2 times daily   aspirin 81 mg, Oral, Daily   mirtazapine (REMERON) 15 mg, Oral, Daily at bedtime    Diet Orders (From admission, onward)     Start     Ordered   02/02/23 1200  Diet clear liquid Fluid consistency: Thin  Diet effective now       Question:  Fluid consistency:  Answer:  Thin   02/02/23 1159            DVT prophylaxis:    Lab Results  Component Value Date   PLT 103 (L) 02/02/2023      Code Status: Full Code  Family Communication: no family at bedside   Status is: Inpatient  Remains inpatient appropriate because: severity of illness  Level of care: Stepdown  Consultants:  General surgery  GI  Objective: Vitals:   02/03/23 0800 02/03/23 0905 02/03/23 0944 02/03/23 0955  BP:      Pulse:      Resp: 20  19 19   Temp:  98.1 F (36.7 C)    TempSrc:      SpO2: 100%   100%  Weight:      Height:        Intake/Output Summary (Last 24 hours) at 02/03/2023 1124 Last data filed at 02/03/2023 1029 Gross per 24 hour  Intake 1450.62 ml  Output 404 ml  Net 1046.62 ml    Wt Readings from Last 3 Encounters:  02/02/23 59.5 kg    Examination:  Constitutional: NAD Eyes: lids and conjunctivae normal, no scleral icterus ENMT: mmm Neck:  normal, supple Respiratory: clear to auscultation bilaterally, no wheezing, no crackles. Normal respiratory effort.  Cardiovascular: Regular rate and rhythm, no murmurs / rubs / gallops. No LE edema. Abdomen: soft, no distention, no tenderness. Bowel sounds positive.   Data Reviewed: I have independently reviewed following labs and imaging studies   CBC Recent Labs  Lab 01/29/23 0958 01/30/23 0118 01/30/23 0339 01/31/23 0118 02/01/23 0353 02/01/23 1156 02/02/23 0414 02/02/23 2103  WBC 5.5  --  8.7 10.1 10.3  --  8.6  --   HGB 10.2*   < > 7.2* 8.0* 6.8* 7.4* 7.0* 8.8*  HCT 31.7*   < > 22.1* 24.2* 20.6* 22.3* 21.3* 26.8*  PLT 130*  --  98* 95* 104*  --  103*  --   MCV 89.8  --  88.0 83.2 82.7  --  84.5  --   MCH 28.9  --  28.7 27.5 27.3  --  27.8  --   MCHC 32.2  --  32.6 33.1 33.0  --  32.9  --   RDW 15.2  --  15.1 17.2* 16.9*  --  16.8*  --    < > =  values in this interval not displayed.     Recent Labs  Lab 01/29/23 0418 01/30/23 0339 01/31/23 0118 02/01/23 0353 02/01/23 1156 02/02/23 0414 02/03/23 0553  NA 137 133* 133* 138  --  139 134*  K 2.8* 3.8 3.8 4.3  --  5.2* 4.8  CL 106 107 105 106  --  105 104  CO2 26 22 25 29   --  27 24  GLUCOSE 121* 293* 133* 116*  --  173* 165*  BUN 6 9 7 10   --  20 26*  CREATININE 0.64 0.68 0.51 0.51  --  0.64 0.52  CALCIUM 6.8* 6.9* 7.5* 7.7*  --  7.6* 7.8*  AST 36  --   --  30  --  40  --   ALT 25  --   --  27  --  36  --   ALKPHOS 56  --   --  62  --  83  --   BILITOT 0.6  --   --  0.7  --  0.8  --   ALBUMIN 1.7*  --   --  1.8*  --  1.8*  --   MG 1.0* 1.3* 1.9 1.7  --  2.6* 2.0  LATICACIDVEN  --   --   --   --  0.8  --   --   INR  --   --   --   --   --  1.1  --      ------------------------------------------------------------------------------------------------------------------ No results for input(s): "CHOL", "HDL", "LDLCALC", "TRIG", "CHOLHDL", "LDLDIRECT" in the last 72 hours.   No results found for:  "HGBA1C" ------------------------------------------------------------------------------------------------------------------ No results for input(s): "TSH", "T4TOTAL", "T3FREE", "THYROIDAB" in the last 72 hours.  Invalid input(s): "FREET3"  Cardiac Enzymes No results for input(s): "CKMB", "TROPONINI", "MYOGLOBIN" in the last 168 hours.  Invalid input(s): "CK" ------------------------------------------------------------------------------------------------------------------ No results found for: "BNP"  CBG: Recent Labs  Lab 02/02/23 1151 02/02/23 1546 02/02/23 1946 02/03/23 0519 02/03/23 0918  GLUCAP 155* 174* 189* 158* 133*     Recent Results (from the past 240 hour(s))  Blood culture (routine x 2)     Status: None   Collection Time: 01/24/23  2:52 PM   Specimen: BLOOD  Result Value Ref Range Status   Specimen Description   Final    BLOOD LEFT ANTECUBITAL Performed at [facility], [address], [place], [place] [address]    Special Requests   Final    BOTTLES DRAWN AEROBIC AND ANAEROBIC Blood Culture adequate volume Performed at [facility], [address] [place], [place] [address]    Culture   Final    NO GROWTH 5 DAYS Performed at [facility], [address] [place], [place] [address]    Report Status 01/29/2023 FINAL  Final  Culture, blood (Routine X 2) w Reflex to ID Panel     Status: None   Collection Time: 01/24/23  9:12 PM   Specimen: BLOOD LEFT HAND  Result Value Ref Range Status   Specimen Description   Final    BLOOD LEFT HAND Performed at [facility], [address] [place], [place] [address]    Special Requests   Final    BOTTLES DRAWN AEROBIC AND ANAEROBIC BOTTLES DRAWN AEROBIC ONLY Performed at [facility], [address], [place], [place] [address]    Culture   Final    NO GROWTH 5 DAYS Performed at [facility],  [address], [place], [place] [address]     Report Status 01/29/2023 FINAL  Final  Urine Culture     Status: Abnormal   Collection Time: 01/31/23  2:22 PM   Specimen: Urine, Clean Catch  Result Value Ref Range Status   Specimen Description   Final    URINE, CLEAN CATCH Performed at [facility], [address] [place], [place] [address]    Special Requests   Final    NONE Performed at [facility], [address] [place], [place] [address]    Culture MULTIPLE SPECIES PRESENT, SUGGEST RECOLLECTION (A)  Final   Report Status 02/01/2023 FINAL  Final  Culture, blood (Routine X 2) w Reflex to ID Panel     Status: None (Preliminary result)   Collection Time: 02/01/23  9:51 AM   Specimen: BLOOD RIGHT HAND  Result Value Ref Range Status   Specimen Description   Final    BLOOD RIGHT HAND Performed at [facility], [address], [place], [place] [address]    Special Requests   Final    BOTTLES DRAWN AEROBIC AND ANAEROBIC Blood Culture adequate volume Performed at [facility], [address], [place], [place] [address]    Culture   Final    NO GROWTH 2 DAYS Performed at [facility], [address], [place], [place] [address]    Report Status PENDING  Incomplete  Culture, blood (Routine X 2) w Reflex to ID Panel     Status: None (Preliminary result)   Collection Time: 02/01/23  9:52 AM   Specimen: Right Antecubital; Blood  Result Value Ref Range Status   Specimen Description   Final    RIGHT ANTECUBITAL BLOOD Performed at [facility], [address], [place], [place] [address]    Special Requests   Final    BOTTLES DRAWN AEROBIC AND ANAEROBIC Blood Culture adequate volume Performed at [facility], [address], [place], [place] [address]    Culture   Final    NO GROWTH 2 DAYS Performed at [facility], [address], [place], [place] [address]    Report Status PENDING  Incomplete  MRSA Next Gen by  PCR, Nasal     Status: None   Collection Time: 02/01/23  9:52 AM   Specimen: Nasal Mucosa; Nasal Swab  Result Value Ref Range Status   MRSA by PCR Next Gen NOT DETECTED NOT DETECTED Final    Comment: (NOTE) The GeneXpert MRSA Assay (FDA approved for NASAL specimens only), is one component of a comprehensive MRSA colonization surveillance program. It is not intended to diagnose MRSA infection nor to guide or monitor treatment for MRSA infections. Test performance is not FDA approved in patients less than 43 years old. Performed at [facility], [address] [place], [place] [address]      Radiology Studies: No results found.   Pamella Pert, MD, PhD [facility]  Between 7 am - 7 pm I am available, please contact me via Amion (for emergencies) or Securechat (non urgent messages)  Between 7 pm - 7 am I am not available, please contact night coverage MD/APP via Amion

## 2023-02-04 DIAGNOSIS — D62 Acute posthemorrhagic anemia: Secondary | ICD-10-CM

## 2023-02-04 DIAGNOSIS — I10 Essential (primary) hypertension: Secondary | ICD-10-CM

## 2023-02-04 DIAGNOSIS — Z8673 Personal history of transient ischemic attack (TIA), and cerebral infarction without residual deficits: Secondary | ICD-10-CM

## 2023-02-04 DIAGNOSIS — R6510 Systemic inflammatory response syndrome (SIRS) of non-infectious origin without acute organ dysfunction: Secondary | ICD-10-CM

## 2023-02-04 DIAGNOSIS — K56609 Unspecified intestinal obstruction, unspecified as to partial versus complete obstruction: Secondary | ICD-10-CM | POA: Diagnosis not present

## 2023-02-04 LAB — CBC
HCT: 25.4 % — ABNORMAL LOW (ref 36.0–46.0)
Hemoglobin: 8.3 g/dL — ABNORMAL LOW (ref 12.0–15.0)
MCH: 27.7 pg (ref 26.0–34.0)
MCHC: 32.7 g/dL (ref 30.0–36.0)
MCV: 84.7 fL (ref 80.0–100.0)
Platelets: 178 10*3/uL (ref 150–400)
RBC: 3 MIL/uL — ABNORMAL LOW (ref 3.87–5.11)
RDW: 18.2 % — ABNORMAL HIGH (ref 11.5–15.5)
WBC: 11 10*3/uL — ABNORMAL HIGH (ref 4.0–10.5)
nRBC: 1.2 % — ABNORMAL HIGH (ref 0.0–0.2)

## 2023-02-04 LAB — BASIC METABOLIC PANEL
Anion gap: 7 (ref 5–15)
BUN: 25 mg/dL — ABNORMAL HIGH (ref 6–20)
CO2: 26 mmol/L (ref 22–32)
Calcium: 7.8 mg/dL — ABNORMAL LOW (ref 8.9–10.3)
Chloride: 104 mmol/L (ref 98–111)
Creatinine, Ser: 0.5 mg/dL (ref 0.44–1.00)
GFR, Estimated: >60 mL/min (ref >60)
Glucose, Bld: 109 mg/dL — ABNORMAL HIGH (ref 70–99)
Potassium: 3.7 mmol/L (ref 3.5–5.1)
Sodium: 137 mmol/L (ref 135–145)

## 2023-02-04 LAB — PHOSPHORUS: Phosphorus: 3.8 mg/dL (ref 2.5–4.6)

## 2023-02-04 LAB — MAGNESIUM: Magnesium: 1.5 mg/dL — ABNORMAL LOW (ref 1.7–2.4)

## 2023-02-04 LAB — CULTURE, BLOOD (ROUTINE X 2)

## 2023-02-04 LAB — GLUCOSE, CAPILLARY
Glucose-Capillary: 125 mg/dL — ABNORMAL HIGH (ref 70–99)
Glucose-Capillary: 93 mg/dL (ref 70–99)
Glucose-Capillary: 71 mg/dL (ref 70–99)

## 2023-02-04 MED ORDER — MIRTAZAPINE 15 MG PO TBDP
15.0000 mg | ORAL_TABLET | Freq: Once | ORAL | Status: AC
  Administered 2023-02-04: 15 mg via ORAL
  Filled 2023-02-04: qty 1

## 2023-02-04 MED ORDER — POTASSIUM CHLORIDE 10 MEQ/50ML IV SOLN
10.0000 meq | INTRAVENOUS | Status: AC
  Filled 2023-02-04 (×2): qty 50

## 2023-02-04 MED ORDER — HYDROMORPHONE HCL 1 MG/ML IJ SOLN
1.0000 mg | INTRAMUSCULAR | Status: DC | PRN
  Administered 2023-02-04 – 2023-02-05 (×3): 1 mg via INTRAVENOUS
  Filled 2023-02-04 (×3): qty 1

## 2023-02-04 MED ORDER — MAGNESIUM SULFATE 2 GM/50ML IV SOLN
2.0000 g | Freq: Once | INTRAVENOUS | Status: AC
  Administered 2023-02-04: 2 g via INTRAVENOUS
  Filled 2023-02-04: qty 50

## 2023-02-04 MED ORDER — INSULIN ASPART 100 UNIT/ML IJ SOLN: 0.0000 [IU] | Freq: Four times a day (QID) | INTRAMUSCULAR | Status: DC

## 2023-02-04 MED ORDER — BISACODYL 10 MG RE SUPP
10.0000 mg | Freq: Once | RECTAL | Status: AC
  Administered 2023-02-04: 10 mg via RECTAL
  Filled 2023-02-04: qty 1

## 2023-02-04 MED ORDER — TRAVASOL 10 % IV SOLN
INTRAVENOUS | Status: AC
  Filled 2023-02-04: qty 1056

## 2023-02-04 NOTE — Progress Notes (Signed)
Progress Note    Tara Berger   ZOX:[id number]  DOB: 02/24/1971  DOA: 01/24/2023     11 PCP: Pcp, No  Initial CC: abdominal pain  Hospital Course: Tara Berger is a 52 y.o. 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  She was recently admitted last month for SBO requiring NGT placement, TPN with plans to do surgery after 2 weeks of optimization, but signed AMA before general surgery completed her treatment plan.  She returned to the emergency department 01/24/23 with complaints of abdominal distention, abdominal pain, nausea, and multiple episodes of emesis over the prior 2 weeks.   At time of admission, imaging revealed high-grade small bowel obstruction.   Patient was admitted to the hospital and general surgery was consulted.  She eventually underwent ex lap with lysis of adhesions and resection of ileocolic anastomosis and creation of new ileocolic anastomosis on 01/29/23.   Interval History:  Resting in bed when seen this morning.  Denies nausea/vomiting.  Trial of clear liquids being started today.  She has been able to get out of bed some and ambulate with PT but not since 6/19 per notes it looks like.   Assessment and Plan: * SBO (small bowel obstruction) (HCC) - CT abdomen/pelvis obtained 01/25/2023 showing high-grade distal small bowel obstruction -Patient followed by general surgery and taken to the OR on 01/29/2023 for ex lap, LOA, resection of old ileocolic anastomosis and formation of new ileocolic anastomosis -Has had expected postop ileus and been on TPN - no N/V recently but BM/flatus still labile - trial of CLD today per surgery  ABLA (acute blood loss anemia) - s/p 3 units PRBC total (6/18, 6/20, 6/21) - potential reaction on 6/20 but  tolerated 6/21 with pre-meds; but if no issue with 6/18 transfusion, then lower suspicion for any reactions  SIRS (systemic inflammatory response syndrome) (HCC) - patient with high fever, tachycardia, chills 6/20.  This was roughly into halfway getting a blood transfusion.  Cannot fully rule out transfusion reaction versus sepsis physiology given recent intra-abdominal surgery.  Blood cultures were obtained at that point and she was started on antibiotics.   - CT scan abdomen pelvis without acute findings.  CT angio without PE. - blood cultures remain negative x 3 days; Urine cx undetermined - mild WBC but could be reactive  - continue abx for now  Essential hypertension - No home meds for BP control -Does have sustained sinus tach likely from acute illness; continue PRN Lopressor and will start on oral if tolerates clear liquids  History of CVA (cerebrovascular accident) - chronic left hemiparesis s/p CVA 07/2022  Hypomagnesemia - replete as needed  Malnutrition of moderate degree - RD following as well - has been on TPN; weaning as diet advances   Tobacco use disorder - Continue nicotine patch  History of pulmonary embolism - hx noncompliance with Eliquis outpatient; she has been counseled on better compliance - resuming when able  Crohn's disease (HCC) GI saw patient on 6/15, did not think steroids will be beneficial as this is likely a fibrotic stenosis rather than inflammatory, she has plans to start biologic therapy with outpatient GI through [facility].    Old records reviewed in assessment of this patient  Antimicrobials: Cefepime 6/20 >> current Flagyl 6/20 >> current   DVT prophylaxis:  SCD  Code Status:  Code Status: Full Code  Mobility Assessment (last 72 hours)     Mobility Assessment     Row Name 02/03/23 2320 02/03/23 1702 02/02/23 0800 02/01/23 2000     Does patient have an order for bedrest or is patient medically unstable No - Continue  assessment No - Continue assessment No - Continue assessment No - Continue assessment    What is the highest level of mobility based on the progressive mobility assessment? Level 5 (Walks with assist in room/hall) - Balance while stepping forward/back and can walk in room with assist - Complete Level 5 (Walks with assist in room/hall) - Balance while stepping forward/back and can walk in room with assist - Complete Level 5 (Walks with assist in room/hall) - Balance while stepping forward/back and can walk in room with assist - Complete Level 5 (Walks with assist in room/hall) - Balance while stepping forward/back and can walk in room with assist - Complete             Barriers to discharge:  Disposition Plan:  TBD Status is: Inpt  Objective: Blood pressure (!) 126/94, pulse 96, temperature 98.9 F (37.2 C), temperature source Oral, resp. rate 18, height 5\' 2"  (1.575 m), weight 59.5 kg, SpO2 100 %.  Examination:  Physical Exam Constitutional:      Appearance: Normal appearance.  HENT:     Head: Normocephalic and atraumatic.     Mouth/Throat:     Mouth: Mucous membranes are moist.  Eyes:     Extraocular Movements: Extraocular movements intact.  Cardiovascular:     Rate and Rhythm: Regular rhythm. Tachycardia present.  Pulmonary:     Effort: Pulmonary effort is normal.     Breath sounds: Normal breath sounds.  Abdominal:     General: There is no distension.     Palpations: Abdomen is soft.     Comments: Decreased BS in RUQ; expected TTP; midline incision noted with staples in place  Musculoskeletal:        General: No swelling. Normal range of motion.     Cervical back: Normal range of motion and neck supple.  Skin:    General: Skin is warm and dry.  Neurological:     Mental Status: She is alert. Mental status is at baseline.  Psychiatric:        Mood and Affect: Mood normal.      Consultants:  Surgery  Procedures:  6/17: EXPLORATORY LAPAROTOMY; LYSIS OF ADHESIONS;  RESECTION OF ILEOCOLIC ANASTOMOSIS; NEW ILEOCOLIC ANASTOMOSIS  Data Reviewed: Results for orders placed or performed during the hospital encounter of 01/24/23 (from the past 24 hour(s))  Glucose, capillary     Status: Abnormal   Collection Time: 02/03/23  4:55 PM  Result Value Ref Range   Glucose-Capillary 124 (H) 70 - 99 mg/dL  Glucose, capillary     Status: Abnormal   Collection Time: 02/04/23  1:10 AM  Result Value Ref Range   Glucose-Capillary 125 (H) 70 - 99 mg/dL  Basic metabolic panel     Status: Abnormal   Collection Time: 02/04/23  2:54 AM  Result Value Ref Range   Sodium 137 135 - 145 mmol/L   Potassium 3.7 3.5 - 5.1 mmol/L   Chloride 104 98 - 111 mmol/L   CO2 26 22 - 32 mmol/L   Glucose, Bld 109 (H) 70 - 99 mg/dL   BUN 25 (H) 6 - 20 mg/dL   Creatinine, Ser 7.84 0.44 - 1.00 mg/dL   Calcium 7.8 (L)  8.9 - 10.3 mg/dL   GFR, Estimated >40 >98 mL/min   Anion gap 7 5 - 15  Magnesium     Status: Abnormal   Collection Time: 02/04/23  2:54 AM  Result Value Ref Range   Magnesium 1.5 (L) 1.7 - 2.4 mg/dL  Phosphorus     Status: None   Collection Time: 02/04/23  2:54 AM  Result Value Ref Range   Phosphorus 3.8 2.5 - 4.6 mg/dL  CBC     Status: Abnormal   Collection Time: 02/04/23  2:54 AM  Result Value Ref Range   WBC 11.0 (H) 4.0 - 10.5 K/uL   RBC 3.00 (L) 3.87 - 5.11 MIL/uL   Hemoglobin 8.3 (L) 12.0 - 15.0 g/dL   HCT 11.9 (L) 14.7 - 82.9 %   MCV 84.7 80.0 - 100.0 fL   MCH 27.7 26.0 - 34.0 pg   MCHC 32.7 30.0 - 36.0 g/dL   RDW 56.2 (H) 13.0 - 86.5 %   Platelets 178 150 - 400 K/uL   nRBC 1.2 (H) 0.0 - 0.2 %  Glucose, capillary     Status: None   Collection Time: 02/04/23  5:36 AM  Result Value Ref Range   Glucose-Capillary 93 70 - 99 mg/dL  Glucose, capillary     Status: None   Collection Time: 02/04/23 11:38 AM  Result Value Ref Range   Glucose-Capillary 71 70 - 99 mg/dL    I have reviewed pertinent nursing notes, vitals, labs, and images as necessary. I have  ordered labwork to follow up on as indicated.  I have reviewed the last notes from staff over past 24 hours. I have discussed patient's care plan and test results with nursing staff, CM/SW, and other staff as appropriate.  Time spent: Greater than 50% of the 55 minute visit was spent in counseling/coordination of care for the patient as laid out in the A&P.   LOS: 11 days   Lewie Chamber, MD [facility] 02/04/2023, 12:23 PM

## 2023-02-04 NOTE — Assessment & Plan Note (Addendum)
-   patient with high fever, tachycardia, chills 6/20.  This was roughly into halfway getting a blood transfusion.  Cannot fully rule out transfusion reaction versus sepsis physiology given recent intra-abdominal surgery.  Blood cultures were obtained at that point and she was started on antibiotics.   - CT scan abdomen pelvis without acute findings.  CT angio without PE. - blood cultures remain negative; Urine cx undetermined - mild WBC but could have been reactive and has now remained normal - PCT elevated but WBC normalized and has completed 5 days abx. At this time will stop abx and monitor clinically

## 2023-02-04 NOTE — Assessment & Plan Note (Signed)
GI saw patient on 6/15, did not think steroids will be beneficial as this is likely a fibrotic stenosis rather than inflammatory, she has plans to start biologic therapy with outpatient GI through [facility].

## 2023-02-04 NOTE — Assessment & Plan Note (Signed)
Continue nicotine patch  °

## 2023-02-04 NOTE — Assessment & Plan Note (Signed)
-   No home meds for BP control -Does have sustained sinus tach likely from acute illness; continue PRN Lopressor and will start on oral if tolerates clear liquids

## 2023-02-04 NOTE — Assessment & Plan Note (Addendum)
-   hx noncompliance with Eliquis outpatient; she has been counseled on better compliance - resuming when able/cleared by surgery

## 2023-02-04 NOTE — Assessment & Plan Note (Addendum)
-   chronic left hemiparesis s/p CVA 07/2022

## 2023-02-04 NOTE — Progress Notes (Signed)
Resume patient care, agree with previous nurse assessment, will continue to monitor. 

## 2023-02-04 NOTE — Hospital Course (Addendum)
Tara Berger is a 52 y.o. 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  She was recently admitted last month for SBO requiring NGT placement, TPN with plans to do surgery after 2 weeks of optimization, but signed AMA before general surgery completed her treatment plan.  She returned to the emergency department 01/24/23 with complaints of abdominal distention, abdominal pain, nausea, and multiple episodes of emesis over the prior 2 weeks.   At time of admission, imaging revealed high-grade small bowel obstruction.   Patient was admitted to the hospital and general surgery was consulted.  She eventually underwent ex lap with lysis of adhesions and resection of ileocolic anastomosis and creation of new ileocolic anastomosis on 01/29/23.

## 2023-02-04 NOTE — Assessment & Plan Note (Addendum)
-   s/p 3 units PRBC total (6/18, 6/20, 6/21) - potential reaction on 6/20 but tolerated 6/21 with pre-meds; but if no issue with 6/18 transfusion, then lower suspicion for any reactions

## 2023-02-04 NOTE — Assessment & Plan Note (Addendum)
-   CT abdomen/pelvis obtained 01/25/2023 showing high-grade distal small bowel obstruction -Patient followed by general surgery and taken to the OR on 01/29/2023 for ex lap, LOA, resection of old ileocolic anastomosis and formation of new ileocolic anastomosis -Has had expected postop ileus and been on TPN - tolerated CLD followed by soft diet (started 6/24) - she has also achieved a BM both yesterday and this morning  - surgery planning for TPN 1/2 and goal off soon

## 2023-02-04 NOTE — Assessment & Plan Note (Signed)
-   RD following as well - has been on TPN; weaning as diet advances

## 2023-02-04 NOTE — Progress Notes (Signed)
       CROSS COVER NOTE  NAME: Tara Berger MRN: [id number] DOB : Apr 29, 1971    Concern as stated by nurse / staff    Pt admitted with sbo v/s / 98.7, 156/110,126, 17. EKG completed and in chart St, can she have something for her HR and BP?     Pertinent findings on chart review: Chart revieed  Assessment and  Interventions   Assessment: Heart rate has been 120-130 and sinus rhythm , now new as well as he SBP 150's :Pain, anxiety and ?fluid therapy contributing  Metoprolol 5 mg heart rate sustained above 130 or SBP above 165 every 6h as needed       Donnie Mesa NP [facility] Cross Cover 7pm-7am - check amion for availability Pager [phone]

## 2023-02-04 NOTE — Progress Notes (Signed)
Mobility Specialist - Progress Note   02/04/23 1528  Mobility  Activity Ambulated with assistance in hallway  Level of Assistance Contact guard assist, steadying assist  Assistive Device Front wheel walker  Distance Ambulated (ft) 200 ft  Range of Motion/Exercises Active Assistive  Activity Response Tolerated fair  Mobility Referral Yes  $Mobility charge 1 Mobility  Mobility Specialist Start Time (ACUTE ONLY) 1459  Mobility Specialist Stop Time (ACUTE ONLY) 1528  Mobility Specialist Time Calculation (min) (ACUTE ONLY) 29 min   Pt was found in bed and agreeable to ambulate. Was able to perform x1 STS without cues. On the second STS required lots of verbal and physical cues for hand placements on the bed to get up and on the RW to ambulate. During ambulation stated being wobbly. Has shuffled steps during ambulation. Needs verbal and physical cues to avoid hitting objects and stated being visually impaired. At EOS returned to bed with all needs met. Call bell in reach and RN notified of session.  Billey Chang Mobility Specialist

## 2023-02-04 NOTE — Assessment & Plan Note (Signed)
-   replete as needed 

## 2023-02-04 NOTE — Progress Notes (Signed)
PHARMACY - TOTAL PARENTERAL NUTRITION CONSULT NOTE   Indication: Small bowel obstruction  Patient Measurements: Height: 5\' 2"  (157.5 cm) Weight: 59.5 kg (131 lb 2.8 oz) IBW/kg (Calculated) : 50.1 TPN AdjBW (KG): 56.5 Body mass index is 23.99 kg/m.  Assessment:  Pharmacy consulted to start TPN on 52 yo female admitted with abdominal pain, distention, and nausea with episodes of emesis over the past 2 weeks. Extensive history of small bowel stricturing and prior surgeries d/t Crohn's disease. CT shows distal SBO. Recent admission in March 2024 for SBO with NGT & TPN, plans for surgery, but pt left AMA. Returned 6/12 then underwent surgery on 6/17.   Glucose / Insulin: no hx of DM  - CBGs well controlled apart from brief spike after postop Decadron.  SSI discontinued 6/20 and restarted 6/22 for goal CBGs 100-150.  Previous 24 hour CBG range and insulin use: 93-165, 3 units CBGs improved but this morning's glucose below goal at 93 after one unit administered Electrolytes:  - Na now WNL after increasing concentration in TPN yesterday.  - K now borderline low at 3.7 after removing from TPN since 6/21.   - Phos steady at 3.8 after having reduced concentration on 6/21.   - Magnesium low at 1.5 after removing from TPN since 6/21. Renal: SCr down to 0.5, BUN down to 25; UOP improving Hepatic: LFTs, Tbili stable WNL; albumin low and stable - TG WNL (6/17) I/O:  - Urine Output: 875 and 3 times unmeasured - I/O Net: 2936.4 - LBM: 6/19 - MIVF: NS @ 40 ml/hr  GI Imaging: - 6/13 CTa/p: high-grade distal small-bowel obstruction d/t stricture vs adhesions; no active Crohn's dz noted - 6/15 AXR: persistent SBO GI Surgeries / Procedures:  - 6/17: Ex lap with lysis of adhesions, resection of prior ileocolic anastomosis (which had become strictured), new ileocolic anastomosis. 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  Central access:  PICC TPN start date: 6/16   Nutritional Goals: Current TPN formulation (55 g AA, 15% CHO, 30 g/L lipid) at 80 ml/hr provides 105 g protein, 1974 kcal, 1920 mL daily - Keep K >/= 4.0; Mg >/= 2.0 for ileus  RD Assessment: Estimated Needs Total Energy Estimated Needs: 1800-2000 Total Protein Estimated Needs: 90-105g Total Fluid Estimated Needs: 2L/day  Current Nutrition:  NPO, TPN  Plan:  This morning,   Magnesium 2 g IV x 1 KCl 10 mEq IV q1h x 2  Continue TPN at goal 80 mL/hr Electrolytes in TPN:  Na 110 mEq/L K 20 mEq/L - increased Ca 5 mEq/L Mg 4 mEq/L - increased Phos 10 mmol/L Cl:Ac = 1:2 Add standard MVI and trace elements to TPN MIVF - NS 40 ml/hr; further changes per MD Change SSI to very sensitive SSI q6h Monitor TPN labs on Mon/Thurs  Thank you for allowing pharmacy to be a part of this patient's care.  Selinda Eon, PharmD, BCPS Clinical Pharmacist [facility] 02/04/2023 7:35 AM

## 2023-02-04 NOTE — Progress Notes (Signed)
       CROSS COVER NOTE  NAME: Tara Berger MRN: [id number] DOB : 1970-12-11    Concern as stated by nurse / staff     this pt came in with abd distention, abd pain, N/V. hx of crohns dse, cystitis and Cva and has L sided hemiparesis. V/s T 98.1, PR 128 BP 160/100 RR 16 Spo2 100 on RA, patient is complaining of anxiety and being so nervous, she said she's been taking anti anxiety meds at home but not regularly taking it and can't remember the exact name of the medicine, can we give her something to calm her down? thank you.    Pertinent findings on chart review: Remeron on home list  Assessment and  Interventions   Assessment:  Plan: Remeron disintegrating tablets 15 mg x 1       Donnie Mesa NP [facility] Cross Cover 7pm-7am - check amion for availability Pager [phone]

## 2023-02-04 NOTE — Progress Notes (Signed)
6 Days Post-Op   Subjective/Chief Complaint: No flatus or bm last 2 days Reports minimal abdominal pain, no bloating or nausea   Objective: Vital signs in last 24 hours: Temp:  [98.1 F (36.7 C)-99.1 F (37.3 C)] 98.9 F (37.2 C) (06/23 0509) Pulse Rate:  [96-129] 96 (06/23 0509) Resp:  [11-22] 18 (06/23 0509) BP: (126-161)/(94-122) 126/94 (06/23 0509) SpO2:  [94 %-100 %] 100 % (06/23 0509) Last BM Date : 01/31/23  Intake/Output from previous day: 06/22 0701 - 06/23 0700 In: 3811.4 [P.O.:20; I.V.:3120.2; IV Piggyback:671.2] Out: 875 [Urine:875] Intake/Output this shift: No intake/output data recorded.  Exam: Awake and alert Abdomen soft, non-distended, minimally tender, incision clean  Lab Results:  Recent Labs    02/02/23 0414 02/02/23 2103 02/04/23 0254  WBC 8.6  --  11.0*  HGB 7.0* 8.8* 8.3*  HCT 21.3* 26.8* 25.4*  PLT 103*  --  178   BMET Recent Labs    02/03/23 0553 02/04/23 0254  NA 134* 137  K 4.8 3.7  CL 104 104  CO2 24 26  GLUCOSE 165* 109*  BUN 26* 25*  CREATININE 0.52 0.50  CALCIUM 7.8* 7.8*   PT/INR Recent Labs    02/02/23 0414  LABPROT 14.1  INR 1.1   ABG No results for input(s): "PHART", "HCO3" in the last 72 hours.  Invalid input(s): "PCO2", "PO2"  Studies/Results: No results found.  Anti-infectives: Anti-infectives (From admission, onward)    Start     Dose/Rate Route Frequency Ordered Stop   02/02/23 1000  vancomycin (VANCOREADY) IVPB 1250 mg/250 mL  Status:  Discontinued        1,250 mg 166.7 mL/hr over 90 Minutes Intravenous Every 24 hours 02/01/23 1246 02/02/23 1216   02/01/23 1200  ceFEPIme (MAXIPIME) 2 g in sodium chloride 0.9 % 100 mL IVPB        2 g 200 mL/hr over 30 Minutes Intravenous Every 8 hours 02/01/23 1106     02/01/23 1100  vancomycin (VANCOREADY) IVPB 1250 mg/250 mL        1,250 mg 166.7 mL/hr over 90 Minutes Intravenous NOW 02/01/23 1001 02/01/23 1148   02/01/23 1015  metroNIDAZOLE (FLAGYL) IVPB 500  mg        500 mg 100 mL/hr over 60 Minutes Intravenous 2 times daily 02/01/23 1009     02/01/23 0830  Ampicillin-Sulbactam (UNASYN) 3 g in sodium chloride 0.9 % 100 mL IVPB  Status:  Discontinued        3 g 200 mL/hr over 30 Minutes Intravenous Every 6 hours 02/01/23 0744 02/01/23 1010   01/29/23 0800  cefoTEtan (CEFOTAN) 2 g in sodium chloride 0.9 % 100 mL IVPB        2 g 200 mL/hr over 30 Minutes Intravenous On call to O.R. 01/28/23 0815 01/29/23 1301       Assessment/Plan: POD 6 s/p ex lap, loa, resection of ileocolic anastomosis with new ileocolic anastomosis for sbo 2/2 strictured ileocolic anastomosis by Dr. Magnus Ivan 01/29/23   D/c PCA Try full liquids Rectal suppos x 1 Hgb stable   LOS: 11 days    Tara Berger 02/04/2023

## 2023-02-05 DIAGNOSIS — R6510 Systemic inflammatory response syndrome (SIRS) of non-infectious origin without acute organ dysfunction: Secondary | ICD-10-CM | POA: Diagnosis not present

## 2023-02-05 DIAGNOSIS — I10 Essential (primary) hypertension: Secondary | ICD-10-CM | POA: Diagnosis not present

## 2023-02-05 DIAGNOSIS — D62 Acute posthemorrhagic anemia: Secondary | ICD-10-CM | POA: Diagnosis not present

## 2023-02-05 DIAGNOSIS — K56609 Unspecified intestinal obstruction, unspecified as to partial versus complete obstruction: Secondary | ICD-10-CM | POA: Diagnosis not present

## 2023-02-05 LAB — COMPREHENSIVE METABOLIC PANEL
ALT: 35 U/L (ref 0–44)
AST: 26 U/L (ref 15–41)
Albumin: 1.9 g/dL — ABNORMAL LOW (ref 3.5–5.0)
Alkaline Phosphatase: 169 U/L — ABNORMAL HIGH (ref 38–126)
Anion gap: 7 (ref 5–15)
BUN: 26 mg/dL — ABNORMAL HIGH (ref 6–20)
CO2: 27 mmol/L (ref 22–32)
Calcium: 7.8 mg/dL — ABNORMAL LOW (ref 8.9–10.3)
Chloride: 106 mmol/L (ref 98–111)
Creatinine, Ser: 0.61 mg/dL (ref 0.44–1.00)
GFR, Estimated: >60 mL/min (ref >60)
Glucose, Bld: 110 mg/dL — ABNORMAL HIGH (ref 70–99)
Potassium: 3.6 mmol/L (ref 3.5–5.1)
Sodium: 140 mmol/L (ref 135–145)
Total Bilirubin: 0.5 mg/dL (ref 0.3–1.2)
Total Protein: 4.8 g/dL — ABNORMAL LOW (ref 6.5–8.1)

## 2023-02-05 LAB — CBC WITH DIFFERENTIAL/PLATELET
Abs Immature Granulocytes: 0.51 10*3/uL — ABNORMAL HIGH (ref 0.00–0.07)
Basophils Absolute: 0.1 10*3/uL (ref 0.0–0.1)
Basophils Relative: 1 %
Eosinophils Absolute: 0.3 10*3/uL (ref 0.0–0.5)
Eosinophils Relative: 4 %
HCT: 26.8 % — ABNORMAL LOW (ref 36.0–46.0)
Hemoglobin: 8.4 g/dL — ABNORMAL LOW (ref 12.0–15.0)
Immature Granulocytes: 6 %
Lymphocytes Relative: 20 %
Lymphs Abs: 1.8 10*3/uL (ref 0.7–4.0)
MCH: 27.1 pg (ref 26.0–34.0)
MCHC: 31.3 g/dL (ref 30.0–36.0)
MCV: 86.5 fL (ref 80.0–100.0)
Monocytes Absolute: 1.1 10*3/uL — ABNORMAL HIGH (ref 0.1–1.0)
Monocytes Relative: 12 %
Neutro Abs: 5.3 10*3/uL (ref 1.7–7.7)
Neutrophils Relative %: 57 %
Platelets: 223 10*3/uL (ref 150–400)
RBC: 3.1 MIL/uL — ABNORMAL LOW (ref 3.87–5.11)
RDW: 17.6 % — ABNORMAL HIGH (ref 11.5–15.5)
WBC: 9.1 10*3/uL (ref 4.0–10.5)
nRBC: 0.5 % — ABNORMAL HIGH (ref 0.0–0.2)

## 2023-02-05 LAB — GLUCOSE, CAPILLARY
Glucose-Capillary: 100 mg/dL — ABNORMAL HIGH (ref 70–99)
Glucose-Capillary: 104 mg/dL — ABNORMAL HIGH (ref 70–99)
Glucose-Capillary: 108 mg/dL — ABNORMAL HIGH (ref 70–99)
Glucose-Capillary: 109 mg/dL — ABNORMAL HIGH (ref 70–99)
Glucose-Capillary: 117 mg/dL — ABNORMAL HIGH (ref 70–99)
Glucose-Capillary: 124 mg/dL — ABNORMAL HIGH (ref 70–99)

## 2023-02-05 LAB — PHOSPHORUS: Phosphorus: 4.4 mg/dL (ref 2.5–4.6)

## 2023-02-05 LAB — TRIGLYCERIDES: Triglycerides: 117 mg/dL (ref <150)

## 2023-02-05 LAB — PROCALCITONIN: Procalcitonin: 2.17 ng/mL

## 2023-02-05 LAB — CULTURE, BLOOD (ROUTINE X 2): Special Requests: ADEQUATE

## 2023-02-05 LAB — MAGNESIUM: Magnesium: 1.6 mg/dL — ABNORMAL LOW (ref 1.7–2.4)

## 2023-02-05 MED ORDER — INSULIN ASPART 100 UNIT/ML IJ SOLN: 0.0000 [IU] | Freq: Two times a day (BID) | INTRAMUSCULAR | Status: DC

## 2023-02-05 MED ORDER — POTASSIUM CHLORIDE 10 MEQ/50ML IV SOLN
10.0000 meq | INTRAVENOUS | Status: AC
  Administered 2023-02-05 (×2): 10 meq via INTRAVENOUS
  Filled 2023-02-05 (×2): qty 50

## 2023-02-05 MED ORDER — MAGNESIUM SULFATE 2 GM/50ML IV SOLN
2.0000 g | Freq: Once | INTRAVENOUS | Status: AC
  Administered 2023-02-05: 2 g via INTRAVENOUS
  Filled 2023-02-05: qty 50

## 2023-02-05 MED ORDER — TRAVASOL 10 % IV SOLN
INTRAVENOUS | Status: AC
  Filled 2023-02-05: qty 1056

## 2023-02-05 MED ORDER — KETOROLAC TROMETHAMINE 15 MG/ML IJ SOLN
15.0000 mg | Freq: Four times a day (QID) | INTRAMUSCULAR | Status: AC
  Administered 2023-02-05 – 2023-02-07 (×8): 15 mg via INTRAVENOUS
  Filled 2023-02-05 (×8): qty 1

## 2023-02-05 MED ORDER — OXYCODONE HCL 5 MG PO TABS
5.0000 mg | ORAL_TABLET | ORAL | Status: DC | PRN
  Administered 2023-02-06: 10 mg via ORAL
  Administered 2023-02-07: 5 mg via ORAL
  Administered 2023-02-07: 10 mg via ORAL
  Filled 2023-02-05: qty 1
  Filled 2023-02-05 (×3): qty 2

## 2023-02-05 MED ORDER — ACETAMINOPHEN 500 MG PO TABS
1000.0000 mg | ORAL_TABLET | Freq: Four times a day (QID) | ORAL | Status: DC
  Administered 2023-02-05 – 2023-02-07 (×9): 1000 mg via ORAL
  Filled 2023-02-05 (×11): qty 2

## 2023-02-05 MED ORDER — METHOCARBAMOL 500 MG PO TABS
750.0000 mg | ORAL_TABLET | Freq: Four times a day (QID) | ORAL | Status: DC
  Administered 2023-02-05 – 2023-02-07 (×10): 750 mg via ORAL
  Filled 2023-02-05 (×11): qty 2

## 2023-02-05 MED ORDER — METOPROLOL TARTRATE 25 MG PO TABS
25.0000 mg | ORAL_TABLET | Freq: Two times a day (BID) | ORAL | Status: DC
  Administered 2023-02-05 – 2023-02-06 (×3): 25 mg via ORAL
  Filled 2023-02-05 (×3): qty 1

## 2023-02-05 MED ORDER — ENSURE ENLIVE PO LIQD: 237.0000 mL | Freq: Two times a day (BID) | ORAL | Status: DC

## 2023-02-05 MED ORDER — HYDROMORPHONE HCL 1 MG/ML IJ SOLN: 0.5000 mg | INTRAMUSCULAR | Status: DC | PRN

## 2023-02-05 NOTE — Progress Notes (Signed)
General Surgery Follow Up Note  Subjective:    Overnight Issues:   Objective:  Vital signs for last 24 hours: Temp:  [98.3 F (36.8 C)-99.9 F (37.7 C)] 98.3 F (36.8 C) (06/24 0518) Pulse Rate:  [110-131] 115 (06/24 0518) Resp:  [18-20] 18 (06/24 0518) BP: (114-140)/(75-106) 114/85 (06/24 0518) SpO2:  [98 %-100 %] 98 % (06/24 0518) Weight:  [62.7 kg] 62.7 kg (06/24 0604)  Hemodynamic parameters for last 24 hours:    Intake/Output from previous day: 06/23 0701 - 06/24 0700 In: 2188.1 [P.O.:580; I.V.:1377.6; IV Piggyback:230.4] Out: 3100 [Urine:3100]  Intake/Output this shift: Total I/O In: -  Out: 400 [Urine:400]  Vent settings for last 24 hours:    Physical Exam:  Gen: comfortable, no distress Neuro: follows commands, alert, communicative HEENT: PERRL Neck: supple CV: RRR Pulm: unlabored breathing on RA Abd: soft, NT, incision clean, dry, intact  GU: urine clear and yellow  Extr: wwp, no edema  Results for orders placed or performed during the hospital encounter of 01/24/23 (from the past 24 hour(s))  Glucose, capillary     Status: None   Collection Time: 02/04/23 11:38 AM  Result Value Ref Range   Glucose-Capillary 71 70 - 99 mg/dL  Glucose, capillary     Status: Abnormal   Collection Time: 02/04/23 11:58 PM  Result Value Ref Range   Glucose-Capillary 109 (H) 70 - 99 mg/dL  Comprehensive metabolic panel     Status: Abnormal   Collection Time: 02/05/23  3:08 AM  Result Value Ref Range   Sodium 140 135 - 145 mmol/L   Potassium 3.6 3.5 - 5.1 mmol/L   Chloride 106 98 - 111 mmol/L   CO2 27 22 - 32 mmol/L   Glucose, Bld 110 (H) 70 - 99 mg/dL   BUN 26 (H) 6 - 20 mg/dL   Creatinine, Ser 8.11 0.44 - 1.00 mg/dL   Calcium 7.8 (L) 8.9 - 10.3 mg/dL   Total Protein 4.8 (L) 6.5 - 8.1 g/dL   Albumin 1.9 (L) 3.5 - 5.0 g/dL   AST 26 15 - 41 U/L   ALT 35 0 - 44 U/L   Alkaline Phosphatase 169 (H) 38 - 126 U/L   Total Bilirubin 0.5 0.3 - 1.2 mg/dL   GFR,  Estimated >91 >47 mL/min   Anion gap 7 5 - 15  Magnesium     Status: Abnormal   Collection Time: 02/05/23  3:08 AM  Result Value Ref Range   Magnesium 1.6 (L) 1.7 - 2.4 mg/dL  Phosphorus     Status: None   Collection Time: 02/05/23  3:08 AM  Result Value Ref Range   Phosphorus 4.4 2.5 - 4.6 mg/dL  Triglycerides     Status: None   Collection Time: 02/05/23  3:08 AM  Result Value Ref Range   Triglycerides 117 <150 mg/dL  CBC with Differential/Platelet     Status: Abnormal   Collection Time: 02/05/23  3:08 AM  Result Value Ref Range   WBC 9.1 4.0 - 10.5 K/uL   RBC 3.10 (L) 3.87 - 5.11 MIL/uL   Hemoglobin 8.4 (L) 12.0 - 15.0 g/dL   HCT 82.9 (L) 56.2 - 13.0 %   MCV 86.5 80.0 - 100.0 fL   MCH 27.1 26.0 - 34.0 pg   MCHC 31.3 30.0 - 36.0 g/dL   RDW 86.5 (H) 78.4 - 69.6 %   Platelets 223 150 - 400 K/uL   nRBC 0.5 (H) 0.0 - 0.2 %   Neutrophils  Relative % 57 %   Neutro Abs 5.3 1.7 - 7.7 K/uL   Lymphocytes Relative 20 %   Lymphs Abs 1.8 0.7 - 4.0 K/uL   Monocytes Relative 12 %   Monocytes Absolute 1.1 (H) 0.1 - 1.0 K/uL   Eosinophils Relative 4 %   Eosinophils Absolute 0.3 0.0 - 0.5 K/uL   Basophils Relative 1 %   Basophils Absolute 0.1 0.0 - 0.1 K/uL   Immature Granulocytes 6 %   Abs Immature Granulocytes 0.51 (H) 0.00 - 0.07 K/uL  Procalcitonin     Status: None   Collection Time: 02/05/23  3:08 AM  Result Value Ref Range   Procalcitonin 2.17 ng/mL  Glucose, capillary     Status: Abnormal   Collection Time: 02/05/23  5:27 AM  Result Value Ref Range   Glucose-Capillary 117 (H) 70 - 99 mg/dL    Assessment & Plan: The plan of care was discussed with the bedside nurse for the day, who is in agreement with this plan and no additional concerns were raised.   Present on Admission:  SBO (small bowel obstruction) (HCC)  Crohn's disease (HCC)  Tobacco use disorder  Essential hypertension  History of pulmonary embolism  Left hemiparesis (HCC)    LOS: 12 days   Additional  comments:I reviewed the patient's new clinical lab test results.   and I reviewed the patients new imaging test results.    POD7 s/p ex lap, loa, resection of ileocolic anastomosis with new ileocolic anastomosis for sbo 2/2 strictured ileocolic anastomosis by Dr. Magnus Ivan 01/29/23    Pain regimen adjusted +BM x2, adv to soft diet. Consider TPN wean today Hgb stable, WBC normal No indication for abx from surgical standpoint, defer to primary team for non-surgical indications.   Diamantina Monks, MD Trauma & General Surgery Please use [email or web address] to contact on call provider  02/05/2023  *Care during the described time interval was provided by me. I have reviewed this patient's available data, including medical history, events of note, physical examination and test results as part of my evaluation.

## 2023-02-05 NOTE — Progress Notes (Signed)
Progress Note    Tara Berger   ZOX:[id number]  DOB: [birth year]  DOA: 01/24/2023     12 PCP: Pcp, No  Initial CC: abdominal pain  Hospital Course: Tara Berger is a 52 y.o. 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  She was recently admitted last month for SBO requiring NGT placement, TPN with plans to do surgery after 2 weeks of optimization, but signed AMA before general surgery completed her treatment plan.  She returned to the emergency department 01/24/23 with complaints of abdominal distention, abdominal pain, nausea, and multiple episodes of emesis over the prior 2 weeks.   At time of admission, imaging revealed high-grade small bowel obstruction.   Patient was admitted to the hospital and general surgery was consulted.  She eventually underwent ex lap with lysis of adhesions and resection of ileocolic anastomosis and creation of new ileocolic anastomosis on 01/29/23.   Interval History:  No events overnight.  Tolerated clear liquids well yesterday for lunch and dinner.  Being advanced to soft diet today.  Assessment and Plan: * SBO (small bowel obstruction) (HCC) - CT abdomen/pelvis obtained 01/25/2023 showing high-grade distal small bowel obstruction -Patient followed by general surgery and taken to the OR on 01/29/2023 for ex lap, LOA, resection of old ileocolic anastomosis and formation of new ileocolic anastomosis -Has had expected postop ileus and been on TPN - no N/V recently but BM/flatus still labile - tolerated CLD yesterday and advanced further today per surgery; TPN to be weaned soon if tolerates   ABLA (acute blood loss anemia) - s/p 3 units PRBC total (6/18, 6/20, 6/21) - potential reaction on 6/20 but tolerated 6/21 with pre-meds; but  if no issue with 6/18 transfusion, then lower suspicion for any reactions  SIRS (systemic inflammatory response syndrome) (HCC) - patient with high fever, tachycardia, chills 6/20.  This was roughly into halfway getting a blood transfusion.  Cannot fully rule out transfusion reaction versus sepsis physiology given recent intra-abdominal surgery.  Blood cultures were obtained at that point and she was started on antibiotics.   - CT scan abdomen pelvis without acute findings.  CT angio without PE. - blood cultures remain negative x 3 days; Urine cx undetermined - mild WBC but could be reactive  - PCT elevated but WBC normalized and has completed 5 days abx. At this time will stop abx and monitor clinically   Essential hypertension - No home meds for BP control -Does have sustained sinus tach likely from acute illness; continue PRN Lopressor and will start on oral if tolerates clear liquids  History of CVA (cerebrovascular accident) - chronic left hemiparesis s/p CVA 07/2022  Hypomagnesemia - replete as needed  Malnutrition of moderate degree - RD following as well - has been on TPN; weaning as diet advances   Tobacco use disorder - Continue nicotine patch  History of pulmonary embolism - hx noncompliance with Eliquis outpatient; she has been counseled on better compliance - resuming when able  Crohn's disease (HCC) GI saw patient on 6/15, did not think steroids will be beneficial as this is 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    Old records reviewed in assessment of this patient  Antimicrobials: Cefepime 6/20 >> 02/05/2023 Flagyl 6/20 >> 02/05/2023  DVT prophylaxis:  Place  and maintain sequential compression device Start: 02/05/23 0308  Code Status:   Code Status: Full Code  Mobility Assessment (last 72 hours)     Mobility Assessment     Row Name 02/05/23 1318 02/04/23 1100  02/03/23 2320 02/03/23 1702     Does patient have an order for bedrest or is patient medically unstable -- No - Continue assessment No - Continue assessment No - Continue assessment    What is the highest level of mobility based on the progressive mobility assessment? Level 5 (Walks with assist in room/hall) - Balance while stepping forward/back and can walk in room with assist - Complete Level 5 (Walks with assist in room/hall) - Balance while stepping forward/back and can walk in room with assist - Complete Level 5 (Walks with assist in room/hall) - Balance while stepping forward/back and can walk in room with assist - Complete Level 5 (Walks with assist in room/hall) - Balance while stepping forward/back and can walk in room with assist - Complete             Barriers to discharge:  Disposition Plan:  TBD Status is: Inpt  Objective: Blood pressure (!) 142/100, pulse (!) 124, temperature 98.4 F (36.9 C), temperature source Oral, resp. rate 17, height 5\' 2"  (1.575 m), weight 62.7 kg, SpO2 96 %.  Examination:  Physical Exam Constitutional:      Appearance: Normal appearance.  HENT:     Head: Normocephalic and atraumatic.     Mouth/Throat:     Mouth: Mucous membranes are moist.  Eyes:     Extraocular Movements: Extraocular movements intact.  Cardiovascular:     Rate and Rhythm: Regular rhythm. Tachycardia present.  Pulmonary:     Effort: Pulmonary effort is normal.     Breath sounds: Normal breath sounds.  Abdominal:     General: There is no distension.     Palpations: Abdomen is soft.     Comments: Decreased BS in RUQ; expected TTP; midline incision noted with staples in place  Musculoskeletal:        General: No swelling. Normal range of motion.     Cervical back: Normal range of motion and neck supple.  Skin:    General: Skin is warm and dry.  Neurological:     Mental Status: She is alert. Mental status is at baseline.  Psychiatric:        Mood and Affect: Mood normal.       Consultants:  Surgery  Procedures:  6/17: EXPLORATORY LAPAROTOMY; LYSIS OF ADHESIONS; RESECTION OF ILEOCOLIC ANASTOMOSIS; NEW ILEOCOLIC ANASTOMOSIS  Data Reviewed: Results for orders placed or performed during the hospital encounter of 01/24/23 (from the past 24 hour(s))  Glucose, capillary     Status: Abnormal   Collection Time: 02/04/23 11:58 PM  Result Value Ref Range   Glucose-Capillary 109 (H) 70 - 99 mg/dL  Comprehensive metabolic panel     Status: Abnormal   Collection Time: 02/05/23  3:08 AM  Result Value Ref Range   Sodium 140 135 - 145 mmol/L   Potassium 3.6 3.5 - 5.1 mmol/L   Chloride 106 98 - 111 mmol/L   CO2 27 22 - 32 mmol/L   Glucose, Bld 110 (H) 70 - 99 mg/dL   BUN 26 (H) 6 - 20 mg/dL   Creatinine, Ser 8.29 0.44 - 1.00 mg/dL   Calcium 7.8 (L) 8.9 - 10.3 mg/dL   Total Protein 4.8 (L) 6.5 - 8.1 g/dL   Albumin 1.9 (L) 3.5 -  5.0 g/dL   AST 26 15 - 41 U/L   ALT 35 0 - 44 U/L   Alkaline Phosphatase 169 (H) 38 - 126 U/L   Total Bilirubin 0.5 0.3 - 1.2 mg/dL   GFR, Estimated >75 >64 mL/min   Anion gap 7 5 - 15  Magnesium     Status: Abnormal   Collection Time: 02/05/23  3:08 AM  Result Value Ref Range   Magnesium 1.6 (L) 1.7 - 2.4 mg/dL  Phosphorus     Status: None   Collection Time: 02/05/23  3:08 AM  Result Value Ref Range   Phosphorus 4.4 2.5 - 4.6 mg/dL  Triglycerides     Status: None   Collection Time: 02/05/23  3:08 AM  Result Value Ref Range   Triglycerides 117 <150 mg/dL  CBC with Differential/Platelet     Status: Abnormal   Collection Time: 02/05/23  3:08 AM  Result Value Ref Range   WBC 9.1 4.0 - 10.5 K/uL   RBC 3.10 (L) 3.87 - 5.11 MIL/uL   Hemoglobin 8.4 (L) 12.0 - 15.0 g/dL   HCT 33.2 (L) 95.1 - 88.4 %   MCV 86.5 80.0 - 100.0 fL   MCH 27.1 26.0 - 34.0 pg   MCHC 31.3 30.0 - 36.0 g/dL   RDW 16.6 (H) 06.3 - 01.6 %   Platelets 223 150 - 400 K/uL   nRBC 0.5 (H) 0.0 - 0.2 %   Neutrophils Relative % 57 %   Neutro Abs 5.3 1.7 - 7.7 K/uL    Lymphocytes Relative 20 %   Lymphs Abs 1.8 0.7 - 4.0 K/uL   Monocytes Relative 12 %   Monocytes Absolute 1.1 (H) 0.1 - 1.0 K/uL   Eosinophils Relative 4 %   Eosinophils Absolute 0.3 0.0 - 0.5 K/uL   Basophils Relative 1 %   Basophils Absolute 0.1 0.0 - 0.1 K/uL   Immature Granulocytes 6 %   Abs Immature Granulocytes 0.51 (H) 0.00 - 0.07 K/uL  Procalcitonin     Status: None   Collection Time: 02/05/23  3:08 AM  Result Value Ref Range   Procalcitonin 2.17 ng/mL  Glucose, capillary     Status: Abnormal   Collection Time: 02/05/23  5:27 AM  Result Value Ref Range   Glucose-Capillary 117 (H) 70 - 99 mg/dL  Glucose, capillary     Status: Abnormal   Collection Time: 02/05/23 11:41 AM  Result Value Ref Range   Glucose-Capillary 108 (H) 70 - 99 mg/dL    I have reviewed pertinent nursing notes, vitals, labs, and images as necessary. I have ordered labwork to follow up on as indicated.  I have reviewed the last notes from staff over past 24 hours. I have discussed patient's care plan and test results with nursing staff, CM/SW, and other staff as appropriate.  Time spent: Greater than 50% of the 55 minute visit was spent in counseling/coordination of care for the patient as laid out in the A&P.   LOS: 12 days   Lewie Chamber, MD [facility] 02/05/2023, 2:32 PM

## 2023-02-05 NOTE — Progress Notes (Signed)
Pharmacy Antibiotic Note  Tara Berger is a 52 y.o. female admitted on 01/24/2023 with small bowel obstruction. Underwent exploratory laparotomy; lysis of adhesions, resection of ileocolic anastomosis, new ileocolic anastomosis surgery on 6/17. Pharmacy consulted to dose cefepime for sepsis.   Today, 02/05/23 WBC WNL SCr WNL Afebrile  Today is day #5 of IV antibiotics.  Plan: Cefepime 2g IV q8h Metronidazole 500 mg IV q12h Monitor renal function, culture data  Height: 5\' 2"  (157.5 cm) Weight: 62.7 kg (138 lb 3.7 oz) IBW/kg (Calculated) : 50.1  Temp (24hrs), Avg:99.1 F (37.3 C), Min:98.3 F (36.8 C), Max:99.9 F (37.7 C)  Recent Labs  Lab 01/31/23 0118 02/01/23 0353 02/01/23 1156 02/02/23 0414 02/03/23 0553 02/04/23 0254 02/05/23 0308  WBC 10.1 10.3  --  8.6  --  11.0* 9.1  CREATININE 0.51 0.51  --  0.64 0.52 0.50 0.61  LATICACIDVEN  --   --  0.8  --   --   --   --      Estimated Creatinine Clearance: 72.4 mL/min (by C-G formula based on SCr of 0.61 mg/dL).    Allergies  Allergen Reactions   Latex     Antimicrobials this admission: 6/17 Cefotetan x 1 for surgical prophylaxis 6/20 Unasyn x 1 6/20 cefepime and Flagyl >>  6/20 vancomycin >>   Microbiology results: 6/20 BCx: ngtd 6/12 Bcx: ngF 6/19 UCx: multiple species present, suggest recollection  6/20 MRSA PCR: not detected  Cindi Carbon, PharmD 02/05/23 8:06 AM

## 2023-02-05 NOTE — Plan of Care (Signed)
  Problem: Education: Goal: Knowledge of General Education information will improve Description: Including pain rating scale, medication(s)/side effects and non-pharmacologic comfort measures Outcome: Progressing   Problem: Clinical Measurements: Goal: Respiratory complications will improve Outcome: Progressing Goal: Cardiovascular complication will be avoided Outcome: Progressing   Problem: Activity: Goal: Risk for activity intolerance will decrease Outcome: Progressing   Problem: Nutrition: Goal: Adequate nutrition will be maintained Outcome: Progressing   

## 2023-02-05 NOTE — Progress Notes (Signed)
Physical Therapy Treatment Patient Details Name: Tara Berger MRN: [id number] DOB: 1970/09/03 Today's Date: 02/05/2023   History of Present Illness 52 y.o. 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 Dx of SBO. s/p exploratory laparotomy, LOA, resection of iliocolic anastamosis 01/29/23.    PT Comments    AxO x 3 very pleasant Lady and willing to participate.  Also been amb to and from the bathroom with staff.  Tolerated a functional distance.  Required assist to safely navigate walker around obsticles and through doorways.  Able to get in/OOB with increased time and use of rail.   Pt plans to return home with family support.   Recommendations for follow up therapy are one component of a multi-disciplinary discharge planning process, led by the attending physician.  Recommendations may be updated based on patient status, additional functional criteria and insurance authorization.  Follow Up Recommendations       Assistance Recommended at Discharge Set up Supervision/Assistance  Patient can return home with the following A little help with walking and/or transfers;A little help with bathing/dressing/bathroom;Assist for transportation;Help with stairs or ramp for entrance;Assistance with cooking/housework    Equipment Recommendations  Rolling walker (2 wheels)    Recommendations for Other Services       Precautions / Restrictions       Mobility  Bed Mobility Overal bed mobility: Needs Assistance Bed Mobility: Sidelying to Sit, Sit to Sidelying Rolling: Supervision, Min guard Sidelying to sit: Supervision, Min guard     Sit to sidelying: Supervision, Min guard General bed mobility comments: using rail and increased time, pt self able.  Scooting to EOB was most difficult.    Transfers Overall transfer level: Needs assistance Equipment used: Rolling walker (2 wheels) Transfers: Sit to/from Stand Sit to Stand: Supervision, Min guard           General transfer comment: VCs hand placement and increased time to partially rise    Ambulation/Gait Ambulation/Gait assistance: Supervision, Min guard Gait Distance (Feet): 135 Feet Assistive device: Rolling walker (2 wheels) Gait Pattern/deviations: Decreased step length - left, Decreased step length - right, Shuffle, Trunk flexed Gait velocity: decr     General Gait Details: tolerated a functional distance with increased time and need for walker for balance.  Slow but steady.  Required 25%assist to navigate around obsticles and through doorway.  Slightly groggy from pain meds.   Stairs             Wheelchair Mobility    Modified Rankin (Stroke Patients Only)       Balance                                            Cognition Arousal/Alertness: Awake/alert Behavior During Therapy: WFL for tasks assessed/performed Overall Cognitive Status: Within Functional Limits for tasks assessed  General Comments: AxO x 3 very pleasant Lady        Exercises      General Comments        Pertinent Vitals/Pain Pain Assessment Pain Assessment: Faces Faces Pain Scale: Hurts little more Pain Location: abdomen Pain Descriptors / Indicators: Grimacing,  Operative site guarding Pain Intervention(s): Monitored during session, Premedicated before session, Repositioned    Home Living                          Prior Function            PT Goals (current goals can now be found in the care plan section) Progress towards PT goals: Progressing toward goals    Frequency    Min 1X/week      PT Plan Current plan remains appropriate    Co-evaluation              AM-PAC PT "6 Clicks" Mobility   Outcome Measure  Help needed turning from your back to your side while in a flat bed without using bedrails?: A Little Help needed moving from lying on your back to sitting on the side of a flat bed without using bedrails?: A Little Help needed moving to and from a bed to a chair (including a wheelchair)?: A Little Help needed standing up from a chair using your arms (e.g., wheelchair or bedside chair)?: A Little Help needed to walk in hospital room?: A Little Help needed climbing 3-5 steps with a railing? : A Little 6 Click Score: 18    End of Session Equipment Utilized During Treatment: Gait belt Activity Tolerance: Patient tolerated treatment well Patient left: in bed;with bed alarm set;with call bell/phone within reach Nurse Communication: Mobility status PT Visit Diagnosis: Difficulty in walking, not elsewhere classified (R26.2);Pain     Time: 1610-9604 PT Time Calculation (min) (ACUTE ONLY): 19 min  Charges:  $Gait Training: 8-22 mins                     Felecia Shelling  PTA Acute  Rehabilitation Services Office M-F          [phone]

## 2023-02-05 NOTE — Progress Notes (Signed)
PHARMACY - TOTAL PARENTERAL NUTRITION CONSULT NOTE   Indication: Small bowel obstruction  Patient Measurements: Height: 5\' 2"  (157.5 cm) Weight: 62.7 kg (138 lb 3.7 oz) IBW/kg (Calculated) : 50.1 TPN AdjBW (KG): 56.5 Body mass index is 25.28 kg/m.  Assessment:  Pharmacy consulted to start TPN on 52 yo female admitted with abdominal pain, distention, and nausea with episodes of emesis over the past 2 weeks. Extensive history of small bowel stricturing and prior surgeries d/t Crohn's disease. CT shows distal SBO. Recent admission in March 2024 for SBO with NGT & TPN, plans for surgery, but pt left AMA. Returned 6/12 then underwent surgery on 6/17.   Glucose / Insulin: no hx of DM BG goal: <150. Range: 71-117. No SSI/24 hrs. Very sensitive q6h. Electrolytes: Mg (1.6) remains low despite adding back to TPN. All other lytes WNL, including CorrCa (9.5) -K on lower end of normal (K removed from TPN ok 6/21) -Phos on upper end of normal.  Renal: SCr stable, BUN slightly elevated but stable Hepatic: LFTs, Tbili stable WNL; albumin low. Alk Phos elevated - TG WNL (6/24) I/O:  -UOP: 3100 mL + 1 unmeasured -No stool charted. LBM 6/22 -mIVF: NS @ 40 ml/hr  GI Imaging: - 6/13 CTa/p: high-grade distal small-bowel obstruction d/t stricture vs adhesions; no active Crohn's dz noted - 6/15 AXR: persistent SBO GI Surgeries / Procedures:  - 6/17: Ex lap with lysis of adhesions, resection of prior ileocolic anastomosis (which had become strictured), new ileocolic anastomosis. 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  Central access: PICC TPN start date: 6/16   Nutritional Goals: Current TPN formulation (55 g AA, 15% CHO, 30 g/L lipid) at 80 ml/hr provides 105 g protein, 1978 kcal, 1920 mL daily - Goal K >/= 4.0; Mg >/= 2.0 for ileus  RD Assessment: Estimated Needs Total Energy Estimated Needs: 1800-2000 Total Protein Estimated  Needs: 90-105g Total Fluid Estimated Needs: 2L/day  Current Nutrition:  TPN Full liquid diet  Plan:  Now: Magnesium 2 g IV once KCl 10 mEq x 2 runs Reduce CBG checks and SSI from q6h to BID  At 1800: Continue TPN at goal 80 mL/hr Electrolytes in TPN: Increase K, Mg. Decrease Phos. Na 110 mEq/L K 30 mEq/L  Ca 5 mEq/L Mg 6 mEq/L  Phos 6 mmol/L Cl:Ac = 1:2 Add standard MVI and trace elements to TPN MIVF - NS 40 ml/hr; management per MD Continue CBG check and very sensitive SSI BID Monitor TPN labs on Mon/Thurs, recheck electrolytes with AM labs tomorrow.   Cindi Carbon, PharmD 02/05/23 8:36 AM

## 2023-02-06 DIAGNOSIS — K56609 Unspecified intestinal obstruction, unspecified as to partial versus complete obstruction: Secondary | ICD-10-CM | POA: Diagnosis not present

## 2023-02-06 DIAGNOSIS — D62 Acute posthemorrhagic anemia: Secondary | ICD-10-CM | POA: Diagnosis not present

## 2023-02-06 DIAGNOSIS — I10 Essential (primary) hypertension: Secondary | ICD-10-CM | POA: Diagnosis not present

## 2023-02-06 LAB — CBC WITH DIFFERENTIAL/PLATELET
Abs Immature Granulocytes: 0.46 10*3/uL — ABNORMAL HIGH (ref 0.00–0.07)
Basophils Absolute: 0.1 10*3/uL (ref 0.0–0.1)
Basophils Relative: 1 %
Eosinophils Absolute: 0.3 10*3/uL (ref 0.0–0.5)
Eosinophils Relative: 3 %
HCT: 25.9 % — ABNORMAL LOW (ref 36.0–46.0)
Hemoglobin: 8.2 g/dL — ABNORMAL LOW (ref 12.0–15.0)
Immature Granulocytes: 5 %
Lymphocytes Relative: 14 %
Lymphs Abs: 1.3 10*3/uL (ref 0.7–4.0)
MCH: 27.3 pg (ref 26.0–34.0)
MCHC: 31.7 g/dL (ref 30.0–36.0)
MCV: 86.3 fL (ref 80.0–100.0)
Monocytes Absolute: 1 10*3/uL (ref 0.1–1.0)
Monocytes Relative: 10 %
Neutro Abs: 6.2 10*3/uL (ref 1.7–7.7)
Neutrophils Relative %: 67 %
Platelets: 267 10*3/uL (ref 150–400)
RBC: 3 MIL/uL — ABNORMAL LOW (ref 3.87–5.11)
RDW: 18 % — ABNORMAL HIGH (ref 11.5–15.5)
WBC: 9.4 10*3/uL (ref 4.0–10.5)
nRBC: 0 % (ref 0.0–0.2)

## 2023-02-06 LAB — CULTURE, BLOOD (ROUTINE X 2)
Culture: NO GROWTH
Special Requests: ADEQUATE

## 2023-02-06 LAB — GLUCOSE, CAPILLARY
Glucose-Capillary: 122 mg/dL — ABNORMAL HIGH (ref 70–99)
Glucose-Capillary: 130 mg/dL — ABNORMAL HIGH (ref 70–99)
Glucose-Capillary: 122 mg/dL — ABNORMAL HIGH (ref 70–99)

## 2023-02-06 LAB — POTASSIUM: Potassium: 3.7 mmol/L (ref 3.5–5.1)

## 2023-02-06 LAB — MAGNESIUM: Magnesium: 1.7 mg/dL (ref 1.7–2.4)

## 2023-02-06 LAB — PHOSPHORUS: Phosphorus: 3.4 mg/dL (ref 2.5–4.6)

## 2023-02-06 MED ORDER — MELATONIN 5 MG PO TABS
5.0000 mg | ORAL_TABLET | Freq: Once | ORAL | Status: AC
  Administered 2023-02-06: 5 mg via ORAL
  Filled 2023-02-06: qty 1

## 2023-02-06 MED ORDER — POTASSIUM CHLORIDE 20 MEQ PO PACK
40.0000 meq | PACK | Freq: Once | ORAL | Status: AC
  Administered 2023-02-06: 40 meq via ORAL
  Filled 2023-02-06: qty 2

## 2023-02-06 MED ORDER — CARMEX CLASSIC LIP BALM EX OINT
TOPICAL_OINTMENT | CUTANEOUS | Status: DC | PRN
  Filled 2023-02-06: qty 10

## 2023-02-06 MED ORDER — PANTOPRAZOLE SODIUM 40 MG PO TBEC
40.0000 mg | DELAYED_RELEASE_TABLET | Freq: Every day | ORAL | Status: DC
  Administered 2023-02-06 – 2023-02-07 (×2): 40 mg via ORAL
  Filled 2023-02-06 (×2): qty 1

## 2023-02-06 MED ORDER — MAGNESIUM SULFATE 2 GM/50ML IV SOLN
2.0000 g | Freq: Once | INTRAVENOUS | Status: AC
  Administered 2023-02-06: 2 g via INTRAVENOUS
  Filled 2023-02-06: qty 50

## 2023-02-06 MED ORDER — METOPROLOL TARTRATE 50 MG PO TABS
50.0000 mg | ORAL_TABLET | Freq: Two times a day (BID) | ORAL | Status: DC
  Administered 2023-02-06 – 2023-02-07 (×2): 50 mg via ORAL
  Filled 2023-02-06 (×2): qty 1

## 2023-02-06 MED ORDER — TRAVASOL 10 % IV SOLN
INTRAVENOUS | Status: DC
  Filled 2023-02-06: qty 528

## 2023-02-06 NOTE — Plan of Care (Signed)

## 2023-02-06 NOTE — Progress Notes (Signed)
Mobility Specialist - Progress Note   02/06/23 0853  Mobility  Activity Ambulated with assistance in hallway  Level of Assistance Contact guard assist, steadying assist  Assistive Device Front wheel walker  Distance Ambulated (ft) 200 ft  Range of Motion/Exercises Active Assistive  Activity Response Tolerated fair  Mobility Referral Yes  $Mobility charge 1 Mobility  Mobility Specialist Start Time (ACUTE ONLY) [id number]  Mobility Specialist Stop Time (ACUTE ONLY) 0853  Mobility Specialist Time Calculation (min) (ACUTE ONLY) 18 min   Pt was found with RN getting pericare. Agreeable to ambulate afterwards. Pt needs verbal and physical cues on hand placement for STS and during ambulation. Cannot keep a straight path during ambulation. At EOS stated feeling fatigued and wanting to sit but was not near the chair. Had to cue pt to chair and cue her to sit. At EOS was left on recliner chair with chair alarm on. Call bell in reach and RN notified on session.  Billey Chang Mobility Specialist

## 2023-02-06 NOTE — Progress Notes (Signed)
Progress Note    Tara Berger   UKG:[id number]  DOB: 01/31/1971  DOA: 01/24/2023     13 PCP: Pcp, No  Initial CC: abdominal pain  Hospital Course: Tara Berger is a 52 y.o. 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  She was recently admitted last month for SBO requiring NGT placement, TPN with plans to do surgery after 2 weeks of optimization, but signed AMA before general surgery completed her treatment plan.  She returned to the emergency department 01/24/23 with complaints of abdominal distention, abdominal pain, nausea, and multiple episodes of emesis over the prior 2 weeks.   At time of admission, imaging revealed high-grade small bowel obstruction.   Patient was admitted to the hospital and general surgery was consulted.  She eventually underwent ex lap with lysis of adhesions and resection of ileocolic anastomosis and creation of new ileocolic anastomosis on 01/29/23.   Interval History:  No events overnight. She says she had a BM yesterday evening and again this morning. Abd pain remains stable/improved. No N/V either.  She is happy with the progress.   Assessment and Plan: * SBO (small bowel obstruction) (HCC) - CT abdomen/pelvis obtained 01/25/2023 showing high-grade distal small bowel obstruction -Patient followed by general surgery and taken to the OR on 01/29/2023 for ex lap, LOA, resection of old ileocolic anastomosis and formation of new ileocolic anastomosis -Has had expected postop ileus and been on TPN - tolerated CLD followed by soft diet (started 6/24) - she has also achieved a BM both yesterday and this morning  - surgery planning for TPN 1/2 and goal off soon  ABLA (acute blood loss anemia) - s/p 3 units PRBC total (6/18, 6/20,  6/21) - potential reaction on 6/20 but tolerated 6/21 with pre-meds; but if no issue with 6/18 transfusion, then lower suspicion for any reactions  Essential hypertension - No home meds for BP control - given persistent sinus tach, starting on oral lopressor for now  SIRS (systemic inflammatory response syndrome) (HCC)-resolved as of 02/06/2023 - patient with high fever, tachycardia, chills 6/20.  This was roughly into halfway getting a blood transfusion.  Cannot fully rule out transfusion reaction versus sepsis physiology given recent intra-abdominal surgery.  Blood cultures were obtained at that point and she was started on antibiotics.   - CT scan abdomen pelvis without acute findings.  CT angio without PE. - blood cultures remain negative; Urine cx undetermined - mild WBC but could have been reactive and has now remained normal - PCT elevated but WBC normalized and has completed 5 days abx. 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   Malnutrition of moderate degree - RD following as well - has been on TPN; weaning as diet advances   History of CVA (cerebrovascular accident) - chronic left hemiparesis s/p CVA 07/2022  Hypomagnesemia - replete as needed  Tobacco use disorder - Continue nicotine patch   Old records reviewed in assessment of this patient  Antimicrobials:  Cefepime 6/20 >> 02/05/2023 Flagyl 6/20 >> 02/05/2023  DVT prophylaxis:  Place and maintain sequential compression device Start: 02/05/23 0308  Code Status:   Code Status: Full Code  Mobility Assessment (last 72  hours)     Mobility Assessment     Row Name 02/06/23 1212 02/05/23 2030 02/05/23 1600 02/05/23 1318 02/05/23 0825   Does patient have an order for bedrest or is patient medically unstable No - Continue assessment No - Continue assessment -- -- --   What is the highest level of mobility based on the progressive mobility assessment? Level 4 (Walks with assist in room) - Balance while marching in place and cannot step forward and back - Complete Level 4 (Walks with assist in room) - Balance while marching in place and cannot step forward and back - Complete Level 4 (Walks with assist in room) - Balance while marching in place and cannot step forward and back - Complete Level 5 (Walks with assist in room/hall) - Balance while stepping forward/back and can walk in room with assist - Complete Level 3 (Stands with assist) - Balance while standing  and cannot march in place    Row Name 02/04/23 1100 02/03/23 2320 02/03/23 1702       Does patient have an order for bedrest or is patient medically unstable No - Continue assessment No - Continue assessment No - Continue assessment     What is the highest level of mobility based on the progressive mobility assessment? Level 5 (Walks with assist in room/hall) - Balance while stepping forward/back and can walk in room with assist - Complete Level 5 (Walks with assist in room/hall) - Balance while stepping forward/back and can walk in room with assist - Complete Level 5 (Walks with assist in room/hall) - Balance while stepping forward/back and can walk in room with assist - Complete              Barriers to discharge:  Disposition Plan:  TBD Status is: Inpt  Objective: Blood pressure (!) 137/91, pulse (!) 110, temperature 98.2 F (36.8 C), temperature source Oral, resp. rate 18, height 5\' 2"  (1.575 m), weight 64.1 kg, SpO2 100 %.  Examination:  Physical Exam Constitutional:      Appearance: Normal appearance.  HENT:     Head: Normocephalic and  atraumatic.     Mouth/Throat:     Mouth: Mucous membranes are moist.  Eyes:     Extraocular Movements: Extraocular movements intact.  Cardiovascular:     Rate and Rhythm: Regular rhythm. Tachycardia present.  Pulmonary:     Effort: Pulmonary effort is normal.     Breath sounds: Normal breath sounds.  Abdominal:     General: There is no distension.     Palpations: Abdomen is soft.     Comments: Now essentially normal BS in all 4 quadrants; expected TTP; midline incision noted with staples in place  Musculoskeletal:        General: No swelling. Normal range of motion.     Cervical back: Normal range of motion and neck supple.  Skin:    General: Skin is warm and dry.  Neurological:     Mental Status: She is alert. Mental status is at baseline.  Psychiatric:        Mood and Affect: Mood normal.      Consultants:  Surgery  Procedures:  6/17: EXPLORATORY LAPAROTOMY; LYSIS OF ADHESIONS; RESECTION OF ILEOCOLIC ANASTOMOSIS; NEW ILEOCOLIC ANASTOMOSIS  Data Reviewed: Results for orders placed or  performed during the hospital encounter of 01/24/23 (from the past 24 hour(s))  Glucose, capillary     Status: Abnormal   Collection Time: 02/05/23  4:38 PM  Result Value Ref Range   Glucose-Capillary 104 (H) 70 - 99 mg/dL  Glucose, capillary     Status: Abnormal   Collection Time: 02/05/23  6:01 PM  Result Value Ref Range   Glucose-Capillary 100 (H) 70 - 99 mg/dL  Glucose, capillary     Status: Abnormal   Collection Time: 02/05/23 11:55 PM  Result Value Ref Range   Glucose-Capillary 124 (H) 70 - 99 mg/dL  CBC with Differential/Platelet     Status: Abnormal   Collection Time: 02/06/23  3:35 AM  Result Value Ref Range   WBC 9.4 4.0 - 10.5 K/uL   RBC 3.00 (L) 3.87 - 5.11 MIL/uL   Hemoglobin 8.2 (L) 12.0 - 15.0 g/dL   HCT 16.1 (L) 09.6 - 04.5 %   MCV 86.3 80.0 - 100.0 fL   MCH 27.3 26.0 - 34.0 pg   MCHC 31.7 30.0 - 36.0 g/dL   RDW 40.9 (H) 81.1 - 91.4 %   Platelets 267 150 - 400  K/uL   nRBC 0.0 0.0 - 0.2 %   Neutrophils Relative % 67 %   Neutro Abs 6.2 1.7 - 7.7 K/uL   Lymphocytes Relative 14 %   Lymphs Abs 1.3 0.7 - 4.0 K/uL   Monocytes Relative 10 %   Monocytes Absolute 1.0 0.1 - 1.0 K/uL   Eosinophils Relative 3 %   Eosinophils Absolute 0.3 0.0 - 0.5 K/uL   Basophils Relative 1 %   Basophils Absolute 0.1 0.0 - 0.1 K/uL   Immature Granulocytes 5 %   Abs Immature Granulocytes 0.46 (H) 0.00 - 0.07 K/uL  Magnesium     Status: None   Collection Time: 02/06/23  3:35 AM  Result Value Ref Range   Magnesium 1.7 1.7 - 2.4 mg/dL  Phosphorus     Status: None   Collection Time: 02/06/23  3:35 AM  Result Value Ref Range   Phosphorus 3.4 2.5 - 4.6 mg/dL  Potassium     Status: None   Collection Time: 02/06/23  3:35 AM  Result Value Ref Range   Potassium 3.7 3.5 - 5.1 mmol/L  Glucose, capillary     Status: Abnormal   Collection Time: 02/06/23  6:14 AM  Result Value Ref Range   Glucose-Capillary 122 (H) 70 - 99 mg/dL  Glucose, capillary     Status: Abnormal   Collection Time: 02/06/23  7:27 AM  Result Value Ref Range   Glucose-Capillary 130 (H) 70 - 99 mg/dL    I have reviewed pertinent nursing notes, vitals, labs, and images as necessary. I have ordered labwork to follow up on as indicated.  I have reviewed the last notes from staff over past 24 hours. I have discussed patient's care plan and test results with nursing staff, CM/SW, and other staff as appropriate.  Time spent: Greater than 50% of the 55 minute visit was spent in counseling/coordination of care for the patient as laid out in the A&P.   LOS: 13 days   Lewie Chamber, MD [facility] 02/06/2023, 3:42 PM

## 2023-02-06 NOTE — Progress Notes (Signed)
PHARMACY - TOTAL PARENTERAL NUTRITION CONSULT NOTE   Indication: Small bowel obstruction  Patient Measurements: Height: 5\' 2"  (157.5 cm) Weight: 64.1 kg (141 lb 5 oz) IBW/kg (Calculated) : 50.1 TPN AdjBW (KG): 56.5 Body mass index is 25.85 kg/m.  Assessment:  Pharmacy consulted to start TPN on 52 yo female admitted with abdominal pain, distention, and nausea with episodes of emesis over the past 2 weeks. Extensive history of small bowel stricturing and prior surgeries d/t Crohn's disease. CT shows distal SBO. Recent admission in March 2024 for SBO with NGT & TPN, plans for surgery, but pt left AMA. Returned 6/12 then underwent surgery on 6/17.   Glucose / Insulin: no hx of DM BG goal: <150. CBG 122 this AM. No SSI/24 hrs. Very sensitive q6h. Electrolytes: Mg (1.7), K (3.7) WNL after replacement but on lower end of range. Phos WNL. Renal: SCr stable, BUN slightly elevated but stable Hepatic: LFTs, Tbili stable WNL; albumin low. Alk Phos elevated - TG WNL (6/24) I/O:  -UOP: 1400 mL + 3 unmeasured -No stool charted. LBM 6/22 -mIVF: NS @ 40 mL/hr  GI Imaging: - 6/13 CTa/p: high-grade distal small-bowel obstruction d/t stricture vs adhesions; no active Crohn's dz noted - 6/15 AXR: persistent SBO GI Surgeries / Procedures:  - 6/17: Ex lap with lysis of adhesions, resection of prior ileocolic anastomosis (which had become strictured), new ileocolic anastomosis. 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  Central access: PICC TPN start date: 6/16   Nutritional Goals: Current TPN formulation (55 g AA, 15% CHO, 30 g/L lipid) at 80 ml/hr provides 105 g protein, 1978 kcal, 1920 mL daily - Goal K >/= 4.0; Mg >/= 2.0 for ileus  RD Assessment: Estimated Needs Total Energy Estimated Needs: 1800-2000 Total Protein Estimated Needs: 90-105g Total Fluid Estimated Needs: 2L/day  Current Nutrition:  TPN Soft diet  Plan:   Received order from [facility] to reduce TPN to 1/2 rate this evening.  Now: Mg 2 g IV once KCl 40 mEq PO once Discontinue CBG check and SSI as BG controlled with no SSI required at goal rate  At 1800: Reduce TPN to 40 mL/hr (1/2 of goal rate) Electrolytes in TPN: No changes Na 110 mEq/L K 30 mEq/L  Ca 5 mEq/L Mg 6 mEq/L  Phos 6 mmol/L Cl:Ac = 1:2 Add standard MVI and trace elements to TPN MIVF - NS 40 mL/hr; management per MD Monitor TPN labs on Mon/Thurs. Follow for ability to wean off of TPN, potentially tomorrow  Cindi Carbon, PharmD 02/06/23 7:20 AM

## 2023-02-06 NOTE — Progress Notes (Signed)
8 Days Post-Op  Subjective: CC: Some incisional pain that is well controlled with medications. Tolerating diet (ate 100% of dinner). No n/v. BM yesterday. Voiding. Worked with PT yesterday.   Objective: Vital signs in last 24 hours: Temp:  [97.5 F (36.4 C)-99.1 F (37.3 C)] 98.3 F (36.8 C) (06/25 0403) Pulse Rate:  [70-124] 70 (06/25 0403) Resp:  [17-20] 20 (06/25 0403) BP: (133-143)/(90-103) 143/103 (06/25 0403) SpO2:  [96 %-100 %] 97 % (06/25 0403) Weight:  [64.1 kg] 64.1 kg (06/25 0500) Last BM Date : 02/03/23 (per patient)  Intake/Output from previous day: 06/24 0701 - 06/25 0700 In: 2823.2 [P.O.:1080; I.V.:1743.2] Out: 1400 [Urine:1400] Intake/Output this shift: No intake/output data recorded.  PE: Gen:  Alert, NAD, pleasant Abd: Soft, no distension, appropriately tender around laparoscopic incisions, no rigidity or guarding and otherwise NT, +BS. Midline wound appears cdi with staples in place. Somewhat difficult to see as patient is up in chair. Asked RN to let me know when she is back in bed so I can look again.   Lab Results:  Recent Labs    02/05/23 0308 02/06/23 0335  WBC 9.1 9.4  HGB 8.4* 8.2*  HCT 26.8* 25.9*  PLT 223 267   BMET Recent Labs    02/04/23 0254 02/05/23 0308 02/06/23 0335  NA 137 140  --   K 3.7 3.6 3.7  CL 104 106  --   CO2 26 27  --   GLUCOSE 109* 110*  --   BUN 25* 26*  --   CREATININE 0.50 0.61  --   CALCIUM 7.8* 7.8*  --    PT/INR No results for input(s): "LABPROT", "INR" in the last 72 hours. CMP     Component Value Date/Time   NA 140 02/05/2023 0308   K 3.7 02/06/2023 0335   CL 106 02/05/2023 0308   CO2 27 02/05/2023 0308   GLUCOSE 110 (H) 02/05/2023 0308   BUN 26 (H) 02/05/2023 0308   CREATININE 0.61 02/05/2023 0308   CALCIUM 7.8 (L) 02/05/2023 0308   PROT 4.8 (L) 02/05/2023 0308   ALBUMIN 1.9 (L) 02/05/2023 0308   AST 26 02/05/2023 0308   ALT 35 02/05/2023 0308   ALKPHOS 169 (H) 02/05/2023 0308    BILITOT 0.5 02/05/2023 0308   GFRNONAA >60 02/05/2023 0308   GFRAA  06/15/2010 0447    >60        The eGFR has been calculated using the MDRD equation. This calculation has not been validated in all clinical situations. eGFR's persistently <60 mL/min signify possible Chronic Kidney Disease.   Lipase     Component Value Date/Time   LIPASE 22 01/24/2023 1240    Studies/Results: No results found.  Anti-infectives: Anti-infectives (From admission, onward)    Start     Dose/Rate Route Frequency Ordered Stop   02/02/23 1000  vancomycin (VANCOREADY) IVPB 1250 mg/250 mL  Status:  Discontinued        1,250 mg 166.7 mL/hr over 90 Minutes Intravenous Every 24 hours 02/01/23 1246 02/02/23 1216   02/01/23 1200  ceFEPIme (MAXIPIME) 2 g in sodium chloride 0.9 % 100 mL IVPB  Status:  Discontinued        2 g 200 mL/hr over 30 Minutes Intravenous Every 8 hours 02/01/23 1106 02/05/23 1432   02/01/23 1100  vancomycin (VANCOREADY) IVPB 1250 mg/250 mL        1,250 mg 166.7 mL/hr over 90 Minutes Intravenous NOW 02/01/23 1001 02/01/23 1148   02/01/23 1015  metroNIDAZOLE (FLAGYL) IVPB 500 mg  Status:  Discontinued        500 mg 100 mL/hr over 60 Minutes Intravenous 2 times daily 02/01/23 1009 02/05/23 1432   02/01/23 0830  Ampicillin-Sulbactam (UNASYN) 3 g in sodium chloride 0.9 % 100 mL IVPB  Status:  Discontinued        3 g 200 mL/hr over 30 Minutes Intravenous Every 6 hours 02/01/23 0744 02/01/23 1010   01/29/23 0800  cefoTEtan (CEFOTAN) 2 g in sodium chloride 0.9 % 100 mL IVPB        2 g 200 mL/hr over 30 Minutes Intravenous On call to O.R. 01/28/23 0815 01/29/23 1301        Path  FINAL MICROSCOPIC DIAGNOSIS:   A. SMALL BOWEL, ANASTOMOTIC STRICTURE, RESECTION:  Fibromuscular hypertrophy with adhesions and proximal dilatation  consistent with anastomotic stricture.  Negative for malignancy.    Assessment/Plan POD 8 s/p ex lap, loa, resection of ileocolic anastomosis with new  ileocolic anastomosis for sbo 2/2 strictured ileocolic anastomosis by Dr. Magnus Ivan 01/29/23 - Path benign - CT 6/20 reassuring with no evidence of abscess or leak.  - Tolerating soft diet and having bowel function. 1/2 TPN today.  - Ambulate, PT. Currently rec for [facility] PT.  - Pulm toilet.  ABL anemia - hgb now stable. No hematochezia or melena reported   FEN - Soft diet. 1/2 TPN, IVF per [facility] VTE - SCDs, anticoagulation on hold for abl anemia and thrombocytopenia - now improved/stable. Will discuss with MD if able to restart.  ID - No current abx. Afebrile. WBC wnl Foley - Out, voiding.  Plan - As above.    - Per [facility] -  Hx Crohn's - Per GI. Per note, has plans to start biologic therapy in outpatient setting with primary GI team (through [facility]) Hx HTN Hx CVA w/ Chronic left hemiparesis  Hx PE/DVT - Per notes, dx in Feb this year. [facility] plans to tx when ABL anemia stable/improved. Will discuss with MD if okay to start   LOS: 13 days    [name] , [facility] Surgery 02/06/2023, 9:43 AM Please see Amion for pager number during day hours 7:00am-4:30pm

## 2023-02-07 ENCOUNTER — Other Ambulatory Visit (HOSPITAL_COMMUNITY): Payer: Self-pay

## 2023-02-07 DIAGNOSIS — K56609 Unspecified intestinal obstruction, unspecified as to partial versus complete obstruction: Secondary | ICD-10-CM | POA: Diagnosis not present

## 2023-02-07 LAB — CBC WITH DIFFERENTIAL/PLATELET
Abs Immature Granulocytes: 0.36 10*3/uL — ABNORMAL HIGH (ref 0.00–0.07)
Basophils Absolute: 0 10*3/uL (ref 0.0–0.1)
Basophils Relative: 0 %
Eosinophils Absolute: 0.4 10*3/uL (ref 0.0–0.5)
Eosinophils Relative: 4 %
HCT: 24.8 % — ABNORMAL LOW (ref 36.0–46.0)
Hemoglobin: 7.7 g/dL — ABNORMAL LOW (ref 12.0–15.0)
Immature Granulocytes: 4 %
Lymphocytes Relative: 17 %
Lymphs Abs: 1.4 10*3/uL (ref 0.7–4.0)
MCH: 27 pg (ref 26.0–34.0)
MCHC: 31 g/dL (ref 30.0–36.0)
MCV: 87 fL (ref 80.0–100.0)
Monocytes Absolute: 0.8 10*3/uL (ref 0.1–1.0)
Monocytes Relative: 9 %
Neutro Abs: 5.7 10*3/uL (ref 1.7–7.7)
Neutrophils Relative %: 66 %
Platelets: 281 10*3/uL (ref 150–400)
RBC: 2.85 MIL/uL — ABNORMAL LOW (ref 3.87–5.11)
RDW: 18.7 % — ABNORMAL HIGH (ref 11.5–15.5)
WBC: 8.7 10*3/uL (ref 4.0–10.5)
nRBC: 0 % (ref 0.0–0.2)

## 2023-02-07 LAB — IRON AND TIBC
Iron: 40 ug/dL (ref 28–170)
Saturation Ratios: 20 % (ref 10.4–31.8)
TIBC: 203 ug/dL — ABNORMAL LOW (ref 250–450)
UIBC: 163 ug/dL

## 2023-02-07 LAB — FERRITIN: Ferritin: 242 ng/mL (ref 11–307)

## 2023-02-07 MED ORDER — POLYETHYLENE GLYCOL 3350 17 GM/SCOOP PO POWD
17.0000 g | Freq: Two times a day (BID) | ORAL | 0 refills | Status: AC
  Filled 2023-02-07: qty 238, 7d supply, fill #0

## 2023-02-07 MED ORDER — POLYETHYLENE GLYCOL 3350 17 G PO PACK: 17.0000 g | PACK | Freq: Two times a day (BID) | ORAL | Status: DC

## 2023-02-07 MED ORDER — METHOCARBAMOL 750 MG PO TABS
750.0000 mg | ORAL_TABLET | Freq: Three times a day (TID) | ORAL | 0 refills | Status: AC | PRN
  Filled 2023-02-07: qty 30, 10d supply, fill #0

## 2023-02-07 MED ORDER — METOPROLOL TARTRATE 50 MG PO TABS
50.0000 mg | ORAL_TABLET | Freq: Two times a day (BID) | ORAL | 0 refills | Status: AC
  Filled 2023-02-07: qty 60, 30d supply, fill #0

## 2023-02-07 MED ORDER — OXYCODONE HCL 5 MG PO TABS
5.0000 mg | ORAL_TABLET | Freq: Four times a day (QID) | ORAL | 0 refills | Status: DC | PRN
  Filled 2023-02-07: qty 12, 3d supply, fill #0

## 2023-02-07 MED ORDER — SENNA 8.6 MG PO TABS
1.0000 | ORAL_TABLET | Freq: Every day | ORAL | 0 refills | Status: AC
  Filled 2023-02-07: qty 30, 30d supply, fill #0

## 2023-02-07 MED ORDER — SENNA 8.6 MG PO TABS: 1.0000 | ORAL_TABLET | Freq: Every day | ORAL | Status: DC

## 2023-02-07 MED ORDER — KATE FARMS STANDARD 1.4 PO LIQD
325.0000 mL | Freq: Two times a day (BID) | ORAL | Status: DC
  Filled 2023-02-07: qty 325

## 2023-02-07 NOTE — Progress Notes (Signed)
Physical Therapy Treatment Patient Details Name: Tara Berger MRN: [id number] DOB: [birth year] Today's Date: 02/07/2023   History of Present Illness 52 y.o. 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 Dx of SBO. s/p exploratory laparotomy, LOA, resection of iliocolic anastamosis 01/29/23.    PT Comments    Pt agreeable to working with therapy. She reports 7/10 abd pain with activity. Cues for safety throughout session. Assisted pt back to bed at end of session. Continue to recommend rehab after this hospital stay.    Recommendations for follow up therapy are one component of a multi-disciplinary discharge planning process, led by the attending physician.  Recommendations may be updated based on patient status, additional functional criteria and insurance authorization.  Follow Up Recommendations       Assistance Recommended at Discharge Intermittent Supervision/Assistance  Patient can return home with the following A little help with walking and/or transfers;A little help with bathing/dressing/bathroom;Assist for transportation;Help with stairs or ramp for entrance;Assistance with cooking/housework   Equipment Recommendations  Rolling walker (2 wheels)    Recommendations for Other Services       Precautions  / Restrictions Precautions Precaution Comments: abdominal surgery Restrictions Weight Bearing Restrictions: No     Mobility  Bed Mobility Overal bed mobility: Needs Assistance Bed Mobility: Supine to Sit, Sit to Supine     Supine to sit: Min guard, HOB elevated Sit to supine: Min assist   General bed mobility comments: using rail and increased time, pt self able.  Scooting to EOB was most difficult. Assist for LEs back onto bed.    Transfers Overall transfer level: Needs assistance Equipment used: Rolling walker (2 wheels) Transfers: Sit to/from Stand Sit to Stand: Min assist           General transfer comment: Cues for safety, hand placement. Assist to power up, steady.    Ambulation/Gait Ambulation/Gait assistance: Min assist Gait Distance (Feet): 225 Feet Assistive device: Rolling walker (2 wheels) Gait Pattern/deviations: Step-through pattern, Decreased stride length       General Gait Details: Intermittent Min A to steady pt and manage RW. Cues for safety, posture, RW proximity, pacing, and for pt to increase step lengths bilaterally. Dyspnea 2/4 after walk.   Stairs             Wheelchair Mobility    Modified Rankin (Stroke Patients Only)       Balance Overall balance assessment: Needs assistance         Standing balance support: During functional activity, Reliant on assistive device for balance, Bilateral upper extremity supported Standing balance-Leahy Scale: Poor                              Cognition Arousal/Alertness: Awake/alert Behavior During Therapy: WFL for tasks assessed/performed Overall Cognitive Status: Within Functional Limits for tasks assessed  General Comments: mostly WFL-some delayed responses intermittently        Exercises      General Comments        Pertinent Vitals/Pain Pain Assessment Pain Assessment: 0-10 Pain Score: 7  Pain Location:  abdomen Pain Descriptors / Indicators: Grimacing, Operative site guarding Pain Intervention(s): Limited activity within patient's tolerance, Monitored during session, Repositioned    Home Living                          Prior Function            PT Goals (current goals can now be found in the care plan section) Progress towards PT goals: Progressing toward goals    Frequency    Min 1X/week      PT Plan Current plan remains appropriate    Co-evaluation              AM-PAC PT "6 Clicks" Mobility   Outcome Measure  Help needed turning from your back to your side while in a flat bed without using bedrails?: A Little Help needed moving from lying on your back to sitting on the side of a flat bed without using bedrails?: A Little Help needed moving to and from a bed to a chair (including a wheelchair)?: A Little Help needed standing up from a chair using your arms (e.g., wheelchair or bedside chair)?: A Little Help needed to walk in hospital room?: A Little Help needed climbing 3-5 steps with a railing? : A Little 6 Click Score: 18    End of Session Equipment Utilized During Treatment: Gait belt Activity Tolerance: Patient tolerated treatment well Patient left: in bed;with call bell/phone within reach;with bed alarm set   PT Visit Diagnosis: Other abnormalities of gait and mobility (R26.89);Pain Pain - part of body:  (abdomen)     Time: 1610-9604 PT Time Calculation (min) (ACUTE ONLY): 23 min  Charges:  $Gait Training: 23-37 mins                         Faye Ramsay, PT Acute Rehabilitation  Office: [phone]

## 2023-02-07 NOTE — Progress Notes (Signed)
AVS and discharge instructions reviewed w/ patient. Patient verbalized understanding. Medications brought up by pharmacy before discharge. Patient discharged via family member.

## 2023-02-07 NOTE — Discharge Summary (Addendum)
Physician Discharge Summary  Patient: Tara Berger ZOX:[id number] DOB: 1971-08-08   Code Status: Full Code Admit date: 01/24/2023 Discharge date: 02/07/2023 Disposition: Home health, PT and OT PCP: Pcp, No  Recommendations for Outpatient Follow-up:  Follow up with PCP within 1-2 weeks Regarding general hospital follow up and preventative care Recommend CBC to endure hgb stable and restart eliquis as appropriate Follow up with general surgery Regarding post-op monitoring and staple removal  Discharge Diagnoses:  Principal Problem:   SBO (small bowel obstruction) (HCC) Active Problems:   ABLA (acute blood loss anemia)   Essential hypertension   History of pulmonary embolism   Crohn's disease (HCC)   Malnutrition of moderate degree   Left hemiparesis (HCC)   Tobacco use disorder   Hypomagnesemia   History of CVA (cerebrovascular accident)  Brief Hospital Course Summary: Tara Berger is a 52 y.o. 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  She was recently admitted last month for SBO requiring NGT placement, TPN with plans to do surgery after 2 weeks of optimization, but signed AMA before general surgery completed her treatment plan.  She returned to the emergency department 01/24/23 with complaints of abdominal distention, abdominal pain, nausea, and multiple episodes of emesis over the prior 2 weeks.   At time of admission, imaging revealed high-grade small bowel obstruction.   Patient was admitted to the hospital and general surgery was consulted.  She eventually underwent ex lap with lysis of adhesions and resection of ileocolic anastomosis and creation of new ileocolic anastomosis on 01/29/23. Was treated with cefepime and flagyl but had negative  blood cultures.   Post-op recovery was gradual due to suspected post-op ileus. She was able to gradually increase oral intake starting with CLD 6/24 and weaned from TPN on 6/26. She had several soft BMs Prior to dc.   She received 3 units total of pRBCs in post-op period and then hgb remained stable. Hgb 7.7 on day of discharge.  General surgery evaluated her incision and recommended staple removal at follow up visit. cleared her for dc.  She was discharged with pain regimen and bowel regimen.  Her home eliquis from prior PE was held at discharge until her follow up.   Discharge Condition: Stable, improved Recommended discharge diet: Regular healthy diet  Consultations: General surgery GI  Procedures/Studies: Ex lap, bowel resection TPN  Discharge Instructions     Discharge patient   Complete by: As directed    Discharge disposition: 06-Home-Health Care Svc   Discharge patient date: 02/07/2023      Allergies as of 02/07/2023       Reactions   Latex         Medication List     STOP taking these medications    apixaban 5 MG Tabs tablet Commonly known as: ELIQUIS   aspirin 81 MG chewable tablet       TAKE these medications    methocarbamol 750 MG tablet Commonly known as: ROBAXIN Take 1 tablet (750 mg total) by mouth every 8 (eight) hours as needed for muscle spasms.   metoprolol tartrate 50 MG tablet Commonly known as: LOPRESSOR Take 1 tablet (50 mg total) by mouth 2 (two) times daily.   mirtazapine 15 MG tablet Commonly known as: REMERON Take 15 mg by mouth at bedtime.   oxyCODONE 5 MG immediate release tablet  Commonly known as: Oxy IR/ROXICODONE Take 1 tablet (5 mg total) by mouth every 6 (six) hours as needed for up to 3 days for severe pain or moderate pain (5mg  for moderate pain, 10mg  for severe pain).   polyethylene glycol 17 g packet Commonly known as: MIRALAX / GLYCOLAX Take 17 g by mouth 2 (two) times daily.   senna 8.6 MG Tabs  tablet Commonly known as: SENOKOT Take 1 tablet (8.6 mg total) by mouth daily.               Durable Medical Equipment  (From admission, onward)           Start     Ordered   01/30/23 0852  For home use only DME Walker rolling  Once       Question Answer Comment  Walker: With 5 Inch Wheels   Patient needs a walker to treat with the following condition Difficulty in walking, not elsewhere classified      01/30/23 0851            Follow-up Information     [facility] Follow up.   Why: [facility] will provide PT in the home after discharge.        [facility], [place]. Go on 02/12/2023.   Specialty: General Surgery Why: Nurse visit for staple removal 7/1 at 10:00 am Please arrive 30 minutes early to complete check in, and bring photo ID and insurance card. Contact information: [address] [place] [address] [phone]        Abigail Miyamoto, MD. Go on 02/28/2023.   Specialty: General Surgery Why: 7/17 at 2:50 pm Please arrive 15 minutes early to complete check in, and bring photo ID and insurance card. Contact information: [address] [place] [address] [phone]                 Subjective   Pt reports feeling OK. She has been tolerating her liquid diet. States her last BM was yesterday. Her pain is moderately well controlled on tylenol. She endorses good support at home and wants to be discharged today.   All questions and concerns were addressed at time of discharge.  Objective  Blood pressure (!) 144/93, pulse 97, temperature 98.3 F (36.8 C), temperature source Oral, resp. rate 16, height 5\' 2"  (1.575 m), weight 63.9 kg, SpO2 97 %.   General: Pt is alert, awake, not in acute distress Cardiovascular: RRR, S1/S2 +, no rubs, no gallops Respiratory: CTA bilaterally, no wheezing, no rhonchi Abdominal: Soft, NT, ND, bowel sounds +. Vertical incision healing well with  staples in place. Exception is that there is about 2cm dehiscence at umbilicus area. No signs of drainage or infection. Addressed this with general surgery PA who did not recommend changes to incision follow up plan Extremities: no edema, no cyanosis  The results of significant diagnostics from this hospitalization (including imaging, microbiology, ancillary and laboratory) are listed below for reference.   Imaging studies: CT Angio Chest Pulmonary Embolism (PE) W or WO Contrast  Result Date: 02/01/2023 CLINICAL DATA:  Recent surgery for small bowel obstruction. Postoperative abdominal pain. EXAM: CT ANGIOGRAPHY CHEST CT ABDOMEN AND PELVIS WITH CONTRAST TECHNIQUE: Multidetector CT imaging of the chest was performed using the standard protocol during bolus administration of intravenous contrast. Multiplanar CT image reconstructions and MIPs were obtained to evaluate the vascular anatomy. Multidetector CT imaging of the abdomen and pelvis was performed using the standard protocol  during bolus administration of intravenous contrast. RADIATION DOSE REDUCTION: This exam was performed according to the departmental dose-optimization program which includes automated exposure control, adjustment of the mA and/or kV according to patient size and/or use of iterative reconstruction technique. CONTRAST:  OMNIPAQUE IOHEXOL 300 MG/ML  SOLN COMPARISON:  CT abdomen/pelvis 01/25/2023 FINDINGS: CTA CHEST FINDINGS Cardiovascular: The heart is normal in size. No pericardial effusion. The aorta is normal in caliber. No dissection. Scattered atherosclerotic calcifications at the aortic arch. Age advanced three-vessel coronary artery calcifications are noted. The pulmonary arterial tree is well opacified. No filling defects to suggest pulmonary embolism. Mild enlargement of the pulmonary arteries may suggest pulmonary hypertension. Mediastinum/Nodes: No mediastinal or hilar mass or lymphadenopathy. The esophagus is grossly.  The left subclavian central venous catheter is in good position. Lungs/Pleura: Small bilateral pleural effusions, right larger than left with areas of overlying subsegmental atelectasis. Underlying emphysematous changes and pulmonary scarring. Centrilobular upper lobe nodularity greater on the right likely suggesting respiratory bronchiolitis. Musculoskeletal: No significant bony findings. Review of the MIP images confirms the above findings. CT ABDOMEN and PELVIS FINDINGS Hepatobiliary: Mild periportal edema and a small amount fluid around the gallbladder likely a postsurgical change. No hepatic lesions or intrahepatic biliary dilatation. The portal and hepatic veins are patent. Pancreas: Normal Spleen: Normal Adrenals/Urinary Tract: Normal Stomach/Bowel: The stomach and duodenum are unremarkable. The proximal and mid small bowel loops are normal in caliber. Slight dilatation of the more distal small bowel loops but no findings for recurrent obstruction. Contrast extends all the way into the colon. Surgical changes at the ileocolonic anastomosis. Expected inflammations/edema and scattered surrounding fluid but no findings suspicious for a postoperative abscess. No leaking oral contrast is identified. One small dot of air near the anastomosis is not unexpected. Vascular/Lymphatic: The aorta and branch vessels are patent. The major venous structures are patent. Stable IVC filter. No mesenteric or retroperitoneal mass or adenopathy. Reproductive: The uterus and ovaries are unremarkable. Other: Small amount of free abdominal and free pelvic fluid, not unexpected after surgery. No hematoma or rim enhancing abscess. Musculoskeletal: No significant bony findings. Review of the MIP images confirms the above findings. IMPRESSION: 1. No CT findings for pulmonary embolism. 2. Small bilateral pleural effusions, right larger than left with areas of overlying subsegmental atelectasis. 3. Underlying emphysematous changes and  pulmonary scarring. 4. Centrilobular upper lobe nodularity greater on the right likely suggesting respiratory bronchiolitis. 5. Surgical changes at the ileocolonic anastomosis. Expected inflammation/edema and scattered surrounding fluid but no findings suspicious for a postoperative abscess. No leaking oral contrast is identified. 6. Small amount of free abdominal and free pelvic fluid, not unexpected after surgery. 7. Aortic atherosclerosis. Aortic Atherosclerosis (ICD10-I70.0) and Emphysema (ICD10-J43.9). But Electronically Signed   By: Rudie Meyer M.D.   On: 02/01/2023 12:09   CT ABDOMEN PELVIS W CONTRAST  Result Date: 02/01/2023 CLINICAL DATA:  Recent surgery for small bowel obstruction. Postoperative abdominal pain. EXAM: CT ANGIOGRAPHY CHEST CT ABDOMEN AND PELVIS WITH CONTRAST TECHNIQUE: Multidetector CT imaging of the chest was performed using the standard protocol during bolus administration of intravenous contrast. Multiplanar CT image reconstructions and MIPs were obtained to evaluate the vascular anatomy. Multidetector CT imaging of the abdomen and pelvis was performed using the standard protocol during bolus administration of intravenous contrast. RADIATION DOSE REDUCTION: This exam was performed according to the departmental dose-optimization program which includes automated exposure control, adjustment of the mA and/or kV according to patient size and/or use of iterative reconstruction technique. CONTRAST:  OMNIPAQUE IOHEXOL 300 MG/ML  SOLN COMPARISON:  CT abdomen/pelvis 01/25/2023 FINDINGS: CTA CHEST FINDINGS Cardiovascular: The heart is normal in size. No pericardial effusion. The aorta is normal in caliber. No dissection. Scattered atherosclerotic calcifications at the aortic arch. Age advanced three-vessel coronary artery calcifications are noted. The pulmonary arterial tree is well opacified. No filling defects to suggest pulmonary embolism. Mild enlargement of the pulmonary arteries  may suggest pulmonary hypertension. Mediastinum/Nodes: No mediastinal or hilar mass or lymphadenopathy. The esophagus is grossly. The left subclavian central venous catheter is in good position. Lungs/Pleura: Small bilateral pleural effusions, right larger than left with areas of overlying subsegmental atelectasis. Underlying emphysematous changes and pulmonary scarring. Centrilobular upper lobe nodularity greater on the right likely suggesting respiratory bronchiolitis. Musculoskeletal: No significant bony findings. Review of the MIP images confirms the above findings. CT ABDOMEN and PELVIS FINDINGS Hepatobiliary: Mild periportal edema and a small amount fluid around the gallbladder likely a postsurgical change. No hepatic lesions or intrahepatic biliary dilatation. The portal and hepatic veins are patent. Pancreas: Normal Spleen: Normal Adrenals/Urinary Tract: Normal Stomach/Bowel: The stomach and duodenum are unremarkable. The proximal and mid small bowel loops are normal in caliber. Slight dilatation of the more distal small bowel loops but no findings for recurrent obstruction. Contrast extends all the way into the colon. Surgical changes at the ileocolonic anastomosis. Expected inflammations/edema and scattered surrounding fluid but no findings suspicious for a postoperative abscess. No leaking oral contrast is identified. One small dot of air near the anastomosis is not unexpected. Vascular/Lymphatic: The aorta and branch vessels are patent. The major venous structures are patent. Stable IVC filter. No mesenteric or retroperitoneal mass or adenopathy. Reproductive: The uterus and ovaries are unremarkable. Other: Small amount of free abdominal and free pelvic fluid, not unexpected after surgery. No hematoma or rim enhancing abscess. Musculoskeletal: No significant bony findings. Review of the MIP images confirms the above findings. IMPRESSION: 1. No CT findings for pulmonary embolism. 2. Small bilateral  pleural effusions, right larger than left with areas of overlying subsegmental atelectasis. 3. Underlying emphysematous changes and pulmonary scarring. 4. Centrilobular upper lobe nodularity greater on the right likely suggesting respiratory bronchiolitis. 5. Surgical changes at the ileocolonic anastomosis. Expected inflammation/edema and scattered surrounding fluid but no findings suspicious for a postoperative abscess. No leaking oral contrast is identified. 6. Small amount of free abdominal and free pelvic fluid, not unexpected after surgery. 7. Aortic atherosclerosis. Aortic Atherosclerosis (ICD10-I70.0) and Emphysema (ICD10-J43.9). But Electronically Signed   By: Rudie Meyer M.D.   On: 02/01/2023 12:09   DG CHEST PORT 1 VIEW  Result Date: 02/01/2023 CLINICAL DATA:  Wheezing. EXAM: PORTABLE CHEST 1 VIEW COMPARISON:  January 31, 2023. FINDINGS: Stable cardiomediastinal silhouette. Left-sided PICC line is noted with tip in expected position of cavoatrial junction. Minimal right basilar subsegmental atelectasis is noted. Stable left midlung scarring or atelectasis is noted with associated pleural thickening. Stable left basilar atelectasis or scarring is noted with small left pleural effusion or scarring. Bony thorax is unremarkable. IMPRESSION: Stable findings seen involving left lung. Minimal right basilar subsegmental atelectasis is noted. Electronically Signed   By: Lupita Raider M.D.   On: 02/01/2023 11:46   CT HEAD WO CONTRAST ( )  Result Date: 01/31/2023 CLINICAL DATA:  Altered mental status EXAM: CT HEAD WITHOUT CONTRAST TECHNIQUE: Contiguous axial images were obtained from the base of the skull through the vertex without intravenous contrast. RADIATION DOSE REDUCTION: This exam was performed according to the departmental dose-optimization program which  includes automated exposure control, adjustment of the mA and/or kV according to patient size and/or use of iterative reconstruction technique.  COMPARISON:  None Available. FINDINGS: Brain: There is no mass, hemorrhage or extra-axial collection. There is generalized atrophy without lobar predilection. Hypodensity of the white matter is most commonly associated with chronic microvascular disease. There are multiple old bilateral cortical infarcts. Vascular: No abnormal hyperdensity of the major intracranial arteries or dural venous sinuses. No intracranial atherosclerosis. Skull: The visualized skull base, calvarium and extracranial soft tissues are normal. Sinuses/Orbits: No fluid levels or advanced mucosal thickening of the visualized paranasal sinuses. No mastoid or middle ear effusion. The orbits are normal. IMPRESSION: 1. No acute intracranial abnormality. 2. Multiple old bilateral cortical infarcts. 3. Generalized atrophy and chronic microvascular disease. Electronically Signed   By: Deatra Robinson M.D.   On: 01/31/2023 19:31   DG CHEST PORT 1 VIEW  Result Date: 01/31/2023 CLINICAL DATA:  Cough EXAM: PORTABLE CHEST 1 VIEW COMPARISON:  X-ray 01/24/2023 FINDINGS: Left-sided PICC catheter with tip overlying the upper right atrium. Increasing hazy left midlung opacity with a small left effusion or pleural thickening. Right lung has some basilar atelectasis. No pneumothorax. No separate consolidation in the right lung. Stable cardiopericardial silhouette. IMPRESSION: Left-sided PICC. Increasing left midlung opacity.  Right basilar atelectasis. Tiny left effusion versus pleural thickening Electronically Signed   By: Karen Kays M.D.   On: 01/31/2023 17:08   DG Abd 1 View  Result Date: 01/29/2023 CLINICAL DATA:  NG tube placement. Small bowel obstruction status post exploratory laparotomy and bowel resection today. EXAM: ABDOMEN - 1 VIEW COMPARISON:  Abdominal radiograph 01/27/2023 FINDINGS: An enteric tube has been placed and terminates in the expected region of the gastric body with side port also below the diaphragm. New intraperitoneal free air is  consistent with recent surgery. Small bowel dilatation included portion of the abdomen is decreased from prior study. An IVC filter and skin staples are noted. There are small left and possible trace right pleural effusions. IMPRESSION: 1. Enteric tube in the stomach. 2. Pneumoperitoneum consistent with recent surgery. Electronically Signed   By: Sebastian Ache M.D.   On: 01/29/2023 16:25   [place] EKG SITE RITE  Result Date: 01/28/2023 If Site Rite image not attached, placement could not be confirmed due to current cardiac rhythm.  DG Abd Portable 1V  Result Date: 01/27/2023 CLINICAL DATA:  Small-bowel obstruction EXAM: PORTABLE ABDOMEN - 1 VIEW COMPARISON:  CT abdomen pelvis 01/25/2023 FINDINGS: Redemonstrated markedly gaseous distended loops small bowel within the central abdomen measuring up to 5 cm. No definite free intraperitoneal air. Oral contrast material within the colon. IVC filter. Lumbar spine degenerative changes. Left nephrolithiasis. IMPRESSION: Marked gaseous distended loops of small bowel within the central abdomen measuring up to 5 cm, compatible with small-bowel obstruction. Electronically Signed   By: Annia Belt M.D.   On: 01/27/2023 08:16   CT ABDOMEN PELVIS W CONTRAST  Result Date: 01/25/2023 CLINICAL DATA:  Crohn's exacerbation. Small bowel obstruction seen on abdominal series. EXAM: CT ABDOMEN AND PELVIS WITH CONTRAST TECHNIQUE: Multidetector CT imaging of the abdomen and pelvis was performed using the standard protocol following bolus administration of intravenous contrast. RADIATION DOSE REDUCTION: This exam was performed according to the departmental dose-optimization program which includes automated exposure control, adjustment of the mA and/or kV according to patient size and/or use of iterative reconstruction technique. CONTRAST:  OMNIPAQUE IOHEXOL 300 MG/ML  SOLN COMPARISON:  Plain films 01/24/2023 FINDINGS: Lower chest: No acute abnormality.  Hepatobiliary: Diffuse  low-density throughout the liver compatible with fatty infiltration. No focal abnormality. Gallbladder unremarkable. Pancreas: No focal abnormality or ductal dilatation. Spleen: No focal abnormality.  Normal size. Adrenals/Urinary Tract: No adrenal abnormality. No focal renal abnormality. No stones or hydronephrosis. Urinary bladder is unremarkable. Stomach/Bowel: Postoperative changes in the right lower quadrant. Markedly dilated small bowel into the pelvis. Distal small bowel is decompressed and grossly unremarkable. No evidence for active Crohn's disease. Findings compatible with distal high-grade small bowel obstruction, possibly related to stricture or adhesions. Stomach unremarkable. Vascular/Lymphatic: Aortic atherosclerosis. No evidence of aneurysm or adenopathy. IVC filter in place. Reproductive: Uterus and adnexa unremarkable.  No mass. Other: No free fluid or free air. Musculoskeletal: No acute bony abnormality. IMPRESSION: High-grade distal small-bowel obstruction. Distal ileum is decompressed and grossly unremarkable. No evidence for active Crohn's disease. Obstruction may be related to stricture or adhesions. Fatty liver. Aortic atherosclerosis. Electronically Signed   By: Charlett Nose M.D.   On: 01/25/2023 00:29   DG ABD ACUTE 2+V W 1V CHEST  Result Date: 01/24/2023 CLINICAL DATA:  Crohn disease, recent obstruction. Abdominal pain and vomiting. EXAM: DG ABDOMEN ACUTE WITH 1 VIEW CHEST COMPARISON:  06/13/2010. FINDINGS: Frontal view of the chest shows midline trachea and normal heart size. Pleuroparenchymal scarring in the left hemithorax, new from 06/13/2010. Lungs are otherwise clear. Markedly distended loops of small bowel in the upper abdomen with associated air-fluid levels. No colonic gas. Sutures are seen in the right lower quadrant. IVC filter in place. No free air. IMPRESSION: 1. High-grade small bowel obstruction.  No free air. 2. Pleuroparenchymal scarring in the left hemithorax.  Electronically Signed   By: Leanna Battles M.D.   On: 01/24/2023 13:01    Labs: Basic Metabolic Panel: Recent Labs  Lab 02/01/23 0353 02/02/23 0414 02/03/23 0553 02/04/23 0254 02/05/23 0308 02/06/23 0335  NA 138 139 134* 137 140  --   K 4.3 5.2* 4.8 3.7 3.6 3.7  CL 106 105 104 104 106  --   CO2 29 27 24 26 27   --   GLUCOSE 116* 173* 165* 109* 110*  --   BUN 10 20 26* 25* 26*  --   CREATININE 0.51 0.64 0.52 0.50 0.61  --   CALCIUM 7.7* 7.6* 7.8* 7.8* 7.8*  --   MG 1.7 2.6* 2.0 1.5* 1.6* 1.7  PHOS 4.0 4.3 3.8 3.8 4.4 3.4   CBC: Recent Labs  Lab 02/02/23 0414 02/02/23 2103 02/04/23 0254 02/05/23 0308 02/06/23 0335 02/07/23 0519  WBC 8.6  --  11.0* 9.1 9.4 8.7  NEUTROABS  --   --   --  5.3 6.2 5.7  HGB 7.0* 8.8* 8.3* 8.4* 8.2* 7.7*  HCT 21.3* 26.8* 25.4* 26.8* 25.9* 24.8*  MCV 84.5  --  84.7 86.5 86.3 87.0  PLT 103*  --  178 223 267 281   Microbiology: Results for orders placed or performed during the hospital encounter of 01/24/23  Blood culture (routine x 2)     Status: None   Collection Time: 01/24/23  2:52 PM   Specimen: BLOOD  Result Value Ref Range Status   Specimen Description   Final    BLOOD LEFT ANTECUBITAL Performed at [facility], [address] [place], [place] [address]    Special Requests   Final    BOTTLES DRAWN AEROBIC AND ANAEROBIC Blood Culture adequate volume Performed at [facility], [address], [place], [place] [address]    Culture  Final    NO GROWTH 5 DAYS Performed at [facility], [address], [place], [place] [address]    Report Status 01/29/2023 FINAL  Final  Culture, blood (Routine X 2) w Reflex to ID Panel     Status: None   Collection Time: 01/24/23  9:12 PM   Specimen: BLOOD LEFT HAND  Result Value Ref Range Status   Specimen Description   Final    BLOOD LEFT HAND Performed at [facility], [address], [place], [place] [address]    Special Requests   Final     BOTTLES DRAWN AEROBIC AND ANAEROBIC BOTTLES DRAWN AEROBIC ONLY Performed at [facility], [address], [place], [place] [address]    Culture   Final    NO GROWTH 5 DAYS Performed at [facility], [address] [place], [place] [address]    Report Status 01/29/2023 FINAL  Final  Urine Culture     Status: Abnormal   Collection Time: 01/31/23  2:22 PM   Specimen: Urine, Clean Catch  Result Value Ref Range Status   Specimen Description   Final    URINE, CLEAN CATCH Performed at [facility], [address] [place], [place] [address]    Special Requests   Final    NONE Performed at [facility], [address], [place], [place] [address]    Culture MULTIPLE SPECIES PRESENT, SUGGEST RECOLLECTION (A)  Final   Report Status 02/01/2023 FINAL  Final  Culture, blood (Routine X 2) w Reflex to ID Panel     Status: None   Collection Time: 02/01/23  9:51 AM   Specimen: BLOOD RIGHT HAND  Result Value Ref Range Status   Specimen Description   Final    BLOOD RIGHT HAND Performed at [facility], [address] [place], [place] [address]    Special Requests   Final    BOTTLES DRAWN AEROBIC AND ANAEROBIC Blood Culture adequate volume Performed at [facility], [address], [place], [place] [address]    Culture   Final    NO GROWTH 5 DAYS Performed at [facility], [address] [place], [place] [address]    Report Status 02/06/2023 FINAL  Final  Culture, blood (Routine X 2) w Reflex to ID Panel     Status: None   Collection Time: 02/01/23  9:52 AM   Specimen: Right Antecubital; Blood  Result Value Ref Range Status   Specimen Description   Final    RIGHT ANTECUBITAL BLOOD Performed at [facility], [address], [place], [place] [address]    Special Requests   Final    BOTTLES DRAWN AEROBIC AND ANAEROBIC Blood Culture adequate volume Performed at [facility], [address] [place], [place] [address]    Culture   Final    NO GROWTH 5 DAYS Performed at [facility], [address], [place], [place] [address]    Report Status 02/06/2023 FINAL  Final  MRSA Next Gen by PCR, Nasal     Status: None   Collection Time: 02/01/23  9:52 AM   Specimen: Nasal Mucosa; Nasal Swab  Result Value Ref Range Status   MRSA by PCR Next Gen NOT DETECTED NOT DETECTED Final    Comment: (NOTE) The GeneXpert MRSA Assay (FDA approved for NASAL specimens only), is one component of a comprehensive MRSA colonization surveillance program. It  is not intended to diagnose MRSA infection nor to guide or monitor treatment for MRSA infections. Test performance is not FDA approved in patients less than 28 years old. Performed at [facility], [address], [place], [place] [address]    Time coordinating discharge: Over 30 minutes  Leeroy Bock, MD  [facility] 02/07/2023, 2:48 PM

## 2023-02-07 NOTE — Progress Notes (Signed)
PHARMACY - TOTAL PARENTERAL NUTRITION CONSULT NOTE   Indication: Small bowel obstruction  Patient Measurements: Height: 5\' 2"  (157.5 cm) Weight: 63.9 kg (140 lb 14 oz) IBW/kg (Calculated) : 50.1 TPN AdjBW (KG): 56.5 Body mass index is 25.77 kg/m.  Assessment:  Pharmacy consulted to start TPN on 52 yo female admitted with abdominal pain, distention, and nausea with episodes of emesis over the past 2 weeks. Extensive history of small bowel stricturing and prior surgeries d/t Crohn's disease. CT shows distal SBO. Recent admission in March 2024 for SBO with NGT & TPN, plans for surgery, but pt left AMA. Returned 6/12 then underwent surgery on 6/17.   Glucose / Insulin: no hx of DM BG goal: <150.  CBG checks and SSI discontinued. Electrolytes: No lab recheck this morning. Weaning TPN. Renal: SCr stable, BUN slightly elevated but stable Hepatic: LFTs, Tbili stable WNL; albumin low. Alk Phos elevated - TG WNL (6/24) I/O:  -UOP: 200 mL + 8 unmeasured -No stool charted. LBM 6/22 -mIVF: NS @ 40 mL/hr  GI Imaging: - 6/13 CTa/p: high-grade distal small-bowel obstruction d/t stricture vs adhesions; no active Crohn's dz noted - 6/15 AXR: persistent SBO GI Surgeries / Procedures:  - 6/17: Ex lap with lysis of adhesions, resection of prior ileocolic anastomosis (which had become strictured), new ileocolic anastomosis. 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  Central access: PICC TPN start date: 6/16   Nutritional Goals: Current TPN formulation (55 g AA, 15% CHO, 30 g/L lipid) at 80 ml/hr provides 105 g protein, 1978 kcal, 1920 mL daily - Goal K >/= 4.0; Mg >/= 2.0 for ileus  RD Assessment: Estimated Needs Total Energy Estimated Needs: 1800-2000 Total Protein Estimated Needs: 90-105g Total Fluid Estimated Needs: 2L/day  Current Nutrition:  TPN Soft diet  Plan:  Received order from [facility] to stop TPN today.   TPN  already infusing at 1/2 of goal rate. Will discontinue TPN and associated lab orders. Pharmacy to sign off.   Cindi Carbon, PharmD 02/07/23 10:15 AM

## 2023-02-07 NOTE — Progress Notes (Signed)
9 Days Post-Op  Subjective: CC: Some incisional pain that is well controlled with medications. Tolerating diet without n/v. BM yesterday. Voiding.   Her brother plans to stay with her at d/c.   Objective: Vital signs in last 24 hours: Temp:  [98.1 F (36.7 C)-99.1 F (37.3 C)] 99.1 F (37.3 C) (06/26 0535) Pulse Rate:  [94-110] 94 (06/26 0535) Resp:  [18-20] 20 (06/26 0535) BP: (130-154)/(84-103) 130/84 (06/26 0535) SpO2:  [98 %-100 %] 98 % (06/26 0535) Weight:  [63.9 kg] 63.9 kg (06/26 0500) Last BM Date : 02/06/23  Intake/Output from previous day: 06/25 0701 - 06/26 0700 In: 1447.9 [P.O.:145; I.V.:1302.9] Out: 200 [Urine:200] Intake/Output this shift: No intake/output data recorded.  PE: Gen:  Alert, NAD, pleasant Abd: Soft, no distension, appropriately tender around midline incision, no rigidity or guarding and otherwise NT, +BS. Midline wound appears cdi with staples in place. Do drainage or surrounding skin changes.   Lab Results:  Recent Labs    02/06/23 0335 02/07/23 0519  WBC 9.4 8.7  HGB 8.2* 7.7*  HCT 25.9* 24.8*  PLT 267 281    BMET Recent Labs    02/05/23 0308 02/06/23 0335  NA 140  --   K 3.6 3.7  CL 106  --   CO2 27  --   GLUCOSE 110*  --   BUN 26*  --   CREATININE 0.61  --   CALCIUM 7.8*  --     PT/INR No results for input(s): "LABPROT", "INR" in the last 72 hours. CMP     Component Value Date/Time   NA 140 02/05/2023 0308   K 3.7 02/06/2023 0335   CL 106 02/05/2023 0308   CO2 27 02/05/2023 0308   GLUCOSE 110 (H) 02/05/2023 0308   BUN 26 (H) 02/05/2023 0308   CREATININE 0.61 02/05/2023 0308   CALCIUM 7.8 (L) 02/05/2023 0308   PROT 4.8 (L) 02/05/2023 0308   ALBUMIN 1.9 (L) 02/05/2023 0308   AST 26 02/05/2023 0308   ALT 35 02/05/2023 0308   ALKPHOS 169 (H) 02/05/2023 0308   BILITOT 0.5 02/05/2023 0308   GFRNONAA >60 02/05/2023 0308   GFRAA  06/15/2010 0447    >60        The eGFR has been calculated using the MDRD  equation. This calculation has not been validated in all clinical situations. eGFR's persistently <60 mL/min signify possible Chronic Kidney Disease.   Lipase     Component Value Date/Time   LIPASE 22 01/24/2023 1240    Studies/Results: No results found.  Anti-infectives: Anti-infectives (From admission, onward)    Start     Dose/Rate Route Frequency Ordered Stop   02/02/23 1000  vancomycin (VANCOREADY) IVPB 1250 mg/250 mL  Status:  Discontinued        1,250 mg 166.7 mL/hr over 90 Minutes Intravenous Every 24 hours 02/01/23 1246 02/02/23 1216   02/01/23 1200  ceFEPIme (MAXIPIME) 2 g in sodium chloride 0.9 % 100 mL IVPB  Status:  Discontinued        2 g 200 mL/hr over 30 Minutes Intravenous Every 8 hours 02/01/23 1106 02/05/23 1432   02/01/23 1100  vancomycin (VANCOREADY) IVPB 1250 mg/250 mL        1,250 mg 166.7 mL/hr over 90 Minutes Intravenous NOW 02/01/23 1001 02/01/23 1148   02/01/23 1015  metroNIDAZOLE (FLAGYL) IVPB 500 mg  Status:  Discontinued        500 mg 100 mL/hr over 60 Minutes Intravenous 2 times daily  02/01/23 1009 02/05/23 1432   02/01/23 0830  Ampicillin-Sulbactam (UNASYN) 3 g in sodium chloride 0.9 % 100 mL IVPB  Status:  Discontinued        3 g 200 mL/hr over 30 Minutes Intravenous Every 6 hours 02/01/23 0744 02/01/23 1010   01/29/23 0800  cefoTEtan (CEFOTAN) 2 g in sodium chloride 0.9 % 100 mL IVPB        2 g 200 mL/hr over 30 Minutes Intravenous On call to O.R. 01/28/23 0815 01/29/23 1301        Path  FINAL MICROSCOPIC DIAGNOSIS:   A. SMALL BOWEL, ANASTOMOTIC STRICTURE, RESECTION:  Fibromuscular hypertrophy with adhesions and proximal dilatation  consistent with anastomotic stricture.  Negative for malignancy.    Assessment/Plan POD 9 s/p ex lap, loa, resection of ileocolic anastomosis with new ileocolic anastomosis for sbo 2/2 strictured ileocolic anastomosis by Dr. Magnus Ivan 01/29/23 - Path benign - CT 6/20 reassuring with no evidence of  abscess or leak.  - Tolerating soft diet and having bowel function. Stop TPN - Ambulate, PT. Currently rec for [facility] PT.  - Pulm toilet.  ABL anemia - hgb overall now stable. No hematochezia or melena reported   FEN - Soft diet. Stop TPN, IVF per [facility] VTE - SCDs, anticoagulation on hold for abl anemia and thrombocytopenia - now improved/stable. Will discuss with MD if able to restart anticoagulation today.  ID - No current abx. Afebrile. WBC wnl Foley - Out, voiding.  Plan - As above. Will discuss with MD if able to start anticoagulation. Otherwise she is stable for d/c from our standpoint when [facility] is arranged. We will arrange f/u. Discussed discharge instructions, restrictions and return/call back precautions.    - Per [facility] -  Hx Crohn's - Per GI. Per note, has plans to start biologic therapy in outpatient setting with primary GI team (through [facility]) Hx HTN Hx CVA w/ Chronic left hemiparesis  Hx PE/DVT - Per notes, dx in Feb this year. [facility] plans to tx when ABL anemia stable/improved. Will discuss with MD if okay to start   LOS: 14 days    [name] , [facility] 02/07/2023, 9:35 AM Please see Amion for pager number during day hours 7:00am-4:30pm

## 2023-02-07 NOTE — TOC Progression Note (Signed)
Transition of Care [facility]) - Progression Note    Patient Details  Name: Tara Berger MRN: [id number] Date of Birth: 1971-03-27  Transition of Care [facility]) CM/SW Contact  Howell Rucks, RN Phone Number: 02/07/2023, 11:44 AM  Clinical Narrative:  NCM outreached to [name] at [phone] per her request to discuss pt's dc plans. Pt's sister requesting HH therapy, HHA for assist with ADL's and HH RN for pt as pt lives alone. NCM informed pt's sister  [facility] PT has been arranged with [facility], agency will contact pt the day after pt's discharges to schedule home visit, pt's sister states pt will need a HH RN for her incision care, NCM informed pt's sister that there are no wound care orders for pt at this time and when to call the physician will be included in pt's discharge instructions. NCM informed pt's sister that HHA will need to be arranged by pt/family as it is usually not covered by pt's insurance as it is  a non skilled service. Pt's sister verbalized understanding, she had no further questions/concerns.       Expected Discharge Plan: Home w Home Health Services Barriers to Discharge: Continued Medical Work up  Expected Discharge Plan and Services In-house Referral: Clinical Social Work   Post Acute Care Choice: Home Health Living arrangements for the past 2 months: Apartment                 DME Arranged: N/A DME Agency: NA       HH Arranged: PT HH Agency: [facility] Date HH Agency Contacted: 01/31/23 Time HH Agency Contacted: 1516 Representative spoke with at [facility] Agency: Morrie Sheldon   Social Determinants of Health (SDOH) Interventions SDOH Screenings   Food Insecurity: No Food Insecurity (01/27/2023)  Housing: Low Risk  (01/27/2023)  Transportation Needs: No Transportation Needs (01/27/2023)  Utilities: Not At Risk (01/27/2023)  Tobacco Use: High Risk (02/03/2023)    Readmission Risk Interventions    02/07/2023   11:44 AM  Readmission Risk  Prevention Plan  Transportation Screening Complete  PCP or Specialist Appt within 3-5 Days Complete  HRI or Home Care Consult Complete  Social Work Consult for Recovery Care Planning/Counseling Complete  Palliative Care Screening Not Applicable  Medication Review Ocean[employer]) Complete

## 2023-02-07 NOTE — Discharge Instructions (Signed)
MIDLINE WOUND CARE: - midline dressing to be changed daily - supplies: normal saline, kerlix/gauze, scissors, ABD pads, tape  - remove dressing and all packing carefully, moistening with sterile saline as needed to avoid packing/internal dressing sticking to the wound. - clean edges of skin around the wound with water/gauze, making sure there is no tape debris or leakage left on skin that could cause skin irritation or breakdown. - dampen a clean kerlix/gauze with saline and pack wound from wound base to skin level,  Wound can be packed loosely. Trim kerlix to size if a whole kerlix is not required. - cover wound with a dry ABD pad and secure with tape.  - apply any skin protectant/powder recommended by clinician to protect skin/skin folds. - change dressing as needed if leakage occurs, wound gets contaminated, or patient requests to shower. - patient may shower daily with wound open and following the shower the wound should be dried and a clean dressing placed.     CIRUGA: INSTRUCCIONES POSTOP (Ciruga de obstruccin del intestino delgado, reseccin de colon, etc.)   ############################################## ###################  COME Transicin gradual a una dieta alta en fibra con un suplemento de fibra durante los prximos das despus del alta  CAMINAR Camina una hora al da. Controla tu dolor para hacer eso.  CONTROLAR EL DOLOR Controle el dolor para que pueda caminar, dormir, tolerar estornudos/tos, subir/bajar escaleras.  TENGA UNA DEPOSICIN DIARIAMENTE Mantenga sus evacuaciones regulares para evitar problemas. Est bien probar un laxante para anular el estreimiento. Est bien usar un antidiarreico para retrasar la diarrea. Llame si no mejora despus de 2 intentos  LLAME SI TIENE PROBLEMAS/INQUIETUDES Llame si todava tiene problemas a pesar de seguir estas instrucciones. Llame si tiene inquietudes que no han sido respondidas por estas  instrucciones  ############################################## ###################   DIETA Seguir una dieta ligera los primeros das en casa. Comience con una dieta blanda como sopas, lquidos, alimentos ricos en almidn, alimentos bajos en grasa, etc. Si se siente lleno, hinchado o estreido, siga una dieta lquida completa o en pur/licuada durante unos das hasta que se sienta mejor y no ms tiempo estreido. Asegrese de beber muchos lquidos todos los das para evitar deshidratarse (sentirse mareado, no orinar, etc.). Agregue gradualmente un suplemento de fibra a su dieta durante la prxima semana. Vuelva gradualmente a una dieta slida regular. Evite la comida rpida o las comidas pesadas la primera semana, ya que es ms probable que tenga nuseas. Se espera que su tracto digestivo necesite algunos meses para volver a la normalidad. Es comn que sus evacuaciones intestinales y heces sean irregulares. Tendr hinchazn y calambres ocasionales que eventualmente desaparecern. Hasta que est comiendo alimentos slidos normalmente, deje de tomar todos los medicamentos para el dolor y regrese a sus actividades regulares; sus intestinos no sern normales. Concntrese en llevar una dieta baja en grasas y alta en fibra por el resto de su vida (consulte Cmo lograr una buena salud intestinal, a continuacin).  CUIDADO de su INCISIN o HERIDA Es bueno para incisiones cerradas e incluso heridas abiertas para lavarse todos los das. Dchate todos los das. Los baos cortos estn bien. Lave las incisiones y heridas con agua y jabn.  Si tiene una(s) incisin(es) cerrada(s), lvela con agua y jabn todos los das. Puede dejar las incisiones cerradas abiertas al aire si est seco. Puede cubrir la incisin con una gasa limpia y volver a colocarla despus de su ducha diaria para mayor comodidad.  Es bueno lavar las incisiones cerradas e incluso las   heridas abiertas todos los das. Dchate todos los das. Los  baos cortos estn bien. Lave las incisiones y heridas con agua y jabn. Puede dejar las incisiones cerradas abiertas al aire si est seco. Puede cubrir la incisin con una gasa limpia y volver a colocarla despus de su ducha diaria para mayor comodidad.  Si tiene una herida abierta con una aspiradora para heridas, consulte las instrucciones de cuidado de la aspiradora para heridas.   ACTIVIDADES segn tolerancia Inicie actividades diarias livianas (cuidado personal, caminar, subir escaleras) a partir del da posterior a la ciruga. Aumente gradualmente las actividades segn lo tolere. Controla tu dolor para estar activo. Detngase cuando est cansado. Lo ideal es caminar varias veces al da, eventualmente una hora al da. La mayora de las personas regresan a la mayora de las actividades cotidianas en unas pocas semanas. Se necesitan de 4 a 8 semanas para volver a la actividad intensa y sin restricciones. Si puede caminar 30 minutos sin dificultad, es seguro intentar una actividad ms intensa como trotar, caminar en una caminadora, andar en bicicleta, ejercicios aerbicos de bajo impacto, nadar, etc. Guarde la actividad ms intensa y extenuante para el final (por lo general, de 4 a 8 semanas despus de la ciruga), como abdominales, levantar objetos pesados, deportes de contacto, etc. Abstngase de cualquier trabajo pesado intenso o esfuerzo hasta que deje de tomar narcticos para controlar el dolor. Tendr das libres, pero las cosas deberan mejorar semana a semana. NO EMPUJE A TRAVS DEL DOLOR. Deja que el dolor sea tu gua: si te duele hacer algo, no lo hagas. El dolor es tu cuerpo advirtindote que evites esa actividad durante otra semana hasta que el dolorbaja. Puede conducir cuando ya no est tomando medicamentos narcticos recetados para el dolor, puede usar cmodamente el cinturn de seguridad y puede hacer giros/paradas repentinos de manera segura para protegerse sin vacilar debido al  dolor. Puede tener relaciones sexuales cuando le resulte cmodo. Si te duele hacer algo, detente.  MEDICAMENTOS Tome los medicamentos que le recetan normalmente en casa, a menos que le indiquen lo contrario. Anticoagulantes: Por lo general, puede reiniciar cualquier anticoagulante fuerte despus del segundo da posoperatorio. Est bien tomar una aspirina de inmediato.   Si est tomando anticoagulantes fuertes (warfarina/Coumadin, Plavix, Xerelto, Eliquis, Pradaxa, etc.), hable con su cirujano, mdico de atencin primaria y/o cardilogo para obtener instrucciones sobre cundo reiniciar el anticoagulante y si es necesario controlar la sangre. (anlisis de sangre PT/INR, etc.).  CONTROL DE DOLOR El dolor despus de la ciruga o relacionado con la actividad a menudo se debe a una tensin/lesin en el msculo, tendn, nervios y/o incisiones. Este dolor suele ser a corto plazo y mejorar en unos pocos meses. Para ayudar a acelerar el proceso de curacin y volver a la actividad normal ms rpidamente, HAGAN LAS SIGUIENTES COSAS JUNTOS: 1. Aumente la actividad gradualmente. NO EMPUJE A TRAVS DEL DOLOR 2. Usa Hielo y/o Calor 3. Prueba masajes suaves y/o estiramientos 4. Tome analgsicos de venta libre 5. Tome analgsicos recetados con narcticos para el dolor ms intenso  Buen control del dolor = recuperacin ms rpida. Es mejor tomar ms medicamentos para estar ms activo que quedarse en cama todo el da para evitar los medicamentos. 1. Incrementa la actividad gradualmente Evite levantar objetos pesados al principio, luego aumente a levantar segn lo tolere durante las prximas 6 semanas. No "empuje a travs" del dolor. Escucha a tu cuerpo y evita posiciones y maniobras que reproduzcan el dolor. Espera unos das antes de probar   algo ms intenso. Se recomienda caminar una hora al da para ayudar a que su cuerpo se recupere ms rpido y de manera ms segura. Comience lentamente y detngase cuando  sienta dolor. Si puedes caminar 30 minutos sin parar ni sentir dolor, puedes probar con actividades ms intensas (correr, trotar, aerbicos, andar en bicicleta, nadar, caminadora, sexo, deportes, levantamiento de pesas, etc.) Recuerda: si te duele hacerlo, no lo hagas! 2. Usa Hielo y/o Calor Tendr hinchazn y moretones alrededor de las incisiones. Esto tardar varias semanas en resolverse. Bolsas de hielo o almohadillas trmicas (6 a 8 veces al da, 30 a 60 minutos cada vez) ayudarn a aliviar el dolor y los moretones. Algunas personas prefieren usar hielo solo, calor solo o alternar entre hielo y calor. Experimente y vea qu funciona mejor para usted. Considere probar el hielo durante los primeros das para ayudar a disminuir la hinchazn y los moretones; luego, cambie a calor para ayudar a relajar los puntos doloridos y acelerar la recuperacin. Dchate todos los das. Los baos cortos estn bien. Se siente bien! Mantenga las incisiones y heridas limpias con agua y jabn. 3. Prueba masajes suaves y/o estiramientos Masaje en el rea del dolor muchas veces al da Detente si sientes dolor, no te excedas. 4. Tome analgsicos de venta libre Esto ayuda a que los tejidos musculares y nerviosos se vuelvan menos irritables y se calmen ms rpido. Elija UNO de los siguientes medicamentos antiinflamatorios de venta libre: Pastillas de 500 mg de acetaminofn (Tylenol) 1 o 2 pastillas con cada comida y justo antes de acostarse (evtelo si tiene problemas hepticos o si tiene acetaminofn en su receta de narcticos) Tabletas de naproxeno de 220 mg (p. ej., Aleve, Naprosyn) 1 o 2 tabletas dos veces al da (evtela si tiene problemas de rin, estmago, EII o sangrado) Pastillas de 200 mg de ibuprofeno (p. ej., Advil, Motrin) 3 o 4 pastillas con cada comida y justo antes de acostarse (evtelo si tiene problemas de rin, estmago, EII o sangrado) Tmelo con comida/refrigerio varias veces al da segn las  indicaciones durante al menos 2 semanas para ayudar a mantener el dolor/dolor bajo y ms manejable. 5. Tome analgsicos recetados con narcticos para el dolor ms intenso A menudo se le da una receta para un fuerte control del dolor despus del alta (por ejemplo: oxicodona/Percocet, hidrocodona/Norco/Vicodin o tramadol/Ultram) Tome su medicamento para el dolor segn lo prescrito. Tenga en cuenta que la mayora de las recetas de narcticos tambin contienen Tylenol (acetaminofeno); evite tomar demasiado Tylenol. Si tiene problemas/inquietudes con el medicamento recetado (no controla el dolor, las nuseas, los vmitos, el sarpullido, la picazn, etc.), llmenos al [phone] para ver si necesitamos cambiarlo por otro para el dolor. medicamento que funcionar mejor para usted y/o controlar mejor sus efectos secundarios. Si necesita un resurtido de su medicamento para el dolor, debe llamar a la oficina antes de las 4:00 p. m. y solo entre semana. Por ley federal, las recetas de narcticos no se pueden pedir en una farmacia. Deben ser llenados en papel y recogidos en nuestra oficina por el paciente o cuidador autorizado. Las recetas no se pueden surtir despus de las 4 pm ni los fines de semana.  CUANDO LLAMARNOS [phone] Dolor intenso no controlado o que empeora Fiebre superior a 101 F (38,5 C) Inquietudes con la incisin: Empeoramiento del dolor, enrojecimiento, sarpullido/urticaria, hinchazn, sangrado o drenaje Reacciones/problemas con nuevos medicamentos (picazn, sarpullido, urticaria, nuseas, etc.) Nuseas y d/o vmitos Dificultad para orinar Respiracin dificultosa Empeoramiento de la fatiga,   mareos, aturdimiento, visin borrosa Otras preocupaciones Si no mejora despus de dos semanas o nota que empeora, comunquese con nuestra oficina al [phone] para obtener ms consejos. Es posible que necesitemos ajustar sus medicamentos, volver a evaluarlo en el consultorio, enviarlo a la  sala de emergencias o ver qu otras cosas podemos hacer para ayudarlo. El personal de la clnica est disponible para responder sus preguntas durante el horario comercial habitual (8:30 a. m. a 5 p. m.). No dude en llamar y pedir hablar con uno de nuestros enfermeros si tiene inquietudes clnicas. Un cirujano de [facility] est siempre de guardia en los hospitales las 24 horas del da.  Si tiene una emergencia mdica, vaya a la sala de emergencias ms cercana o llame al 911.  SEGUIMIENTO en nuestra oficina El da de su alta del hospital (o el siguiente da laborable de la semana), llame a [facility] para programar o confirmar una cita para ver a su cirujano en el consultorio para una cita de seguimiento. Por lo general, es de 2 a 3 semanas despus de la ciruga. Si tiene grapas para la piel en su(s) incisin(es), infrmele al consultorio para que podamos programar un horario en el consultorio para que la enfermera se las quite (generalmente alrededor de 10 das despus de la ciruga). Asegrese de llamar para programar citas el da del alta (o el siguiente da laborable de la semana) del hospital para garantizar una hora de cita conveniente. SI TIENE FORMULARIOS DE DISCAPACIDAD O LICENCIA FAMILIAR, LLEVARLOS A LA OFICINA PARA PROCESARLOS. NO SE LOS D A SU MDICO.  [facility] [place] [address], [address], [place], [place] [address] ? [phone] - Principal [phone] - Nmero gratuito, [phone] - Fax [email or web address]   LLEGAR A UNA BUENA SALUD INTESTINAL. Se espera que su tracto digestivo necesite algunos meses para volver a la normalidad. Es comn que sus evacuaciones intestinales y heces sean irregulares. Tendr hinchazn y calambres ocasionales que eventualmente desaparecern. Hasta que est comiendo alimentos slidos normalmente, deje de tomar todos los medicamentos para el dolor y regrese a sus actividades  regulares; sus intestinos no sern normales. Evitar el estreimiento El objetivo: UNA EVACUACIN INTESTINAL SUAVE AL DA! Beber mucho lquido. Elige agua primero. TOMA UN SUPLEMENTO DE FIBRA CADA DA EL RESTO DE TU VIDA Durante su primera semana de regreso a casa, agregue gradualmente un suplemento de fibra todos los das. Experimente qu forma puede tolerar. Hay muchas formas, como polvos, tabletas, obleas, gomitas, etc. Salvado de psyllium (Metamucil), metilcelulosa (Citrucel), Miralax o Glycolax, Benefiber, Linaza. Ajuste la dosis semana a semana (1/2 dosis/da a 6 dosis al da) hasta que est defecando 1-2 veces al da. Reduzca la dosis o pruebe con un producto de fibra diferente si le causa problemas como diarrea o distensin abdominal. A veces, se necesita un laxante para ayudar a impulsar los intestinos si est estreido hasta que el suplemento de fibra pueda ayudar a regular sus intestinos. Si tolera comer y se est tirando pedos, est bien probar un laxante suave como la dosis doble de MiraLax, jugo de ciruela pasa o leche de magnesia. Evite el uso de laxantes con demasiada frecuencia. Los ablandadores de heces a veces pueden ayudar a contrarrestar los efectos de estreimiento de los analgsicos narcticos. Tambin puede causar diarrea, as que evite usarlo por mucho tiempo. Si todava est estreido a pesar de tomar fibra diariamente, comer slidos y algunas dosis de laxantes, llame a nuestra oficina. Controlando la diarrea Intente   beber lquidos y comer alimentos blandos durante unos das para evitar estresar an ms sus intestinos. Evite los productos lcteos (especialmente la leche y el helado) por un corto tiempo. Los intestinos a menudo pueden perder la capacidad de digerir la lactosa cuando estn estresados. Evite los alimentos que causan gases o hinchazn. Los alimentos tpicos incluyen frijoles y otras legumbres, repollo, brcoli y productos lcteos. Evite las comidas grasosas,  picantes y rpidas. Cada persona tiene cierta sensibilidad a otros alimentos, as que escuche a su cuerpo y evite aquellos alimentos que le provoquen problemas. Los probiticos (como el yogur activo, Align, etc.) pueden ayudar a repoblar los intestinos y el colon con bacterias normales y calmar un tracto digestivo sensible Agregar un suplemento de fibra gradualmente puede ayudar a espesar las heces al absorber el exceso de lquido y volver a entrenar a los intestinos para que acten con ms normalidad. Aumente lentamente la dosis durante unas pocas semanas. Demasiada fibra demasiado pronto puede ser contraproducente y causar calambres e hinchazn. Est bien tratar de disminuir la diarrea con unas pocas dosis de medicamentos antidiarreicos. El subsalicilato de bismuto (por ejemplo, Kayopectate, Pepto Bismol) en algunas dosis puede ayudar a controlar la diarrea. Evitar si est embarazada. La loperamida (Imodium) puede retrasar la diarrea. Comience con una tableta (2 mg) primero. Evtelo si tiene fiebre o dolor intenso. LOS PACIENTES CON ILEOSTOMA TENDRN DIARREA CRNICA ya que su colon no est en uso. Beba muchos lquidos. Deber beber incluso ms vasos de agua/lquido al da para evitar deshidratarse. Registre el resultado de su ileostoma. Espere vaciar la bolsa cada 3-4 horas al principio. La mayora de las personas con una ileostoma permanente vace su bolsa 4-6 veces como mnimo. Use medicamentos antidiarreicos (especialmente Imodium) varias veces al da para evitar deshidratarse. Comience con una dosis a la hora de acostarse y desayunar. Ajuste hacia arriba o hacia abajo segn sea necesario. Aumente los medicamentos antidiarreicos segn las indicaciones para evitar vaciar la bolsa ms de 8 veces al da (cada 3 horas). Trabaje con su enfermera de ostoma de heridas para aprender a cuidar su ostoma. Consulte las instrucciones de cuidado de la ostoma. RESOLUCIN DE PROBLEMAS DE INTESTINOS  IRREGULARES 1) Comience con una dieta suave y blanda. Nada de comidas picantes, grasosas o fritas. 2) Evite el gluten/trigo o productos lcteos de la dieta para ver si los sntomas mejoran. 3) Miralax 17gm o semillas de lino mezcladas en 8oz. agua o jugo-diariamente. Puede usar 2-4 veces al da segn sea necesario. 4) Gas-X, Phazyme, etc. segn sea necesario para gases e hinchazn. 5) Prilosec (omeprazol) de venta libre segn sea necesario 6) Considere los probiticos (Align, Activa, etc.) para ayudar a calmar los intestinos  Llame a su mdico si est empeorando o no est mejorando. A veces, se pueden necesitar ms pruebas (cultivos, endoscopia, estudios de rayos X, tomografas computarizadas, anlisis de sangre, etc.) para ayudar a diagnosticar y tratar la causa de la diarrea. [facility] de [facility], [place] [address], [address], [place], [place] [address] [phone] - Principal. [phone] - Llamada gratuita. [phone] - Fax [email or web address]   ##########################################  SURGERY: POST OP INSTRUCTIONS (Surgery for small bowel obstruction, colon resection, etc)   ######################################################################  EAT Gradually transition to a high fiber diet with a fiber supplement over the next few days after discharge  WALK Walk an hour a day.  Control your pain to do that.    CONTROL PAIN Control pain so that you can walk, sleep, tolerate sneezing/coughing, go up/down stairs.    HAVE A BOWEL MOVEMENT DAILY Keep your bowels regular to avoid problems.  OK to try a laxative to override constipation.  OK to use an antidairrheal to slow down diarrhea.  Call if not better after 2 tries  CALL IF YOU HAVE PROBLEMS/CONCERNS Call if you are still struggling despite following these instructions. Call if you have concerns not answered by these  instructions  ######################################################################   DIET Follow a light diet the first few days at home.  Start with a bland diet such as soups, liquids, starchy foods, low fat foods, etc.  If you feel full, bloated, or constipated, stay on a ful liquid or pureed/blenderized diet for a few days until you feel better and no longer constipated. Be sure to drink plenty of fluids every day to avoid getting dehydrated (feeling dizzy, not urinating, etc.). Gradually add a fiber supplement to your diet over the next week.  Gradually get back to a regular solid diet.  Avoid fast food or heavy meals the first week as you are more likely to get nauseated. It is expected for your digestive tract to need a few months to get back to normal.  It is common for your bowel movements and stools to be irregular.  You will have occasional bloating and cramping that should eventually fade away.  Until you are eating solid food normally, off all pain medications, and back to regular activities; your bowels will not be normal. Focus on eating a low-fat, high fiber diet the rest of your life (See Getting to Good Bowel Health, below).  CARE of your INCISION or WOUND  It is good for closed incisions and even open wounds to be washed every day.  Shower every day.  Short baths are fine.  Wash the incisions and wounds clean with soap & water.    You may leave closed incisions open to air if it is dry.   You may cover the incision with clean gauze & replace it after your daily shower for comfort.  If you have an open wound with a wound vac, see wound vac care instructions.    ACTIVITIES as tolerated Start light daily activities --- self-care, walking, climbing stairs-- beginning the day after surgery.  Gradually increase activities as tolerated.  Control your pain to be active.  Stop when you are tired.  Ideally, walk several times a day, eventually an hour a day.   Most people are back  to most day-to-day activities in a few weeks.  It takes 4-8 weeks to get back to unrestricted, intense activity. If you can walk 30 minutes without difficulty, it is safe to try more intense activity such as jogging, treadmill, bicycling, low-impact aerobics, swimming, etc. Save the most intensive and strenuous activity for last (Usually 4-8 weeks after surgery) such as sit-ups, heavy lifting, contact sports, etc.  Refrain from any intense heavy lifting or straining until you are off narcotics for pain control.  You will have off days, but things should improve week-by-week. DO NOT PUSH THROUGH PAIN.  Let pain be your guide: If it hurts to do something, don't do it.  Pain is your body warning you to avoid that activity for another week until the pain goes down. You may drive when you are no longer taking narcotic prescription pain medication, you can comfortably wear a seatbelt, and you can safely make sudden turns/stops to protect yourself without hesitating due to pain. You may have sexual intercourse when it is comfortable. If it hurts to   do something, stop.  MEDICATIONS Take your usually prescribed home medications unless otherwise directed.   Blood thinners:  Usually you can restart any strong blood thinners after the second postoperative day.  It is OK to take aspirin right away.     If you are on strong blood thinners (warfarin/Coumadin, Plavix, Xerelto, Eliquis, Pradaxa, etc), discuss with your surgeon, medicine PCP, and/or cardiologist for instructions on when to restart the blood thinner & if blood monitoring is needed (PT/INR blood check, etc).     PAIN CONTROL Pain after surgery or related to activity is often due to strain/injury to muscle, tendon, nerves and/or incisions.  This pain is usually short-term and will improve in a few months.  To help speed the process of healing and to get back to regular activity more quickly, DO THE FOLLOWING THINGS TOGETHER: Increase activity gradually.   DO NOT PUSH THROUGH PAIN Use Ice and/or Heat Try Gentle Massage and/or Stretching Take over the counter pain medication Take Narcotic prescription pain medication for more severe pain  Good pain control = faster recovery.  It is better to take more medicine to be more active than to stay in bed all day to avoid medications.  Increase activity gradually Avoid heavy lifting at first, then increase to lifting as tolerated over the next 6 weeks. Do not "push through" the pain.  Listen to your body and avoid positions and maneuvers than reproduce the pain.  Wait a few days before trying something more intense Walking an hour a day is encouraged to help your body recover faster and more safely.  Start slowly and stop when getting sore.  If you can walk 30 minutes without stopping or pain, you can try more intense activity (running, jogging, aerobics, cycling, swimming, treadmill, sex, sports, weightlifting, etc.) Remember: If it hurts to do it, then don't do it! Use Ice and/or Heat You will have swelling and bruising around the incisions.  This will take several weeks to resolve. Ice packs or heating pads (6-8 times a day, 30-60 minutes at a time) will help sooth soreness & bruising. Some people prefer to use ice alone, heat alone, or alternate between ice & heat.  Experiment and see what works best for you.  Consider trying ice for the first few days to help decrease swelling and bruising; then, switch to heat to help relax sore spots and speed recovery. Shower every day.  Short baths are fine.  It feels good!  Keep the incisions and wounds clean with soap & water.   Try Gentle Massage and/or Stretching Massage at the area of pain many times a day Stop if you feel pain - do not overdo it Take over the counter pain medication This helps the muscle and nerve tissues become less irritable and calm down faster Choose ONE of the following over-the-counter anti-inflammatory medications: Acetaminophen  500mg tabs (Tylenol) 1-2 pills with every meal and just before bedtime (avoid if you have liver problems or if you have acetaminophen in you narcotic prescription) Naproxen 220mg tabs (ex. Aleve, Naprosyn) 1-2 pills twice a day (avoid if you have kidney, stomach, IBD, or bleeding problems) Ibuprofen 200mg tabs (ex. Advil, Motrin) 3-4 pills with every meal and just before bedtime (avoid if you have kidney, stomach, IBD, or bleeding problems) Take with food/snack several times a day as directed for at least 2 weeks to help keep pain / soreness down & more manageable. Take Narcotic prescription pain medication for more severe pain A prescription for   strong pain control is often given to you upon discharge (for example: oxycodone/Percocet, hydrocodone/Norco/Vicodin, or tramadol/Ultram) Take your pain medication as prescribed. Be mindful that most narcotic prescriptions contain Tylenol (acetaminophen) as well - avoid taking too much Tylenol. If you are having problems/concerns with the prescription medicine (does not control pain, nausea, vomiting, rash, itching, etc.), please call us [phone] to see if we need to switch you to a different pain medicine that will work better for you and/or control your side effects better. If you need a refill on your pain medication, you must call the office before 4 pm and on weekdays only.  By federal law, prescriptions for narcotics cannot be called into a pharmacy.  They must be filled out on paper & picked up from our office by the patient or authorized caretaker.  Prescriptions cannot be filled after 4 pm nor on weekends.    WHEN TO CALL US [phone] Severe uncontrolled or worsening pain  Fever over 101 F (38.5 C) Concerns with the incision: Worsening pain, redness, rash/hives, swelling, bleeding, or drainage Reactions / problems with new medications (itching, rash, hives, nausea, etc.) Nausea and/or vomiting Difficulty urinating Difficulty  breathing Worsening fatigue, dizziness, lightheadedness, blurred vision Other concerns If you are not getting better after two weeks or are noticing you are getting worse, contact our office [phone] for further advice.  We may need to adjust your medications, re-evaluate you in the office, send you to the emergency room, or see what other things we can do to help. The clinic staff is available to answer your questions during regular business hours (8:30am-5pm).  Please don't hesitate to call and ask to speak to one of our nurses for clinical concerns.    A surgeon from [facility] is always on call at the hospitals 24 hours/day If you have a medical emergency, go to the nearest emergency room or call 911.  FOLLOW UP in our office One the day of your discharge from the hospital (or the next business weekday), please call [facility] to set up or confirm an appointment to see your surgeon in the office for a follow-up appointment.  Usually it is 2-3 weeks after your surgery.   If you have skin staples at your incision(s), let the office know so we can set up a time in the office for the nurse to remove them (usually around 10 days after surgery). Make sure that you call for appointments the day of discharge (or the next business weekday) from the hospital to ensure a convenient appointment time. IF YOU HAVE DISABILITY OR FAMILY LEAVE FORMS, BRING THEM TO THE OFFICE FOR PROCESSING.  DO NOT GIVE THEM TO YOUR DOCTOR.  [facility] [address], [address], [place], [place]  [address] ? [phone] - Main [phone] - Toll Free,  [phone] - Fax [email or web address]    GETTING TO GOOD BOWEL HEALTH. It is expected for your digestive tract to need a few months to get back to normal.  It is common for your bowel movements and stools to be irregular.  You will have occasional bloating and cramping that should eventually fade  away.  Until you are eating solid food normally, off all pain medications, and back to regular activities; your bowels will not be normal.   Avoiding constipation The goal: ONE SOFT BOWEL MOVEMENT A DAY!    Drink plenty of fluids.  Choose water first. TAKE A FIBER SUPPLEMENT EVERY DAY   THE REST OF YOUR LIFE During your first week back home, gradually add back a fiber supplement every day Experiment which form you can tolerate.   There are many forms such as powders, tablets, wafers, gummies, etc Psyllium bran (Metamucil), methylcellulose (Citrucel), Miralax or Glycolax, Benefiber, Flax Seed.  Adjust the dose week-by-week (1/2 dose/day to 6 doses a day) until you are moving your bowels 1-2 times a day.  Cut back the dose or try a different fiber product if it is giving you problems such as diarrhea or bloating. Sometimes a laxative is needed to help jump-start bowels if constipated until the fiber supplement can help regulate your bowels.  If you are tolerating eating & you are farting, it is okay to try a gentle laxative such as double dose MiraLax, prune juice, or Milk of Magnesia.  Avoid using laxatives too often. Stool softeners can sometimes help counteract the constipating effects of narcotic pain medicines.  It can also cause diarrhea, so avoid using for too long. If you are still constipated despite taking fiber daily, eating solids, and a few doses of laxatives, call our office. Controlling diarrhea Try drinking liquids and eating bland foods for a few days to avoid stressing your intestines further. Avoid dairy products (especially milk & ice cream) for a short time.  The intestines often can lose the ability to digest lactose when stressed. Avoid foods that cause gassiness or bloating.  Typical foods include beans and other legumes, cabbage, broccoli, and dairy foods.  Avoid greasy, spicy, fast foods.  Every person has some sensitivity to other foods, so listen to your body and avoid those  foods that trigger problems for you. Probiotics (such as active yogurt, Align, etc) may help repopulate the intestines and colon with normal bacteria and calm down a sensitive digestive tract Adding a fiber supplement gradually can help thicken stools by absorbing excess fluid and retrain the intestines to act more normally.  Slowly increase the dose over a few weeks.  Too much fiber too soon can backfire and cause cramping & bloating. It is okay to try and slow down diarrhea with a few doses of antidiarrheal medicines.   Bismuth subsalicylate (ex. Kayopectate, Pepto Bismol) for a few doses can help control diarrhea.  Avoid if pregnant.   Loperamide (Imodium) can slow down diarrhea.  Start with one tablet (2mg) first.  Avoid if you are having fevers or severe pain.  ILEOSTOMY PATIENTS WILL HAVE CHRONIC DIARRHEA since their colon is not in use.    Drink plenty of liquids.  You will need to drink even more glasses of water/liquid a day to avoid getting dehydrated. Record output from your ileostomy.  Expect to empty the bag every 3-4 hours at first.  Most people with a permanent ileostomy empty their bag 4-6 times at the least.   Use antidiarrheal medicine (especially Imodium) several times a day to avoid getting dehydrated.  Start with a dose at bedtime & breakfast.  Adjust up or down as needed.  Increase antidiarrheal medications as directed to avoid emptying the bag more than 8 times a day (every 3 hours). Work with your wound ostomy nurse to learn care for your ostomy.  See ostomy care instructions. TROUBLESHOOTING IRREGULAR BOWELS 1) Start with a soft & bland diet. No spicy, greasy, or fried foods.  2) Avoid gluten/wheat or dairy products from diet to see if symptoms improve. 3) Miralax 17gm or flax seed mixed in 8oz. water or juice-daily. May use 2-4 times a   day as needed. 4) Gas-X, Phazyme, etc. as needed for gas & bloating.  5) Prilosec (omeprazole) over-the-counter as needed 6)  Consider  probiotics (Align, Activa, etc) to help calm the bowels down  Call your doctor if you are getting worse or not getting better.  Sometimes further testing (cultures, endoscopy, X-ray studies, CT scans, bloodwork, etc.) may be needed to help diagnose and treat the cause of the diarrhea. [facility] [address], [address], [place], [place]  [address] [phone] - Main.    [phone]  - Toll Free.   [phone] - Fax [email or web address]   #######################################################  Ostomy Support Information  You've heard that people get along just fine with only one of their eyes, or one of their lungs, or one of their kidneys. But you also know that you have only one intestine and only one bladder, and that leaves you feeling awfully empty, both physically and emotionally: You think no other people go around without part of their intestine with the ends of their intestines sticking out through their abdominal walls.   YOU ARE NOT ALONE.  There are nearly three quarters of a million people in the [place] who have an ostomy; people who have had surgery to remove all or part of their colons or bladders.   There is even a national association, the [employer] with over 350 local affiliated support groups that are organized by volunteers who provide peer support and counseling. [employer] has a toll free telephone num-ber, 800-826-0826 and an educational, interactive website, www.ostomy.org   An ostomy is an opening in the belly (abdominal wall) made by surgery. Ostomates are people who have had this procedure. The opening (stoma) allows the kidney or bowel to grdischarge waste. An external pouch covers the stoma to collect waste. Pouches are are a simple bag and are odor free. Different companies have disposable or reusable pouches to fit one's lifestyle. An ostomy can either be temporary or permanent.   THERE ARE THREE MAIN  TYPES OF OSTOMIES Colostomy. A colostomy is a surgically created opening in the large intestine (colon). Ileostomy. An ileostomy is a surgically created opening in the small intestine. Urostomy. A urostomy is a surgically created opening to divert urine away from the bladder.  OSTOMY Care  The following guidelines will make care of your colostomy easier. Keep this information close by for quick reference.  Helpful DIET hints Eat a well-balanced diet including vegetables and fresh fruits. Eat on a regular schedule.  Drink at least 6 to 8 glasses of fluids daily. Eat slowly in a relaxed atmosphere. Chew your food thoroughly. Avoid chewing gum, smoking, and drinking from a straw. This will help decrease the amount of air you swallow, which may help reduce gas. Eating yogurt or drinking buttermilk may help reduce gas.  To control gas at night, do not eat after 8 p.m. This will give your bowel time to quiet down before you go to bed.  If gas is a problem, you can purchase Beano. Sprinkle Beano on the first bite of food before eating to reduce gas. It has no flavor and should not change the taste of your food. You can buy Beano over the counter at your local drugstore.  Foods like fish, onions, garlic, broccoli, asparagus, and cabbage produce odor. Although your pouch is odor-proof, if you eat these foods you may notice a stronger odor when emptying your pouch. If this is a concern, you may want to   limit these foods in your diet.  If you have an ileostomy, you will have chronic diarrhea & need to drink more liquids to avoid getting dehydrated.  Consider antidiarrheal medicine like imodium (loperamide) or Lomotil to help slow down bowel movements / diarrhea into your ileostomy bag.  GETTING TO GOOD BOWEL HEALTH WITH AN ILEOSTOMY    With the colon bypassed & not in use, you will have small bowel diarrhea.   It is important to thicken & slow your bowel movements down.   The goal: 4-6 small BOWEL  MOVEMENTS A DAY It is important to drink plenty of liquids to avoid getting dehydrated  CONTROLLING ILEOSTOMY DIARRHEA  TAKE A FIBER SUPPLEMENT (FiberCon or Benefiner soluble fiber) twice a day - to thicken stools by absorbing excess fluid and retrain the intestines to act more normally.  Slowly increase the dose over a few weeks.  Too much fiber too soon can backfire and cause cramping & bloating.  TAKE AN IRON SUPPLEMENT twice a day to naturally constipate your bowels.  Usually ferrous sulfate 325mg twice a day)  TAKE ANTI-DIARRHEAL MEDICINES: Loperamide (Imodium) can slow down diarrhea.  Start with two tablets (= 4mg) first and then try one tablet every 6 hours.  Can go up to 2 pills four times day (8 pills of 2mg max) Avoid if you are having fevers or severe pain.  If you are not better or start feeling worse, stop all medicines and call your doctor for advice LoMotil (Diphenoxylate / Atropine) is another medicine that can constipate & slow down bowel moevements Pepto Bismol (bismuth) can gently thicken bowels as well  If diarrhea is worse,: drink plenty of liquids and try simpler foods for a few days to avoid stressing your intestines further. Avoid dairy products (especially milk & ice cream) for a short time.  The intestines often can lose the ability to digest lactose when stressed. Avoid foods that cause gassiness or bloating.  Typical foods include beans and other legumes, cabbage, broccoli, and dairy foods.  Every person has some sensitivity to other foods, so listen to our body and avoid those foods that trigger problems for you.Call your doctor if you are getting worse or not better.  Sometimes further testing (cultures, endoscopy, X-ray studies, bloodwork, etc) may be needed to help diagnose and treat the cause of the diarrhea. Take extra anti-diarrheal medicines (maximum is 8 pills of 2mg loperamide a day)   Tips for POUCHING an OSTOMY   Changing Your Pouch The best time to  change your pouch is in the morning, before eating or drinking anything. Your stoma can function at any time, but it will function more after eating or drinking.   Applying the pouching system  Place all your equipment close at hand before removing your pouch.  Wash your hands.  Stand or sit in front of a mirror. Use the position that works best for you. Remember that you must keep the skin around the stoma wrinkle-free for a good seal.  Gently remove the used pouch (1-piece system) or the pouch and old wafer (2-piece system). Empty the pouch into the toilet. Save the closure clip to use again.  Wash the stoma itself and the skin around the stoma. Your stoma may bleed a little when being washed. This is normal. Rinse and pat dry. You may use a wash cloth or soft paper towels (like Bounty), mild soap (like Dial, Safeguard, or Ivory), and water. Avoid soaps that contain perfumes or lotions.    For a new pouch (1-piece system) or a new wafer (2-piece system), measure your stoma using the stoma guide in each box of supplies.  Trace the shape of your stoma onto the back of the new pouch or the back of the new wafer. Cut out the opening. Remove the paper backing and set it aside.  Optional: Apply a skin barrier powder to surrounding skin if it is irritated (bare or weeping), and dust off the excess. Optional: Apply a skin-prep wipe (such as Skin Prep or All-Kare) to the skin around the stoma, and let it dry. Do not apply this solution if the skin is irritated (red, tender, or broken) or if you have shaved around the stoma. Optional: Apply a skin barrier paste (such as Stomahesive, Coloplast, or Premium) around the opening cut in the back of the pouch or wafer. Allow it to dry for 30 to 60 seconds.  Hold the pouch (1-piece system) or wafer (2-piece system) with the sticky side toward your body. Make sure the skin around the stoma is wrinkle-free. Center the opening on the stoma, then press firmly to  your abdomen (Fig. 4). Look in the mirror to check if you are placing the pouch, or wafer, in the right position. For a 2-piece system, snap the pouch onto the wafer. Make sure it snaps into place securely.  Place your hand over the stoma and the pouch or wafer for about 30 seconds. The heat from your hand can help the pouch or wafer stick to your skin.  Add deodorant (such as Super Banish or Nullo) to your pouch. Other options include food extracts such as vanilla oil and peppermint extract. Add about 10 drops of the deodorant to the pouch. Then apply the closure clamp. Note: Do not use toxic  chemicals or commercial cleaning agents in your pouch. These substances may harm the stoma.  Optional: For extra seal, apply tape to all 4 sides around the pouch or wafer, as if you were framing a picture. You may use any brand of medical adhesive tape. Change your pouch every 5 to 7 days. Change it immediately if a leak occurs.  Wash your hands afterwards.  If you are wearing a 2-piece system, you may use 2 new pouches per week and alternate them. Rinse the pouch with mild soap and warm water and hang it to dry for the next day. Apply the fresh pouch. Alternate the 2 pouches like this for a week. After a week, change the wafer and begin with 2 new pouches. Place the old pouches in a plastic bag, and put them in the trash.   LIVING WITH AN OSTOMY  Emptying Your Pouch Empty your pouch when it is one-third full (of urine, stool, and/or gas). If you wait until your pouch is fuller than this, it will be more difficult to empty and more noticeable. When you empty your pouch, either put toilet paper in the toilet bowl first, or flush the toilet while you empty the pouch. This will reduce splashing. You can empty the pouch between your legs or to one side while sitting, or while standing or stooping. If you have a 2-piece system, you can snap off the pouch to empty it. Remember that your stoma may function during  this time. If you wish to rinse your pouch after you empty it, a turkey baster can be helpful. When using a baster, squirt water up into the pouch through the opening at the bottom. With a 2-piece system,   you can snap off the pouch to rinse it. After rinsing  your pouch, empty it into the toilet. When rinsing your pouch at home, put a few granules of Dreft soap in the rinse water. This helps lubricate and freshen your pouch. The inside of your pouch can be sprayed with non-stick cooking oil (Pam spray). This may help reduce stool sticking to the inside of the pouch.  Bathing You may shower or bathe with your pouch on or off. Remember that your stoma may function during this time.  The materials you use to wash your stoma and the skin around it should be clean, but they do not need to be sterile.  Wearing Your Pouch During hot weather, or if you perspire a lot in general, wear a cover over your pouch. This may prevent a rash on your skin under the pouch. Pouch covers are sold at ostomy supply stores. Wear the pouch inside your underwear for better support. Watch your weight. Any gain or loss of 10 to 15 pounds or more can change the way your pouch fits.  Going Away From Home A collapsible cup (like those that come in travel kits) or a soft plastic squirt bottle with a pull-up top (like a travel bottle for shampoo) can be used for rinsing your pouch when you are away from home. Tilt the opening of the pouch at an upward angle when using a cup to rinse.  Carry wet wipes or extra tissues to use in public bathrooms.  Carry an extra pouching system with you at all times.  Never keep ostomy supplies in the glove compartment of your car. Extreme heat or cold can damage the skin barriers and adhesive wafers on the pouch.  When you travel, carry your ostomy supplies with you at all times. Keep them within easy reach. Do not pack ostomy supplies in baggage that will be checked or otherwise separated  from you, because your baggage might be lost. If you're traveling out of the country, it is helpful to have a letter stating that you are carrying ostomy supplies as a medical necessity.  If you need ostomy supplies while traveling, look in the yellow pages of the telephone book under "Surgical Supplies." Or call the local ostomy organization to find out where supplies are available.  Do not let your ostomy supplies get low. Always order new pouches before you use the last one.  Reducing Odor Limit foods such as broccoli, cabbage, onions, fish, and garlic in your diet to help reduce odor. Each time you empty your pouch, carefully clean the opening of the pouch, both inside and outside, with toilet paper. Rinse your pouch 1 or 2 times daily after you empty it (see directions for emptying your pouch and going away from home). Add deodorant (such as Super Banish or Nullo) to your pouch. Use air deodorizers in your bathroom. Do not add aspirin to your pouch. Even though aspirin can help prevent odor, it could cause ulcers on your stoma.  When to call the doctor Call the doctor if you have any of the following symptoms: Purple, black, or white stoma Severe cramps lasting more than 6 hours Severe watery discharge from the stoma lasting more than 6 hours No output from the colostomy for 3 days Excessive bleeding from your stoma Swelling of your stoma to more than 1/2-inch larger than usual Pulling inward of your stoma below skin level Severe skin irritation or deep ulcers Bulging or other changes in your abdomen    When to call your ostomy nurse Call your ostomy/enterostomal therapy (WOCN) nurse if any of the following occurs: Frequent leaking of your pouching system Change in size or appearance of your stoma, causing discomfort or problems with your pouch Skin rash or rawness Weight gain or loss that causes problems with your pouch     FREQUENTLY ASKED QUESTIONS   Why haven't you met  any of these folks who have an ostomy?  Well, maybe you have! You just did not recognize them because an ostomy doesn't show. It can be kept secret if you wish. Why, maybe some of your best friends, office associates or neighbors have an ostomy ... you never can tell. People facing ostomy surgery have many quality-of-life questions like: Will you bulge? Smell? Make noises? Will you feel waste leaving your body? Will you be a captive of the toilet? Will you starve? Be a social outcast? Get/stay married? Have babies? Easily bathe, go swimming, bend over?  OK, let's look at what you can expect:   Will you bulge?  Remember, without part of the intestine or bladder, and its contents, you should have a flatter tummy than before. You can expect to wear, with little exception, what you wore before surgery ... and this in-cludes tight clothing and bathing suits.   Will you smell?  Today, thanks to modern odor proof pouching systems, you can walk into an ostomy support group meeting and not smell anything that is foul or offensive. And, for those with an ileostomy or colostomy who are concerned about odor when emptying their pouch, there are in-pouch deodorants that can be used to eliminate any waste odors that may exist.   Will you make noises?  Everyone produces gas, especially if they are an air-swallower. But intestinal sounds that occur from time to time are no differ-ent than a gurgling tummy, and quite often your clothing will muffle any sounds.   Will you feel the waste discharges?  For those with a colostomy or ileostomy there might be a slight pressure when waste leaves your body, but understand that the intestines have no nerve endings, so there will be no unpleasant sensations. Those with a urostomy will probably be unaware of any kidney drainage.   Will you be a captive of the toilet?  Immediately post-op you will spend more time in the bathroom than you will after your body recovers from  surgery. Every person is different, but on average those with an ileostomy or urostomy may empty their pouches 4 to 6 times a day; a little  less if you have a colostomy. The average wear time between pouch system changes is 3 to 5 days and the changing process should take less than 30 minutes.   Will I need to be on a special diet? Most people return to their normal diet when they have recovered from surgery. Be sure to chew your food well, eat a well-balanced diet and drink plenty of fluids. If you experience problems with a certain food, wait a couple of weeks and try it again.  Will there be odor and noises? Pouching systems are designed to be odor-proof or odor-resistant. There are deodorants that can be used in the pouch. Medications are also available to help reduce odor. Limit gas-producing foods and carbonated beverages. You will experience less gas and fewer noises as you heal from surgery.  How much time will it take to care for my ostomy? At first, you may spend a lot of time learning   about your ostomy and how to take care of it. As you become more comfortable and skilled at changing the pouching system, it will take very little time to care for it.   Will I be able to return to work? People with ostomies can perform most jobs. As soon as you have healed from surgery, you should be able to return to work. Heavy lifting (more than 10 pounds) may be discouraged.   What about intimacy? Sexual relationships and intimacy are important and fulfilling aspects of your life. They should continue after ostomy surgery. Intimacy-related concerns should be discussed openly between you and your partner.   Can I wear regular clothing? You do not need to wear special clothing. Ostomy pouches are fairly flat and barely noticeable. Elastic undergarments will not hurt the stoma or prevent the ostomy from functioning.   Can I participate in sports? An ostomy should not limit your involvement in sports.  Many people with ostomies are runners, skiers, swimmers or participate in other active lifestyles. Talk with your caregiver first before doing heavy physical activity.  Will you starve?  Not if you follow doctor's orders at each stage of your post-op adjustment. There is no such thing as an "ostomy diet". Some people with an ostomy will be able to eat and tolerate anything; others may find diffi-culty with some foods. Each person is an individual and must determine, by trial, what is best for them. A good practice for all is to drink plenty of water.   Will you be a social outcast?  Have you met anyone who has an ostomy and is a social outcast? Why should you be the first? Only your attitude and self image will effect how you are treated. No confi-dent person is an outcast.    PROFESSIONAL HELP   Resources are available if you need help or have questions about your ostomy.   Specially trained nurses called Wound, Ostomy Continence Nurses (WOCN) are available for consultation in most major medical centers.  Consider getting an ostomy consult at an outpatient ostomy clinic.   [place] has an Ostomy Clinic run by an WOCN ostomy nurse at the [facility].  [phone]. [facility] can help set up an appointment   The United Ostomy Association (UOA) is a group made up of many local chapters throughout the [place]. These local groups hold meetings and provide support to prospective and existing ostomates. They sponsor educational events and have qualified visitors to make personal or telephone visits. Contact the UOA for the chapter nearest you and for other educational publications.  More detailed information can be found in Colostomy Guide, a publication of the United Ostomy Association (UOA). Contact UOA at 1-800-826-0826 or visit their web site at www.uoaa.org. The website contains links to other sites, suppliers and resources.  [facility] Secure Start  Services: Start at the website to enlist for support.  Your Wound Ostomy (WOCN) nurse may have started this process. https://[email or web address].com/en/securestart Secure Start services are designed to support people as they live their lives with an ostomy or neurogenic bladder. Enrolling is easy and at no cost to the patient. We realize that each person's needs and life journey are different. Through Secure Start services, we want to help people live their life, their way.  #######################################################   Managing Your Pain After Surgery Without Opioids    Thank you for participating in our program to help patients manage their pain after surgery without opioids. This is part of   our effort to provide you with the best care possible, without exposing you or your family to the risk that opioids pose.  What pain can I expect after surgery? You can expect to have some pain after surgery. This is normal. The pain is typically worse the day after surgery, and quickly begins to get better. Many studies have found that many patients are able to manage their pain after surgery with Over-the-Counter (OTC) medications such as Tylenol and Motrin. If you have a condition that does not allow you to take Tylenol or Motrin, notify your surgical team.  How will I manage my pain? The best strategy for controlling your pain after surgery is around the clock pain control with Tylenol (acetaminophen) and Motrin (ibuprofen or Advil). Alternating these medications with each other allows you to maximize your pain control. In addition to Tylenol and Motrin, you can use heating pads or ice packs on your incisions to help reduce your pain.  How will I alternate your regular strength over-the-counter pain medication? You will take a dose of pain medication every three hours. Start by taking 650 mg of Tylenol (2 pills of 325 mg) 3 hours later take 600 mg of Motrin (3 pills of 200 mg) 3 hours after  taking the Motrin take 650 mg of Tylenol 3 hours after that take 600 mg of Motrin.   - 1 -  See example - if your first dose of Tylenol is at 12:00 PM   12:00 PM Tylenol 650 mg (2 pills of 325 mg)  3:00 PM Motrin 600 mg (3 pills of 200 mg)  6:00 PM Tylenol 650 mg (2 pills of 325 mg)  9:00 PM Motrin 600 mg (3 pills of 200 mg)  Continue alternating every 3 hours   We recommend that you follow this schedule around-the-clock for at least 3 days after surgery, or until you feel that it is no longer needed. Use the table on the last page of this handout to keep track of the medications you are taking. Important: Do not take more than 3000mg of Tylenol or 3200mg of Motrin in a 24-hour period. Do not take ibuprofen/Motrin if you have a history of bleeding stomach ulcers, severe kidney disease, &/or actively taking a blood thinner  What if I still have pain? If you have pain that is not controlled with the over-the-counter pain medications (Tylenol and Motrin or Advil) you might have what we call "breakthrough" pain. You will receive a prescription for a small amount of an opioid pain medication such as Oxycodone, Tramadol, or Tylenol with Codeine. Use these opioid pills in the first 24 hours after surgery if you have breakthrough pain. Do not take more than 1 pill every 4-6 hours.  If you still have uncontrolled pain after using all opioid pills, don't hesitate to call our staff using the number provided. We will help make sure you are managing your pain in the best way possible, and if necessary, we can provide a prescription for additional pain medication.   Day 1    Time  Name of Medication Number of pills taken  Amount of Acetaminophen  Pain Level   Comments  AM PM       AM PM       AM PM       AM PM       AM PM       AM PM       AM PM         AM PM       Total Daily amount of Acetaminophen Do not take more than  3,000 mg per day      Day 2    Time  Name of Medication  Number of pills taken  Amount of Acetaminophen  Pain Level   Comments  AM PM       AM PM       AM PM       AM PM       AM PM       AM PM       AM PM       AM PM       Total Daily amount of Acetaminophen Do not take more than  3,000 mg per day      Day 3    Time  Name of Medication Number of pills taken  Amount of Acetaminophen  Pain Level   Comments  AM PM       AM PM       AM PM       AM PM         AM PM       AM PM       AM PM       AM PM       Total Daily amount of Acetaminophen Do not take more than  3,000 mg per day      Day 4    Time  Name of Medication Number of pills taken  Amount of Acetaminophen  Pain Level   Comments  AM PM       AM PM       AM PM       AM PM       AM PM       AM PM       AM PM       AM PM       Total Daily amount of Acetaminophen Do not take more than  3,000 mg per day      Day 5    Time  Name of Medication Number of pills taken  Amount of Acetaminophen  Pain Level   Comments  AM PM       AM PM       AM PM       AM PM       AM PM       AM PM       AM PM       AM PM       Total Daily amount of Acetaminophen Do not take more than  3,000 mg per day      Day 6    Time  Name of Medication Number of pills taken  Amount of Acetaminophen  Pain Level  Comments  AM PM       AM PM       AM PM       AM PM       AM PM       AM PM       AM PM       AM PM       Total Daily amount of Acetaminophen Do not take more than  3,000 mg per day      Day 7    Time  Name of Medication Number of pills taken  Amount of Acetaminophen  Pain Level   Comments    AM PM       AM PM       AM PM       AM PM       AM PM       AM PM       AM PM       AM PM       Total Daily amount of Acetaminophen Do not take more than  3,000 mg per day        For additional information about how and where to safely dispose of unused opioid medications - https://www.morepowerfulnc.org  Disclaimer: This document contains  information and/or instructional materials adapted from [facility] for the typical patient with your condition. It does not replace medical advice from your health care provider because your experience may differ from that of the typical patient. Talk to your health care provider if you have any questions about this document, your condition or your treatment plan. Adapted from [facility]  

## 2023-02-07 NOTE — Progress Notes (Signed)
Nutrition Follow-up  DOCUMENTATION CODES:   Non-severe (moderate) malnutrition in context of chronic illness  INTERVENTION:   -[employer] 1.4 PO BID, each provides 455 kcals and 20g protein  NUTRITION DIAGNOSIS:   Moderate Malnutrition related to chronic illness, altered GI function (Crohn's disease with multiple SBOs and GI surgeries) as evidenced by mild fat depletion, moderate muscle depletion, percent weight loss.  Ongoing.  GOAL:   Patient will meet greater than or equal to 90% of their needs  Progressing  MONITOR:   Labs, Weight trends, I & O's (TPN), PO intake, supplement acceptance  ASSESSMENT:   52 y.o. 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 She returned to the emergency department 01/24/23 with complaints of abdominal distention, abdominal pain, nausea and multiple episodes of emesis over the last 2 weeks.Admitted for high grade SBO.  6/12: admitted 6/16: TPN initiated 6/17: s/p ex lap with lysis of adhesion, resection of ileocolic anastomosis, new ileocolic anastomosis  6/19 increased to goal TPN of 64mL/hr 6/21: CLD 6/23: FLD 6/24: Soft diet  TPN discontinued today. Now on soft diet. Given malnutrition would benefit from protein supplements. Pt did not tolerate Ensure well prior to admission. Pt has refused all Ensure that was ordered so far. Will trial plant based protein shake to see if this is better tolerated.  Admission weight:  115 lbs Current weight: 140 lbs  Medications: Miralax, Senokot,  Labs reviewed: CBGs: 100-130   Diet Order:   Diet Order             DIET SOFT Room service appropriate? Yes; Fluid consistency: Thin  Diet effective now                   EDUCATION NEEDS:   Not appropriate for education at this time  Skin:  Skin Assessment: Skin Integrity Issues: Skin Integrity Issues:: Incisions Incisions: Abdomen  Last BM:  6/25 -type 6  Height:   Ht Readings from Last 1 Encounters:  02/01/23 5\' 2"  (1.575 m)    Weight:   Wt Readings from Last 1 Encounters:  02/07/23 63.9 kg    BMI:  Body mass index is 25.77 kg/m.  Estimated Nutritional Needs:   Kcal:  1800-2000  Protein:  90-105g  Fluid:  2L/day  Tilda Franco, MS, RD, LDN Inpatient Clinical Dietitian Contact information available via Am[employer]

## 2023-02-08 ENCOUNTER — Other Ambulatory Visit: Payer: Self-pay

## 2023-02-08 ENCOUNTER — Emergency Department (HOSPITAL_COMMUNITY): Payer: Medicaid Other

## 2023-02-08 ENCOUNTER — Inpatient Hospital Stay (HOSPITAL_COMMUNITY)
Admission: EM | Admit: 2023-02-08 | Discharge: 2023-02-15 | Disposition: A | Discharge status: Skilled Nursing Facility | Payer: Medicaid Other | Attending: Internal Medicine | Admitting: Internal Medicine

## 2023-02-08 DIAGNOSIS — E871 Hypo-osmolality and hyponatremia: Secondary | ICD-10-CM | POA: Diagnosis not present

## 2023-02-08 DIAGNOSIS — K5000 Crohn's disease of small intestine without complications: Secondary | ICD-10-CM | POA: Diagnosis present

## 2023-02-08 DIAGNOSIS — D696 Thrombocytopenia, unspecified: Secondary | ICD-10-CM | POA: Diagnosis present

## 2023-02-08 DIAGNOSIS — F17210 Nicotine dependence, cigarettes, uncomplicated: Secondary | ICD-10-CM | POA: Diagnosis present

## 2023-02-08 DIAGNOSIS — Z86718 Personal history of other venous thrombosis and embolism: Secondary | ICD-10-CM

## 2023-02-08 DIAGNOSIS — I050 Rheumatic mitral stenosis: Secondary | ICD-10-CM | POA: Diagnosis present

## 2023-02-08 DIAGNOSIS — Z91148 Patient's other noncompliance with medication regimen for other reason: Secondary | ICD-10-CM

## 2023-02-08 DIAGNOSIS — R109 Unspecified abdominal pain: Secondary | ICD-10-CM | POA: Diagnosis not present

## 2023-02-08 DIAGNOSIS — J189 Pneumonia, unspecified organism: Secondary | ICD-10-CM | POA: Diagnosis not present

## 2023-02-08 DIAGNOSIS — E876 Hypokalemia: Secondary | ICD-10-CM | POA: Diagnosis not present

## 2023-02-08 DIAGNOSIS — D75839 Thrombocytosis, unspecified: Secondary | ICD-10-CM | POA: Diagnosis present

## 2023-02-08 DIAGNOSIS — E440 Moderate protein-calorie malnutrition: Secondary | ICD-10-CM | POA: Diagnosis present

## 2023-02-08 DIAGNOSIS — Z91040 Latex allergy status: Secondary | ICD-10-CM

## 2023-02-08 DIAGNOSIS — B370 Candidal stomatitis: Secondary | ICD-10-CM | POA: Diagnosis present

## 2023-02-08 DIAGNOSIS — F17200 Nicotine dependence, unspecified, uncomplicated: Secondary | ICD-10-CM | POA: Diagnosis present

## 2023-02-08 DIAGNOSIS — Z86711 Personal history of pulmonary embolism: Secondary | ICD-10-CM

## 2023-02-08 DIAGNOSIS — Z91048 Other nonmedicinal substance allergy status: Secondary | ICD-10-CM

## 2023-02-08 DIAGNOSIS — J90 Pleural effusion, not elsewhere classified: Secondary | ICD-10-CM | POA: Diagnosis present

## 2023-02-08 DIAGNOSIS — D649 Anemia, unspecified: Secondary | ICD-10-CM | POA: Diagnosis present

## 2023-02-08 DIAGNOSIS — I69354 Hemiplegia and hemiparesis following cerebral infarction affecting left non-dominant side: Secondary | ICD-10-CM

## 2023-02-08 DIAGNOSIS — I10 Essential (primary) hypertension: Secondary | ICD-10-CM | POA: Diagnosis present

## 2023-02-08 DIAGNOSIS — Z79899 Other long term (current) drug therapy: Secondary | ICD-10-CM

## 2023-02-08 DIAGNOSIS — R0902 Hypoxemia: Secondary | ICD-10-CM | POA: Diagnosis not present

## 2023-02-08 DIAGNOSIS — K5090 Crohn's disease, unspecified, without complications: Secondary | ICD-10-CM | POA: Diagnosis present

## 2023-02-08 DIAGNOSIS — R1084 Generalized abdominal pain: Principal | ICD-10-CM | POA: Diagnosis present

## 2023-02-08 DIAGNOSIS — Z7901 Long term (current) use of anticoagulants: Secondary | ICD-10-CM

## 2023-02-08 DIAGNOSIS — Z888 Allergy status to other drugs, medicaments and biological substances status: Secondary | ICD-10-CM

## 2023-02-08 DIAGNOSIS — G9341 Metabolic encephalopathy: Secondary | ICD-10-CM | POA: Diagnosis not present

## 2023-02-08 DIAGNOSIS — Z6825 Body mass index (BMI) 25.0-25.9, adult: Secondary | ICD-10-CM

## 2023-02-08 DIAGNOSIS — F411 Generalized anxiety disorder: Secondary | ICD-10-CM | POA: Diagnosis present

## 2023-02-08 DIAGNOSIS — G8194 Hemiplegia, unspecified affecting left nondominant side: Secondary | ICD-10-CM | POA: Diagnosis present

## 2023-02-08 LAB — COMPREHENSIVE METABOLIC PANEL
ALT: 38 U/L (ref 0–44)
AST: 28 U/L (ref 15–41)
Albumin: 2.9 g/dL — ABNORMAL LOW (ref 3.5–5.0)
Alkaline Phosphatase: 142 U/L — ABNORMAL HIGH (ref 38–126)
Anion gap: 12 (ref 5–15)
BUN: 11 mg/dL (ref 6–20)
CO2: 23 mmol/L (ref 22–32)
Calcium: 8.7 mg/dL — ABNORMAL LOW (ref 8.9–10.3)
Chloride: 100 mmol/L (ref 98–111)
Creatinine, Ser: 0.68 mg/dL (ref 0.44–1.00)
GFR, Estimated: >60 mL/min (ref >60)
Glucose, Bld: 98 mg/dL (ref 70–99)
Potassium: 3.8 mmol/L (ref 3.5–5.1)
Sodium: 135 mmol/L (ref 135–145)
Total Bilirubin: 0.9 mg/dL (ref 0.3–1.2)
Total Protein: 7.5 g/dL (ref 6.5–8.1)

## 2023-02-08 LAB — CBC WITH DIFFERENTIAL/PLATELET
Abs Immature Granulocytes: 0.26 10*3/uL — ABNORMAL HIGH (ref 0.00–0.07)
Basophils Absolute: 0.1 10*3/uL (ref 0.0–0.1)
Basophils Relative: 0 %
Eosinophils Absolute: 0.1 10*3/uL (ref 0.0–0.5)
Eosinophils Relative: 0 %
HCT: 34.4 % — ABNORMAL LOW (ref 36.0–46.0)
Hemoglobin: 10.9 g/dL — ABNORMAL LOW (ref 12.0–15.0)
Immature Granulocytes: 2 %
Lymphocytes Relative: 7 %
Lymphs Abs: 1 10*3/uL (ref 0.7–4.0)
MCH: 27.9 pg (ref 26.0–34.0)
MCHC: 31.7 g/dL (ref 30.0–36.0)
MCV: 88.2 fL (ref 80.0–100.0)
Monocytes Absolute: 0.6 10*3/uL (ref 0.1–1.0)
Monocytes Relative: 4 %
Neutro Abs: 12.5 10*3/uL — ABNORMAL HIGH (ref 1.7–7.7)
Neutrophils Relative %: 87 %
Platelets: 457 10*3/uL — ABNORMAL HIGH (ref 150–400)
RBC: 3.9 MIL/uL (ref 3.87–5.11)
RDW: 19.4 % — ABNORMAL HIGH (ref 11.5–15.5)
WBC: 14.5 10*3/uL — ABNORMAL HIGH (ref 4.0–10.5)
nRBC: 0 % (ref 0.0–0.2)

## 2023-02-08 LAB — URINALYSIS, ROUTINE W REFLEX MICROSCOPIC
Bacteria, UA: NONE SEEN
Bilirubin Urine: NEGATIVE
Glucose, UA: NEGATIVE mg/dL
Hgb urine dipstick: NEGATIVE
Ketones, ur: 5 mg/dL — AB
Leukocytes,Ua: NEGATIVE
Nitrite: NEGATIVE
Protein, ur: 30 mg/dL — AB
Specific Gravity, Urine: 1.009 (ref 1.005–1.030)
pH: 8 (ref 5.0–8.0)

## 2023-02-08 MED ORDER — METOPROLOL TARTRATE 25 MG PO TABS
50.0000 mg | ORAL_TABLET | Freq: Two times a day (BID) | ORAL | Status: DC
  Administered 2023-02-08 – 2023-02-15 (×14): 50 mg via ORAL
  Filled 2023-02-08 (×14): qty 2

## 2023-02-08 MED ORDER — SODIUM CHLORIDE 0.9 % IV BOLUS
1000.0000 mL | Freq: Once | INTRAVENOUS | Status: AC
  Administered 2023-02-08: 1000 mL via INTRAVENOUS

## 2023-02-08 MED ORDER — POTASSIUM CHLORIDE IN NACL 20-0.9 MEQ/L-% IV SOLN
INTRAVENOUS | Status: DC
  Filled 2023-02-08 (×2): qty 1000

## 2023-02-08 MED ORDER — PANTOPRAZOLE SODIUM 40 MG IV SOLR
40.0000 mg | INTRAVENOUS | Status: DC
  Administered 2023-02-08 – 2023-02-09 (×2): 40 mg via INTRAVENOUS
  Filled 2023-02-08 (×2): qty 10

## 2023-02-08 MED ORDER — ONDANSETRON HCL 4 MG PO TABS
4.0000 mg | ORAL_TABLET | Freq: Four times a day (QID) | ORAL | Status: DC | PRN
  Administered 2023-02-10 – 2023-02-15 (×2): 4 mg via ORAL
  Filled 2023-02-08 (×2): qty 1

## 2023-02-08 MED ORDER — IOHEXOL 300 MG/ML  SOLN
100.0000 mL | Freq: Once | INTRAMUSCULAR | Status: AC | PRN
  Administered 2023-02-08: 100 mL via INTRAVENOUS

## 2023-02-08 MED ORDER — ONDANSETRON HCL 4 MG/2ML IJ SOLN
4.0000 mg | Freq: Four times a day (QID) | INTRAMUSCULAR | Status: DC | PRN
  Administered 2023-02-08 – 2023-02-11 (×7): 4 mg via INTRAVENOUS
  Filled 2023-02-08 (×7): qty 2

## 2023-02-08 MED ORDER — HYDROMORPHONE HCL 1 MG/ML IJ SOLN
1.0000 mg | INTRAMUSCULAR | Status: DC | PRN
  Administered 2023-02-08 – 2023-02-09 (×4): 1 mg via INTRAVENOUS
  Filled 2023-02-08 (×4): qty 1

## 2023-02-08 MED ORDER — ACETAMINOPHEN 650 MG RE SUPP: 650.0000 mg | Freq: Four times a day (QID) | RECTAL | Status: DC | PRN

## 2023-02-08 MED ORDER — ONDANSETRON HCL 4 MG/2ML IJ SOLN
4.0000 mg | Freq: Once | INTRAMUSCULAR | Status: AC
  Administered 2023-02-08: 4 mg via INTRAVENOUS
  Filled 2023-02-08: qty 2

## 2023-02-08 MED ORDER — IOHEXOL 9 MG/ML PO SOLN
500.0000 mL | ORAL | Status: AC
  Administered 2023-02-08 (×2): 500 mL via ORAL

## 2023-02-08 MED ORDER — MORPHINE SULFATE (PF) 4 MG/ML IV SOLN
4.0000 mg | Freq: Once | INTRAVENOUS | Status: AC
  Administered 2023-02-08: 4 mg via INTRAVENOUS
  Filled 2023-02-08: qty 1

## 2023-02-08 MED ORDER — SODIUM CHLORIDE (PF) 0.9 % IJ SOLN
INTRAMUSCULAR | Status: AC
  Filled 2023-02-08: qty 50

## 2023-02-08 MED ORDER — ACETAMINOPHEN 325 MG PO TABS
650.0000 mg | ORAL_TABLET | Freq: Four times a day (QID) | ORAL | Status: DC | PRN
  Administered 2023-02-11 – 2023-02-12 (×2): 650 mg via ORAL
  Filled 2023-02-08 (×2): qty 2

## 2023-02-08 MED ORDER — IOHEXOL 9 MG/ML PO SOLN
ORAL | Status: AC
  Filled 2023-02-08: qty 1000

## 2023-02-08 NOTE — ED Notes (Signed)
Pt was assisted to use bedpan. Pt voided.

## 2023-02-08 NOTE — H&P (Signed)
History and Physical    Patient: Johnathan Tortorelli WUJ:[id number] DOB: 04-23-1971 DOA: 02/08/2023 DOS: the patient was seen and examined on 02/08/2023 PCP: Pcp, No  Patient coming from: Home  Chief Complaint:  Chief Complaint  Patient presents with   Abdominal Pain   Nausea   HPI: Romell Cavanah is a 52 y.o. 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  She returned to the emergency department complaints of abdominal distention, abdominal pain, nausea and multiple episodes of emesis in the setting of SBO.  The patient was ultimately taken to the OR undergoing exploratory laparotomy, LOA, resection of ileocolic anastomosis with new ileocolic anastomosis for sbo 2/2 strictured ileocolic anastomosis by Dr. Magnus Ivan 01/29/23.  She was discharged yesterday, but returns today due to abdominal pain and nausea/vomiting. No diarrhea, constipation, melena or hematochezia.  No flank pain, dysuria, frequency or hematuria. No fever, chills or night sweats. No sore throat, rhinorrhea, dyspnea, wheezing or hemoptysis.  No chest pain, palpitations, diaphoresis, PND, orthopnea or pitting edema of the lower extremities.  No polyuria, polydipsia, polyphagia or blurred vision.  Lab work: Her urinalysis showed ketones of 5 and protein of 30 mg/dL.  CBC 0 white count 14.5, hemoglobin 10.9 g/dL and platelets 956.  CMP showed an albumin of 2.9 g/dL and alkaline phosphatase of 142 units/L.  CMP  otherwise with normal electrolytes after calcium correction, normal glucose, renal function, total protein 8, transaminases and total bilirubin.  Imaging: 1 view abdominal x-ray with nonobstructive bowel gas pattern.  No pneumoperitoneum.  Postop changes and support apparatus.  CT abdomen and pelvis with stable surgical changes from recent ileocolonic anastomosis.  No obstructive findings.  Scattered free fluid in the abdomen and pelvis.  No findings for intra-abdominal abscess.  Persistent bilateral pleural effusions and overlying atelectasis.   ED course: Initial vital signs were temperature 98.8 F, pulse 115, respirations 16, BP 171/115 mmHg O2 sat 100% on room air.  The patient received morphine 4 mg IVP, ondansetron 4 mg IVP and 1000 mL normal saline bolus.  Review of Systems: As mentioned in the history of present illness. All other systems reviewed and are negative. No past medical history on file. Past Surgical History:  Procedure Laterality Date   LAPAROTOMY N/A 01/29/2023   Procedure: EXPLORATORY LAPAROTOMY; LYSIS OF ADHESIONS, RESECTION OF ILEOCOLIC ANASTOMOSIS, NEW ILEOCOLIC ANASTOMOSIS;  Surgeon: Abigail Miyamoto, MD;  Location: [facility];  Service: General;  Laterality: N/A;   Social History:  reports that she has been smoking cigarettes. She has been smoking an average of .5 packs per day. She does not have any smokeless tobacco history on file. No history on file for alcohol use and drug use.  Allergies  Allergen Reactions   Latex     No family history on file.  Prior to Admission medications   Medication Sig Start Date End Date Taking? Authorizing Provider  methocarbamol (ROBAXIN) 750 MG tablet Take 1 tablet (750 mg total) by mouth every 8 (eight) hours as needed for muscle spasms. 02/07/23 03/09/23  Leeroy Bock, MD  metoprolol tartrate (LOPRESSOR) 50 MG tablet  Take 1 tablet (50 mg total) by mouth 2 (two) times daily. 02/07/23 03/09/23  Leeroy Bock, MD   mirtazapine (REMERON) 15 MG tablet Take 15 mg by mouth at bedtime. 08/09/22   [provider]  oxyCODONE (OXY IR/ROXICODONE) 5 MG immediate release tablet Take 1 tablet (5 mg) by mouth every 6 hours as needed for severe or moderate pain (1 tablet for moderate pain, 2 tablets for severe pain). 02/07/23 02/10/23  Leeroy Bock, MD  polyethylene glycol powder (GLYCOLAX/MIRALAX) 17 GM/SCOOP powder Mix 17 g in 4 oz of water or juice and take by mouth 2 (two) times daily. 02/07/23   Leeroy Bock, MD  senna (SENOKOT) 8.6 MG TABS tablet Take 1 tablet (8.6 mg total) by mouth daily. 02/07/23 03/09/23  Leeroy Bock, MD    Physical Exam: Vitals:   02/08/23 0947 02/08/23 1310 02/08/23 1314 02/08/23 1315  BP: (!) 164/97 (!) 157/102  (!) 148/99  Pulse: (!) 105 (!) 105  (!) 111  Resp: 16  16 16   Temp:    98.4 F (36.9 C)  TempSrc:    Oral  SpO2: 100% 100%  100%   Physical Exam Vitals reviewed.  Constitutional:      General: She is awake. She is not in acute distress.    Appearance: She is well-developed.  HENT:     Head: Normocephalic.     Nose: No rhinorrhea.     Mouth/Throat:     Mouth: Mucous membranes are dry.  Eyes:     General: No scleral icterus.    Pupils: Pupils are equal, round, and reactive to light.  Neck:     Vascular: No JVD.  Cardiovascular:     Rate and Rhythm: Normal rate and regular rhythm.     Heart sounds: S1 normal and S2 normal.  Pulmonary:     Effort: Pulmonary effort is normal.     Breath sounds: Normal breath sounds.  Abdominal:     General: Bowel sounds are normal.     Palpations: Abdomen is soft.     Tenderness: There is generalized abdominal tenderness. There is no right CVA tenderness, left CVA tenderness or guarding.  Musculoskeletal:     Cervical back: Neck supple.     Right lower leg: No edema.     Left lower leg: No edema.  Skin:    General: Skin is warm and dry.  Neurological:     General: No focal deficit present.      Mental Status: She is alert and oriented to person, place, and time.  Psychiatric:        Mood and Affect: Mood normal.        Behavior: Behavior normal. Behavior is cooperative.    Data Reviewed:  Results are pending, will review when available.  Assessment and Plan: Principal Problem:   Intractable abdominal pain Observation/MedSurg. Continue IV fluids. Keep n.p.o. for now. Clear liquid diet after CT. Analgesics as needed. Antiemetics as needed. Pantoprazole 40 mg IVP daily. Follow CBC, CMP in AM. General surgery consult appreciated.  Active Problems:   Crohn's disease (HCC) Consider GI evaluation if no improvement.     Essential hypertension Parenteral antihypertensives as needed.     Left hemiparesis (HCC) Supportive care. Consult physical therapy in AM.     Pulmonary embolism on right [facility]) And history of DVT. Has not been taking apixaban. Will begin enoxaparin SQ twice daily.     Tobacco use disorder Tobacco cessation advised. Nicotine replacement therapy  ordered.    Malnutrition of moderate degree Protein supplementation.   Consider nutritional services evaluation.    Normocytic anemia In the setting of recent surgery. Monitor hematocrit and hemoglobin.    Thrombocytosis Monitor platelet count.     Advance Care Planning:   Code Status: Full Code   Consults: [facility].  Family Communication:   Severity of Illness: The appropriate patient status for this patient is OBSERVATION. Observation status is judged to be reasonable and necessary in order to provide the required intensity of service to ensure the patient's safety. The patient's presenting symptoms, physical exam findings, and initial radiographic and laboratory data in the context of their medical condition is felt to place them at decreased risk for further clinical deterioration. Furthermore, it is anticipated that the patient will be medically stable for discharge from the  hospital within 2 midnights of admission.   Author: Bobette Mo, MD 02/08/2023 1:46 PM  For on call review [email or web address].   This document was prepared using Dragon voice recognition software and may contain some unintended transcription errors.

## 2023-02-08 NOTE — ED Notes (Signed)
Hospitalist at bedside 

## 2023-02-08 NOTE — ED Provider Notes (Signed)
[place] EMERGENCY DEPARTMENT AT [facility] Provider Note   CSN: [id number] Arrival date & time: 02/08/23  0502     History  Chief Complaint  Patient presents with   Abdominal Pain   Nausea    Tara Berger is a 52 y.o. female.  HPI     This is a 52 year old female with a history of Crohn's disease, bowel obstruction with recent ex lap and resection of prior ileocolic anastomosis and reanastomosis who presents with abdominal pain, nausea, vomiting.  Patient was discharged from the hospital yesterday after her ex lap surgery.  She states that since that time she has had ongoing nausea and vomiting.  She is passing gas and has had 1 very loose stool.  She is reporting abdominal pain.  No fevers.  Reports that she has been taking nausea medication at home without relief.  Home Medications Prior to Admission medications   Medication Sig Start Date End Date Taking? Authorizing Provider  methocarbamol (ROBAXIN) 750 MG tablet Take 1 tablet (750 mg total) by mouth every 8 (eight) hours as needed for muscle spasms. 02/07/23 03/09/23  Leeroy Bock, MD  metoprolol tartrate (LOPRESSOR) 50 MG tablet Take 1 tablet (50 mg total) by mouth 2 (two) times daily. 02/07/23 03/09/23  Leeroy Bock, MD  mirtazapine (REMERON) 15 MG tablet Take 15 mg by mouth at bedtime. 08/09/22   [provider]  oxyCODONE (OXY IR/ROXICODONE) 5 MG immediate release tablet Take 1 tablet (5 mg) by mouth every 6 hours as needed for severe or moderate pain (1 tablet for moderate pain, 2 tablets for severe pain). 02/07/23 02/10/23  Leeroy Bock, MD  polyethylene glycol powder (GLYCOLAX/MIRALAX) 17 GM/SCOOP powder Mix 17 g in 4 oz of water or juice and take by mouth 2 (two) times daily. 02/07/23   Leeroy Bock, MD  senna (SENOKOT) 8.6 MG TABS tablet Take 1 tablet (8.6 mg total) by mouth daily. 02/07/23 03/09/23  Leeroy Bock, MD      Allergies    Latex    Review of Systems    Review of Systems  Constitutional:  Negative for fever.  Gastrointestinal:  Positive for abdominal pain, diarrhea, nausea and vomiting.  All other systems reviewed and are negative.   Physical Exam Updated Vital Signs BP (!) 152/93   Pulse (!) 106   Temp 97.6 F (36.4 C) (Oral)   Resp 16   LMP  (LMP Unknown) Comment: Post Menopausal  SpO2 100%  Physical Exam Vitals and nursing note reviewed.  Constitutional:      Appearance: She is well-developed. She is not ill-appearing.  HENT:     Head: Normocephalic and atraumatic.  Eyes:     Pupils: Pupils are equal, round, and reactive to light.  Cardiovascular:     Rate and Rhythm: Regular rhythm. Tachycardia present.     Heart sounds: Normal heart sounds.  Pulmonary:     Effort: Pulmonary effort is normal. No respiratory distress.     Breath sounds: No wheezing.  Abdominal:     General: Bowel sounds are decreased.     Palpations: Abdomen is soft.     Tenderness: There is abdominal tenderness.     Comments: Vertical midline incision clean dry and intact, staples in place, no adjacent erythema, unable to auscultate bowel sounds in any quadrant  Musculoskeletal:     Cervical back: Neck supple.  Skin:    General: Skin is warm and dry.  Neurological:  Mental Status: She is alert and oriented to person, place, and time.  Psychiatric:        Mood and Affect: Mood normal.     ED Results / Procedures / Treatments   Labs (all labs ordered are listed, but only abnormal results are displayed) Labs Reviewed  COMPREHENSIVE METABOLIC PANEL - Abnormal; Notable for the following components:      Result Value   Calcium 8.7 (*)    Albumin 2.9 (*)    Alkaline Phosphatase 142 (*)    All other components within normal limits  URINALYSIS, ROUTINE W REFLEX MICROSCOPIC - Abnormal; Notable for the following components:   Ketones, ur 5 (*)    Protein, ur 30 (*)    All other components within normal limits  CBC WITH DIFFERENTIAL/PLATELET     EKG None  Radiology DG Abdomen 1 View  Result Date: 02/08/2023 CLINICAL DATA:  52 year old female with history of abdominal pain and vomiting. Crohn's disease. EXAM: ABDOMEN - 1 VIEW COMPARISON:  Abdominal radiograph 01/29/2023. FINDINGS: No pathologic dilatation of small bowel or colon. Gas and stool are noted in the colon and rectum. No definite pneumoperitoneum noted on this single supine image. Surgical staples are noted in the lower abdomen and pelvis near the midline. IVC filter in position with tip projecting over the superior endplate of L2. IMPRESSION: 1. Nonobstructive bowel gas pattern. 2. No pneumoperitoneum. 3. Postoperative changes and support apparatus, as above. Electronically Signed   By: Trudie Reed M.D.   On: 02/08/2023 06:38    Procedures Procedures    Medications Ordered in ED Medications  sodium chloride 0.9 % bolus 1,000 mL (1,000 mLs Intravenous New Bag/Given 02/08/23 2841)  morphine (PF) 4 MG/ML injection 4 mg (4 mg Intravenous Given 02/08/23 0616)  ondansetron (ZOFRAN) injection 4 mg (4 mg Intravenous Given 02/08/23 0615)    ED Course/ Medical Decision Making/ A&P                             Medical Decision Making Amount and/or Complexity of Data Reviewed Labs: ordered. Radiology: ordered.  Risk Prescription drug management.   This patient presents to the ED for concern of abdominal pain, moaning, this involves an extensive number of treatment options, and is a complaint that carries with it a high risk of complications and morbidity.  I considered the following differential and admission for this acute, potentially life threatening condition.  The differential diagnosis includes postop obstruction, infection MDM:    This is a 52 year old female who presents with recurrent abdominal pain and vomiting.  She is overall nontoxic-appearing but is chronically ill-appearing.  Surgical incision is clean dry and intact.  She does have diminished bowel  sounds on exam.  However, she reports that she is passing gas and has had some loose stool.  Labs obtained.  CMP is largely reassuring.  Awaiting CBC.  Plain film does not show any evidence of or suggestive obstructive process.  However given recent complicated abdominal surgical history, will obtain CT scan.  (Labs, imaging, consults)  Labs: I Ordered, and personally interpreted labs.  The pertinent results include: CBC, CMP, urinalysis  Imaging Studies ordered: I ordered imaging studies including KUB, CT abdomen I independently visualized and interpreted imaging. I agree with the radiologist interpretation  Additional history obtained from chart review.  External records from outside source obtained and reviewed including discharge summary  Cardiac Monitoring: The patient was maintained on a cardiac monitor.  If on the cardiac monitor, I personally viewed and interpreted the cardiac monitored which showed an underlying rhythm of: Sinus rhythm  Reevaluation: After the interventions noted above, I reevaluated the patient and found that they have :stayed the same  Social Determinants of Health:  lives independently  Disposition: Pending, patient signed out to oncoming provider  Co morbidities that complicate the patient evaluation No past medical history on file.   Medicines Meds ordered this encounter  Medications   sodium chloride 0.9 % bolus 1,000 mL   morphine (PF) 4 MG/ML injection 4 mg   ondansetron (ZOFRAN) injection 4 mg    I have reviewed the patients home medicines and have made adjustments as needed  Problem List / ED Course: Problem List Items Addressed This Visit   None               Final Clinical Impression(s) / ED Diagnoses Final diagnoses:  None    Rx / DC Orders ED Discharge Orders     None         Shon Baton, MD 02/08/23 [phone]

## 2023-02-08 NOTE — ED Triage Notes (Signed)
Patient is A&Ox4 c/o abdominal pain. Patient recently had abdominal surgery. Patient was discharged yesterday from the hospital. Patient has N/V and abdominal pain. Patient felt like she was going to fall so she sat on the floor. A friend helped her up.  Hx: Crohns.

## 2023-02-08 NOTE — Progress Notes (Signed)
Tara Berger 

## 2023-02-08 NOTE — ED Notes (Signed)
Pt was assisted onto bedpan and pt voided.

## 2023-02-08 NOTE — ED Provider Notes (Signed)
7:45 AM Care assumed from Dr. Wilkie Aye.  At time of transfer of care, patient is awaiting CT abdomen pelvis to rule out recurrent small bowel obstruction given nausea vomiting and abdominal pain in the setting of recent ex lap.  Patient was reportedly discharged yesterday.  After CT imaging, anticipate discussion with general surgery for evaluation and recommendations.  I called general surgery to come see patient while awaiting CT imaging.  They felt that patient will need admission to likely medicine if there is no acute surgical problem.  1:13 PM CT scan does not show acute surgical abnormality such as perforation, anastomotic leak, abscess, or obstruction.  Will admit to medicine per general surgery recommendations for the pain, nausea, vomiting, tachycardia, and intolerance to p.o.   Tara Berger, Canary Brim, MD 02/08/23 [phone]

## 2023-02-08 NOTE — Progress Notes (Signed)
Subjective: CC: Abdominal pain, n/v.   Known to our service. 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 Magnus Ivan 01/29/23  Discharged 6/26.  Re-confirmed with her that the morning of discharge she as having mild lower abdominal pain and tolerated most of her breakfast without nausea or vomiting.  She was passing flatus and had BM yesterday that was nonbloody and nonmelanotic.  She reports when she returned home yesterday, around 530pm, she began having constant, severe (9/10) pain in her epigastrium/the top of her incision with associated nausea and frequent episodes of vomiting.  Reports this pain is new and different than anything she had during admission.  Since onset of the symptoms she has been feeling feverish but has not had a documented fever at home. Also feels tired/fatigued and is having lower extremity swelling. She is unsure if she has had any flatus today.  Last BM was yesterday afternoon and loose/diarrhea. Still able to void. Denies cp, sob or urinary symptoms. No drainage from midline wound.   Returned to ED 6/27 w/ above complaints.   Afebrile. Tachy in the 100's. No hypotension. WBC 14.5 from 8.7 yesterday. Hgb 10.9 from 7.7 yesterday. No AKI. Xray with non-obstructive bowel gas pattern and no obvious free air. CT pending.   Objective: Vital signs in last 24 hours: Temp:  [97.6 F (36.4 C)-99.2 F (37.3 C)] 99.2 F (37.3 C) (06/27 0930) Pulse Rate:  [97-115] 105 (06/27 0947) Resp:  [16] 16 (06/27 0947) BP: (144-171)/(89-115) 164/97 (06/27 0947) SpO2:  [97 %-100 %] 100 % (06/27 0947)    Intake/Output from previous day: No intake/output data recorded. Intake/Output this shift: No intake/output data recorded.  PE: Gen:  Alert, NAD, pleasant Heart: Tachycardic  Lungs: CTA b/l, normal rate and effort Abd: Soft, mild distension, she is ttp in her epigastrium/the  top of her incision along with on the R side of her abdomen. No rigidity or guarding. +BS. Midline wound as noted in picture below. There is an area you can see in the picture about 1/3 down from the top of the incision where staples no longer grab both edges of the incisions skin edges. Attempted to probe with sterile cotton swab but this seemed very superficial. There was no drainage. Area where staples are no longer in place on inferior 1/3 of the wound as seen below but it appears the skin has closed here. There was no heat, erythema surrounding the wound or obvious drainage from the wound.  Msk: 1-2 + b/l LE edema. Non-tender.  Neuro: CN 3-12 grossly intact. MAE's. Thought process intact. Answers questions appropriately.     Lab Results:  Recent Labs    02/07/23 0519 02/08/23 0559  WBC 8.7 14.5*  HGB 7.7* 10.9*  HCT 24.8* 34.4*  PLT 281 457*    BMET Recent Labs    02/06/23 0335 02/08/23 0559  NA  --  135  K 3.7 3.8  CL  --  100  CO2  --  23  GLUCOSE  --  98  BUN  --  11  CREATININE  --  0.68  CALCIUM  --  8.7*    PT/INR No results for input(s): "LABPROT", "INR" in the last 72 hours. CMP     Component Value Date/Time   NA 135 02/08/2023 0559   K 3.8 02/08/2023 0559   CL 100 02/08/2023 0559   CO2 23 02/08/2023  0559   GLUCOSE 98 02/08/2023 0559   BUN 11 02/08/2023 0559   CREATININE 0.68 02/08/2023 0559   CALCIUM 8.7 (L) 02/08/2023 0559   PROT 7.5 02/08/2023 0559   ALBUMIN 2.9 (L) 02/08/2023 0559   AST 28 02/08/2023 0559   ALT 38 02/08/2023 0559   ALKPHOS 142 (H) 02/08/2023 0559   BILITOT 0.9 02/08/2023 0559   GFRNONAA >60 02/08/2023 0559   GFRAA  06/15/2010 0447    >60        The eGFR has been calculated using the MDRD equation. This calculation has not been validated in all clinical situations. eGFR's persistently <60 mL/min signify possible Chronic Kidney Disease.   Lipase     Component Value Date/Time   LIPASE 22 01/24/2023 1240     Studies/Results: DG Abdomen 1 View  Result Date: 02/08/2023 CLINICAL DATA:  52 year old female with history of abdominal pain and vomiting. Crohn's disease. EXAM: ABDOMEN - 1 VIEW COMPARISON:  Abdominal radiograph 01/29/2023. FINDINGS: No pathologic dilatation of small bowel or colon. Gas and stool are noted in the colon and rectum. No definite pneumoperitoneum noted on this single supine image. Surgical staples are noted in the lower abdomen and pelvis near the midline. IVC filter in position with tip projecting over the superior endplate of L2. IMPRESSION: 1. Nonobstructive bowel gas pattern. 2. No pneumoperitoneum. 3. Postoperative changes and support apparatus, as above. Electronically Signed   By: Trudie Reed M.D.   On: 02/08/2023 06:38    Anti-infectives: Anti-infectives (From admission, onward)    None        Path  FINAL MICROSCOPIC DIAGNOSIS:   A. SMALL BOWEL, ANASTOMOTIC STRICTURE, RESECTION:  Fibromuscular hypertrophy with adhesions and proximal dilatation  consistent with anastomotic stricture.  Negative for malignancy.    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 Magnus Ivan 01/29/23 - Path benign as above.  - CT 6/20 reassuring with no evidence of abscess or leak.  - Discharged 6/26. Back 6/27 with upper abdominal pain, n/v.  - No peritonitis on exam. Agree with CT A/P. Will f/u on results.  - Further recs to follow.    FEN - NPO for CT VTE - SCDs, on Eliquis at baseline. None currently.  ID - No current abx.  Foley - None currently. Reports she is voiding.  Plan - F/u on CT scan. If this does not show anything that needs acute surgical intervention, would recommend admission to [facility] and we will follow.    ABL anemia - Required transfusions during prior admission. Hgb stable at 10.9 today.  Hx Crohn's - Per GI. 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 Some LE edema today. Consider LE duplex vs w/u for other etiologies of her LE edema    LOS: 0 days    [name] , [facility] Surgery 02/08/2023, 10:17 AM Please see Amion for pager number during day hours 7:00am-4:30pm

## 2023-02-09 ENCOUNTER — Observation Stay (HOSPITAL_COMMUNITY): Payer: Medicaid Other

## 2023-02-09 DIAGNOSIS — I10 Essential (primary) hypertension: Secondary | ICD-10-CM | POA: Diagnosis present

## 2023-02-09 DIAGNOSIS — Z91148 Patient's other noncompliance with medication regimen for other reason: Secondary | ICD-10-CM | POA: Diagnosis not present

## 2023-02-09 DIAGNOSIS — F17210 Nicotine dependence, cigarettes, uncomplicated: Secondary | ICD-10-CM | POA: Diagnosis present

## 2023-02-09 DIAGNOSIS — D696 Thrombocytopenia, unspecified: Secondary | ICD-10-CM | POA: Diagnosis present

## 2023-02-09 DIAGNOSIS — R109 Unspecified abdominal pain: Secondary | ICD-10-CM | POA: Diagnosis present

## 2023-02-09 DIAGNOSIS — K5000 Crohn's disease of small intestine without complications: Secondary | ICD-10-CM | POA: Diagnosis present

## 2023-02-09 DIAGNOSIS — J90 Pleural effusion, not elsewhere classified: Secondary | ICD-10-CM | POA: Diagnosis present

## 2023-02-09 DIAGNOSIS — B370 Candidal stomatitis: Secondary | ICD-10-CM | POA: Diagnosis present

## 2023-02-09 DIAGNOSIS — E876 Hypokalemia: Secondary | ICD-10-CM | POA: Diagnosis not present

## 2023-02-09 DIAGNOSIS — Z79899 Other long term (current) drug therapy: Secondary | ICD-10-CM | POA: Diagnosis not present

## 2023-02-09 DIAGNOSIS — F411 Generalized anxiety disorder: Secondary | ICD-10-CM | POA: Diagnosis present

## 2023-02-09 DIAGNOSIS — R0902 Hypoxemia: Secondary | ICD-10-CM | POA: Diagnosis not present

## 2023-02-09 DIAGNOSIS — Z86718 Personal history of other venous thrombosis and embolism: Secondary | ICD-10-CM | POA: Diagnosis not present

## 2023-02-09 DIAGNOSIS — G9341 Metabolic encephalopathy: Secondary | ICD-10-CM | POA: Diagnosis not present

## 2023-02-09 DIAGNOSIS — I69354 Hemiplegia and hemiparesis following cerebral infarction affecting left non-dominant side: Secondary | ICD-10-CM | POA: Diagnosis not present

## 2023-02-09 DIAGNOSIS — Z91040 Latex allergy status: Secondary | ICD-10-CM | POA: Diagnosis not present

## 2023-02-09 DIAGNOSIS — Z86711 Personal history of pulmonary embolism: Secondary | ICD-10-CM | POA: Diagnosis not present

## 2023-02-09 DIAGNOSIS — Z7901 Long term (current) use of anticoagulants: Secondary | ICD-10-CM | POA: Diagnosis not present

## 2023-02-09 DIAGNOSIS — E871 Hypo-osmolality and hyponatremia: Secondary | ICD-10-CM | POA: Diagnosis not present

## 2023-02-09 DIAGNOSIS — J189 Pneumonia, unspecified organism: Secondary | ICD-10-CM | POA: Diagnosis not present

## 2023-02-09 DIAGNOSIS — D75839 Thrombocytosis, unspecified: Secondary | ICD-10-CM | POA: Diagnosis present

## 2023-02-09 DIAGNOSIS — E440 Moderate protein-calorie malnutrition: Secondary | ICD-10-CM | POA: Diagnosis present

## 2023-02-09 DIAGNOSIS — R1084 Generalized abdominal pain: Secondary | ICD-10-CM | POA: Diagnosis present

## 2023-02-09 DIAGNOSIS — I050 Rheumatic mitral stenosis: Secondary | ICD-10-CM | POA: Diagnosis present

## 2023-02-09 LAB — CBC
HCT: 29.2 % — ABNORMAL LOW (ref 36.0–46.0)
Hemoglobin: 9.1 g/dL — ABNORMAL LOW (ref 12.0–15.0)
MCH: 27.2 pg (ref 26.0–34.0)
MCHC: 31.2 g/dL (ref 30.0–36.0)
MCV: 87.2 fL (ref 80.0–100.0)
Platelets: 467 10*3/uL — ABNORMAL HIGH (ref 150–400)
RBC: 3.35 MIL/uL — ABNORMAL LOW (ref 3.87–5.11)
RDW: 18.6 % — ABNORMAL HIGH (ref 11.5–15.5)
WBC: 14.2 10*3/uL — ABNORMAL HIGH (ref 4.0–10.5)
nRBC: 0.2 % (ref 0.0–0.2)

## 2023-02-09 LAB — COMPREHENSIVE METABOLIC PANEL
ALT: 33 U/L (ref 0–44)
AST: 24 U/L (ref 15–41)
Albumin: 2.4 g/dL — ABNORMAL LOW (ref 3.5–5.0)
Alkaline Phosphatase: 103 U/L (ref 38–126)
Anion gap: 9 (ref 5–15)
BUN: 7 mg/dL (ref 6–20)
CO2: 20 mmol/L — ABNORMAL LOW (ref 22–32)
Calcium: 7.8 mg/dL — ABNORMAL LOW (ref 8.9–10.3)
Chloride: 102 mmol/L (ref 98–111)
Creatinine, Ser: 0.61 mg/dL (ref 0.44–1.00)
GFR, Estimated: >60 mL/min (ref >60)
Glucose, Bld: 105 mg/dL — ABNORMAL HIGH (ref 70–99)
Potassium: 4.5 mmol/L (ref 3.5–5.1)
Sodium: 131 mmol/L — ABNORMAL LOW (ref 135–145)
Total Bilirubin: 1.1 mg/dL (ref 0.3–1.2)
Total Protein: 6.2 g/dL — ABNORMAL LOW (ref 6.5–8.1)

## 2023-02-09 LAB — MAGNESIUM: Magnesium: 1.5 mg/dL — ABNORMAL LOW (ref 1.7–2.4)

## 2023-02-09 LAB — GLUCOSE, CAPILLARY
Glucose-Capillary: 93 mg/dL (ref 70–99)
Glucose-Capillary: 95 mg/dL (ref 70–99)
Glucose-Capillary: 111 mg/dL — ABNORMAL HIGH (ref 70–99)

## 2023-02-09 MED ORDER — POTASSIUM CHLORIDE 2 MEQ/ML IV SOLN
INTRAVENOUS | Status: DC
  Filled 2023-02-09 (×3): qty 1000

## 2023-02-09 MED ORDER — ACETAMINOPHEN 10 MG/ML IV SOLN
1000.0000 mg | Freq: Four times a day (QID) | INTRAVENOUS | Status: AC
  Administered 2023-02-09 – 2023-02-10 (×4): 1000 mg via INTRAVENOUS
  Filled 2023-02-09 (×4): qty 100

## 2023-02-09 MED ORDER — MAGNESIUM SULFATE 2 GM/50ML IV SOLN
2.0000 g | Freq: Once | INTRAVENOUS | Status: AC
  Administered 2023-02-09: 2 g via INTRAVENOUS
  Filled 2023-02-09: qty 50

## 2023-02-09 MED ORDER — HYDROMORPHONE HCL 1 MG/ML IJ SOLN
0.5000 mg | INTRAMUSCULAR | Status: DC | PRN
  Administered 2023-02-10 – 2023-02-15 (×16): 0.5 mg via INTRAVENOUS
  Filled 2023-02-09 (×17): qty 0.5

## 2023-02-09 MED ORDER — AMLODIPINE BESYLATE 5 MG PO TABS
5.0000 mg | ORAL_TABLET | Freq: Every day | ORAL | Status: DC
  Administered 2023-02-09 – 2023-02-15 (×7): 5 mg via ORAL
  Filled 2023-02-09 (×6): qty 1

## 2023-02-09 MED ORDER — ENOXAPARIN SODIUM 40 MG/0.4ML IJ SOSY
40.0000 mg | PREFILLED_SYRINGE | INTRAMUSCULAR | Status: DC
  Administered 2023-02-09 – 2023-02-12 (×4): 40 mg via SUBCUTANEOUS
  Filled 2023-02-09 (×4): qty 0.4

## 2023-02-09 MED ORDER — NYSTATIN 100000 UNIT/ML MT SUSP
5.0000 mL | Freq: Four times a day (QID) | OROMUCOSAL | Status: DC
  Administered 2023-02-09 – 2023-02-15 (×24): 500000 [IU] via ORAL
  Filled 2023-02-09 (×24): qty 5

## 2023-02-09 MED ORDER — BOOST / RESOURCE BREEZE PO LIQD CUSTOM
1.0000 | Freq: Three times a day (TID) | ORAL | Status: DC
  Administered 2023-02-10 (×3): 1 via ORAL

## 2023-02-09 MED ORDER — HYDRALAZINE HCL 20 MG/ML IJ SOLN: 5.0000 mg | Freq: Four times a day (QID) | INTRAMUSCULAR | Status: DC | PRN

## 2023-02-09 MED ORDER — FLUCONAZOLE IN SODIUM CHLORIDE 200-0.9 MG/100ML-% IV SOLN
200.0000 mg | INTRAVENOUS | Status: DC
  Administered 2023-02-09 – 2023-02-15 (×7): 200 mg via INTRAVENOUS
  Filled 2023-02-09 (×7): qty 100

## 2023-02-09 MED ORDER — SODIUM CHLORIDE 0.9 % IV SOLN
2.0000 g | INTRAVENOUS | Status: DC
  Administered 2023-02-09 – 2023-02-11 (×3): 2 g via INTRAVENOUS
  Filled 2023-02-09 (×3): qty 20

## 2023-02-09 NOTE — Progress Notes (Addendum)
Subjective: CC: Abdominal pain near the top of her incision has improved and is well controlled. Nausea resolved this am. Tolerated some cld yesterday. BM yesterday and today that were soft and non-bloody. Voiding without issues. No cp or sob.   Afebrile (Tmax 100). Tachycardia resolved. No hypotension. WBC 14.2 from 14.5. Hgb 9.1 from 10.9.   Objective: Vital signs in last 24 hours: Temp:  [98.2 F (36.8 C)-100 F (37.8 C)] 98.2 F (36.8 C) (06/28 0944) Pulse Rate:  [78-111] 83 (06/28 0944) Resp:  [16-18] 18 (06/28 0944) BP: (148-186)/(91-120) 186/113 (06/28 0944) SpO2:  [91 %-100 %] 100 % (06/28 0944) Last BM Date : 02/07/23  Intake/Output from previous day: 06/27 0701 - 06/28 0700 In: 3445 [P.O.:60; I.V.:1378.3; IV Piggyback:2006.7] Out: -  Intake/Output this shift: Total I/O In: -  Out: 425 [Urine:425]  PE: Gen:  Alert, NAD, pleasant Abd: Soft, no distension, mildly tender around her incision - mainly around the superior aspect and her epigastrium. No rigidity or guarding and otherwise NT, +BS. Incision cdi and stable from picture/description on 6/27  Lab Results:  Recent Labs    02/08/23 0559 02/09/23 0445  WBC 14.5* 14.2*  HGB 10.9* 9.1*  HCT 34.4* 29.2*  PLT 457* 467*    BMET Recent Labs    02/08/23 0559 02/09/23 0445  NA 135 131*  K 3.8 4.5  CL 100 102  CO2 23 20*  GLUCOSE 98 105*  BUN 11 7  CREATININE 0.68 0.61  CALCIUM 8.7* 7.8*    PT/INR No results for input(s): "LABPROT", "INR" in the last 72 hours. CMP     Component Value Date/Time   NA 131 (L) 02/09/2023 0445   K 4.5 02/09/2023 0445   CL 102 02/09/2023 0445   CO2 20 (L) 02/09/2023 0445   GLUCOSE 105 (H) 02/09/2023 0445   BUN 7 02/09/2023 0445   CREATININE 0.61 02/09/2023 0445   CALCIUM 7.8 (L) 02/09/2023 0445   PROT 6.2 (L) 02/09/2023 0445   ALBUMIN 2.4 (L) 02/09/2023 0445   AST 24 02/09/2023 0445   ALT 33 02/09/2023 0445   ALKPHOS 103 02/09/2023 0445   BILITOT 1.1  02/09/2023 0445   GFRNONAA >60 02/09/2023 0445   GFRAA  06/15/2010 0447    >60        The eGFR has been calculated using the MDRD equation. This calculation has not been validated in all clinical situations. eGFR's persistently <60 mL/min signify possible Chronic Kidney Disease.   Lipase     Component Value Date/Time   LIPASE 22 01/24/2023 1240    Studies/Results: DG Chest 2 View  Result Date: 02/09/2023 CLINICAL DATA:  Pleural effusion EXAM: CHEST - 2 VIEW COMPARISON:  CT 02/01/2023 and x-ray FINDINGS: Small pleural effusions, left-greater-than-right. Additional deformity along the left midthorax with presumed lateral pleural thickening. Volume loss of the left hemithorax. No edema or pneumothorax. Stable cardiopericardial silhouette. Air-fluid level along the stomach. IVC filter seen at the edge of the imaging field on the lateral view. Previous PICC no longer seen IMPRESSION: Previous PICC no longer seen. Otherwise no significant interval change when adjusting for technique Electronically Signed   By: Karen Kays M.D.   On: 02/09/2023 10:44   CT ABDOMEN PELVIS W CONTRAST  Result Date: 02/08/2023 CLINICAL DATA:  History of Crohn's disease with recent exploratory laparotomy and resection ileocolic anastomosis and reanastomosis. Patient is having abdominal pain. EXAM: CT ABDOMEN AND PELVIS WITH CONTRAST TECHNIQUE: Multidetector CT imaging of the  abdomen and pelvis was performed using the standard protocol following bolus administration of intravenous contrast. RADIATION DOSE REDUCTION: This exam was performed according to the departmental dose-optimization program which includes automated exposure control, adjustment of the mA and/or kV according to patient size and/or use of iterative reconstruction technique. CONTRAST:  OMNIPAQUE IOHEXOL 300 MG/ML  SOLN COMPARISON:  CT scan 02/01/2023 FINDINGS: Lower chest: Persistent bilateral pleural effusions and overlying atelectasis. The heart  is normal in size. No pericardial effusion. Hepatobiliary: No hepatic lesions or intrahepatic biliary dilatation. The gallbladder is unremarkable. No common bile duct dilatation. Pancreas: No mass, inflammation or ductal dilatation. Spleen: Normal size.  No focal lesions. Adrenals/Urinary Tract: The adrenal glands and kidneys are unremarkable. The bladder is normal. Stomach/Bowel: The stomach, duodenum and small bowel are unremarkable. No obstructive findings. Stable surgical changes from recent ileal colonic anastomosis. No free air. Scattered free fluid in the abdomen and pelvis. Vascular/Lymphatic: The aorta and branch vessels are normal. IVC filter is stable. No abdominal or pelvic lymphadenopathy. Reproductive: The uterus and ovaries are unremarkable. Other: Small amount of free pelvic fluid. Musculoskeletal: No significant findings. IMPRESSION: 1. Stable surgical changes from recent ileal colonic anastomosis. No obstructive findings. 2. Scattered free fluid in the abdomen and pelvis. No findings for intra-abdominal abscess. 3. Persistent bilateral pleural effusions and overlying atelectasis. Electronically Signed   By: Rudie Meyer M.D.   On: 02/08/2023 12:56   DG Abdomen 1 View  Result Date: 02/08/2023 CLINICAL DATA:  52 year old female with history of abdominal pain and vomiting. Crohn's disease. EXAM: ABDOMEN - 1 VIEW COMPARISON:  Abdominal radiograph 01/29/2023. FINDINGS: No pathologic dilatation of small bowel or colon. Gas and stool are noted in the colon and rectum. No definite pneumoperitoneum noted on this single supine image. Surgical staples are noted in the lower abdomen and pelvis near the midline. IVC filter in position with tip projecting over the superior endplate of L2. IMPRESSION: 1. Nonobstructive bowel gas pattern. 2. No pneumoperitoneum. 3. Postoperative changes and support apparatus, as above. Electronically Signed   By: Trudie Reed M.D.   On: 02/08/2023 06:38     Anti-infectives: Anti-infectives (From admission, onward)    Start     Dose/Rate Route Frequency Ordered Stop   02/09/23 1200  fluconazole (DIFLUCAN) IVPB 200 mg        200 mg 100 mL/hr over 60 Minutes Intravenous Every 24 hours 02/09/23 1112     02/09/23 1200  cefTRIAXone (ROCEPHIN) 2 g in sodium chloride 0.9 % 100 mL IVPB        2 g 200 mL/hr over 30 Minutes Intravenous Every 24 hours 02/09/23 1114          Path  FINAL MICROSCOPIC DIAGNOSIS:   A. SMALL BOWEL, ANASTOMOTIC STRICTURE, RESECTION:  Fibromuscular hypertrophy with adhesions and proximal dilatation  consistent with anastomotic stricture.  Negative for malignancy.    Assessment/Plan POD 11 s/p ex lap, loa, resection of ileocolic anastomosis with new ileocolic anastomosis for sbo 2/2 strictured ileocolic anastomosis by Dr. Magnus Ivan 01/29/23 - Path benign as above.  - CT 6/20 with no evidence of abscess or leak.  - CT 6/28 reassuring with no evidence of obstruction or abscess - Plan staple removal on POD 14 if she is still here. Otherwise she has f/u appointments arranged.  - Okay to advance diet as tolerated if nausea stays resolved - Discussed case with attending. Will plan to see again on Monday, 7/1, please call with any questions or concerns over the weekend.  FEN - CLD. Okay to advance diet as tolerated if nausea stays resolved.  VTE - SCDs, okay to resume anticoagulation ID - Rocephin/Fluconazole per [facility] Foley - None currently. Reports she is voiding.    - Per [facility] -  Bilateral pleural effusions ABL anemia - Required transfusions during prior admission. Hgb overall stable now Hx Crohn's - Per GI. 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 On Eliquis at baseline.    LOS: 0 days    [name] , [facility] Surgery 02/09/2023, 11:43  AM Please see Amion for pager number during day hours 7:00am-4:30pm

## 2023-02-09 NOTE — TOC CM/SW Note (Signed)
Transition of Care [facility]) - Inpatient Brief Assessment  Patient Details  Name: Tara Berger MRN: [id number] Date of Birth: [birth year]  Transition of Care [facility]) CM/SW Contact:    Ewing Schlein, LCSW Phone Number: 02/09/2023, 3:28 PM  Clinical Narrative: Patient recently discharged from the hospital (02/07/23) and will need new HHPT orders for [facility] as the agency was unable to open patient to [facility] services as patient returned to the hospital before being seen.  Transition of Care Asessment: Insurance and Status: Insurance coverage has been reviewed Patient has primary care physician: Yes (Patient is assigned a PCP by Medicaid.) Home environment has been reviewed: From home alone Prior level of function:: Independent at baseline Prior/Current Home Services: No current home services (Patient was referred to [facility] for HHPT on 02/07/23, but returned to the hospital prior to being opened up for services.) Social Determinants of Health Reivew: SDOH reviewed no interventions necessary Readmission risk has been reviewed: Yes Transition of care needs: no transition of care needs at this time

## 2023-02-09 NOTE — Progress Notes (Signed)
Initial Nutrition Assessment  DOCUMENTATION CODES:   Non-severe (moderate) malnutrition in context of chronic illness  INTERVENTION:   -Boost Breeze po TID, each supplement provides 250 kcal and 9 grams of protein   -Once diet advanced: Tara Berger 1.4 PO BID, each provides 455 kcals and 20g protein  -If diet unable to be tolerated and advanced, recommend resuming TPN.  NUTRITION DIAGNOSIS:   Moderate Malnutrition related to chronic illness, altered GI function (Crohn's disease with multiple surgeries in the past) as evidenced by mild fat depletion, moderate muscle depletion.  GOAL:   Patient will meet greater than or equal to 90% of their needs  MONITOR:   Supplement acceptance, Weight trends, I & O's, PO intake, Labs, Diet advancement  REASON FOR ASSESSMENT:   Diagnosis, history of needing nutrition support    ASSESSMENT:   52 y.o. 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  She returned to the emergency department complaints of abdominal distention, abdominal pain, nausea and multiple episodes of emesis in the setting of SBO.  The patient was ultimately taken to the OR undergoing exploratory laparotomy, LOA, resection of ileocolic anastomosis with new ileocolic anastomosis for sbo 2/2 strictured ileocolic anastomosis by Dr. Magnus Ivan 01/29/23. Discharged 02/07/23, but returned to ED later d/t abdominal pain and nausea/vomiting.  Patient in room, not  feeling well. Reports she has been having N/V today. These symptoms returned following discharge on 6/26. Pt states earlier that day she was tolerating food but wasn't eating much. Never received a protein supplement. Does not tolerate Ensure supplements. Surgery has started [employer] and clear liquid diet. If diet advanced, trial [name] -plant based protein shake. Pt required TPN last admission. Will continue to monitor PO tolerance and diet advancement.   Per weight records, pt's weight has been trending up, likely r/t TPN from last admission.  Current weight: 140 lbs  Medications: D5 infusion, IV Mg sulfate, Zofran  Labs reviewed:  CBGs: 93 Low Na Low Mg  NUTRITION - FOCUSED PHYSICAL EXAM:  Flowsheet Row Most Recent Value  Orbital Region Mild depletion  Upper Arm Region Mild depletion  Thoracic and Lumbar Region Mild depletion  Buccal Region Moderate depletion  Temple Region Moderate depletion  Clavicle Bone Region Moderate depletion  Clavicle and Acromion Bone Region Moderate depletion  Scapular Bone Region Moderate depletion  Dorsal Hand Mild depletion  Patellar Region Mild depletion  Anterior Thigh Region Mild depletion  Posterior Calf Region Mild depletion  Edema (RD Assessment) None  Hair Reviewed  Eyes Reviewed  Mouth Reviewed  Skin Reviewed  Nails Reviewed       Diet Order:   Diet Order             Diet clear liquid Room service appropriate? Yes; Fluid consistency: Thin  Diet effective now                   EDUCATION NEEDS:   No education needs have been identified at this time  Skin:  Skin Integrity Issues:: Incisions Incisions: 6/27 abdomen  Last BM:  6/28 -type 6  Height:   Ht Readings from Last 1 Encounters:  02/01/23 5\' 2"  (1.575 m)    Weight:   Wt Readings from Last 1 Encounters:  02/07/23 63.9 kg    BMI:  25.6 kg/m^2  Estimated Nutritional Needs:   Kcal:  1800-2000  Protein:  90-105g  Fluid:  2L/day   Tara Franco, MS, RD, LDN Inpatient Clinical Dietitian Contact information available via Amion

## 2023-02-09 NOTE — Progress Notes (Signed)
PROGRESS NOTE    Tara Berger  ZOX:[id number] DOB: 1971/05/26 DOA: 02/08/2023 PCP: Pcp, No   Brief Narrative: 51 with past medical history significant for Crohn's disease, vulvar abscess, cystitis, bacterial pneumonia, history of cardioembolic CVA 2023, with resultant left-sided hemiparesis, hypertension, generalized anxiety disorder, iron deficiency anemia, thrombocytopenia, rheumatic mitral stenosis, tobacco use disorder, DVT and PE in February this year, no compliance with apixaban, recently admission for SBO requiring NG tube, TPN, she left AMA during that hospitalization subsequently returned and was taken to the OR for exploratory laparotomy, LOA , resection of ileocolic anastomosis with new ileocolic anastomosis for SBO secondary to stricture by Dr. Magnus Ivan.  She was discharged the day prior to this admission and she returns with worsening abdominal pain nausea and vomiting.  CT abdomen and pelvis with a stable surgical changes from recent ileocolonic anastomosis, no obstructive finding.  Scattered free fluid in the abdomen and pelvis.  No finding or intra-abdominal abscess.  Persistent bilateral pleural effusion.   Assessment & Plan:   Principal Problem:   Intractable abdominal pain Active Problems:   Essential hypertension   History of pulmonary embolism   Crohn's disease (HCC)   Malnutrition of moderate degree   Left hemiparesis (HCC)   Tobacco use disorder   Nicotine dependence, cigarettes, uncomplicated   Normocytic anemia   Thrombocytosis  1-Intractable abdominal pain, recent SBO,LOA SP resection of ileocolic anastomosis with new ileocolic anastomosis on 6/17 by Dr Magnus Ivan.  CT abdomen pelvis 6/27; negative for abscess or obstruction.  Report diarrhea. Check for C diff.  Surgery consulted. Recommend to advanced diet as tolerated.  PRN Zofran, IV dilaudid. Careful with oversedation.    2-Crohn's disease: If abdominal pain doesn't improved will consider GI  consultation.  Diarrhea; check for C diff.   3-Essential hypertension:Uncontrolled HTN Started Norvasc.  PRN Hydralazine.   Oral thrush;  Start Nystatin and IV diflucan.   Pleural effusion: chest x ray showed BL small pleural effusion. Volume loss left Hemithorax.  Careful  with sedatives.  Incentive spirometry.  Pain medication dose reduce.    Acute Blood Loss anemia during last admission.   Normocytic anemia: Required multiples Blood transfusion last admission.  Hb remain stable. Ok to resume anticoagulation per surgery. Will start with Lovenox prophylaxis dose./    Chronic Left hemiparesis: History of CVA PT consult.   Pulmonary embolism on the right and history of DVT: plan to resume Lovenox prophylaxis dose, ok to resume anticoagulation per sx.  If hb remain stable will subsequently transition to eliquis.   Tobacco use disorder: Counseling   Malnutrition of moderate degree: Started on Supplement.    Leukocytosis; cover for PNA, report cough, chest x ray with volume loss left hemothorax, pleural effusion.  Started ceftriaxone.      Estimated body mass index is 25.77 kg/m as calculated from the following:   Height as of 02/01/23: 5\' 2"  (1.575 m).   Weight as of 02/07/23: 63.9 kg.   DVT prophylaxis: Lovenox Code Status: Full code Family Communication: Disposition Plan:  Status is: Observation The patient will require care spanning > 2 midnights and should be moved to inpatient because: management of abdominal pain and poor oral intake.     Consultants:  Surgery   Procedures:  none  Antimicrobials:    Subjective: She is sleepy, report abdominal pain. She hasn't try to eat anything due to nausea.  She has oral thrush.    Objective: Vitals:   02/08/23 1509 02/08/23 1604 02/08/23 2049 02/09/23 0554  BP: Marland Kitchen)  150/91 (!) 159/95 (!) 176/114 (!) 172/120  Pulse: (!) 105 78 78 100  Resp: 18 16 18 18   Temp: 100 F (37.8 C) 99.4 F (37.4 C) 99.3 F  (37.4 C) 99.1 F (37.3 C)  TempSrc: Oral Oral Oral Oral  SpO2: 100% 91% 100%     Intake/Output Summary (Last 24 hours) at 02/09/2023 0817 Last data filed at 02/09/2023 1610 Gross per 24 hour  Intake 2444.98 ml  Output --  Net 2444.98 ml   There were no vitals filed for this visit.  Examination:  General exam: Appears calm and comfortable , oral thrush  Respiratory system: Clear to auscultation. Respiratory effort normal. Cardiovascular system: S1 & S2 heard, RRR. No JVD, murmurs, rubs, gallops or clicks. No pedal edema. Gastrointestinal system: Abdomen is nondistended, soft and nontender. No organomegaly or masses felt. Normal bowel sounds heard. Central nervous system: Alert  Extremities: Symmetric 5 x 5 power.   Data Reviewed: I have personally reviewed following labs and imaging studies  CBC: Recent Labs  Lab 02/05/23 0308 02/06/23 0335 02/07/23 0519 02/08/23 0559 02/09/23 0445  WBC 9.1 9.4 8.7 14.5* 14.2*  NEUTROABS 5.3 6.2 5.7 12.5*  --   HGB 8.4* 8.2* 7.7* 10.9* 9.1*  HCT 26.8* 25.9* 24.8* 34.4* 29.2*  MCV 86.5 86.3 87.0 88.2 87.2  PLT 223 267 281 457* 467*   Basic Metabolic Panel: Recent Labs  Lab 02/03/23 0553 02/04/23 0254 02/05/23 0308 02/06/23 0335 02/08/23 0559 02/09/23 0445  NA 134* 137 140  --  135 131*  K 4.8 3.7 3.6 3.7 3.8 4.5  CL 104 104 106  --  100 102  CO2 24 26 27   --  23 20*  GLUCOSE 165* 109* 110*  --  98 105*  BUN 26* 25* 26*  --  11 7  CREATININE 0.52 0.50 0.61  --  0.68 0.61  CALCIUM 7.8* 7.8* 7.8*  --  8.7* 7.8*  MG 2.0 1.5* 1.6* 1.7  --  1.5*  PHOS 3.8 3.8 4.4 3.4  --   --    GFR: Estimated Creatinine Clearance: 73 mL/min (by C-G formula based on SCr of 0.61 mg/dL). Liver Function Tests: Recent Labs  Lab 02/05/23 0308 02/08/23 0559 02/09/23 0445  AST 26 28 24   ALT 35 38 33  ALKPHOS 169* 142* 103  BILITOT 0.5 0.9 1.1  PROT 4.8* 7.5 6.2*  ALBUMIN 1.9* 2.9* 2.4*   No results for input(s): "LIPASE", "AMYLASE" in the  last 168 hours. No results for input(s): "AMMONIA" in the last 168 hours. Coagulation Profile: No results for input(s): "INR", "PROTIME" in the last 168 hours. Cardiac Enzymes: No results for input(s): "CKTOTAL", "CKMB", "CKMBINDEX", "TROPONINI" in the last 168 hours. BNP (last 3 results) No results for input(s): "PROBNP" in the last 8760 hours. HbA1C: No results for input(s): "HGBA1C" in the last 72 hours. CBG: Recent Labs  Lab 02/05/23 1801 02/05/23 2355 02/06/23 0614 02/06/23 0727 02/06/23 1141  GLUCAP 100* 124* 122* 130* 122*   Lipid Profile: No results for input(s): "CHOL", "HDL", "LDLCALC", "TRIG", "CHOLHDL", "LDLDIRECT" in the last 72 hours. Thyroid Function Tests: No results for input(s): "TSH", "T4TOTAL", "FREET4", "T3FREE", "THYROIDAB" in the last 72 hours. Anemia Panel: Recent Labs    02/07/23 0840  FERRITIN 242  TIBC 203*  IRON 40   Sepsis Labs: Recent Labs  Lab 02/05/23 0308  PROCALCITON 2.17    Recent Results (from the past 240 hour(s))  Urine Culture     Status: Abnormal  Collection Time: 01/31/23  2:22 PM   Specimen: Urine, Clean Catch  Result Value Ref Range Status   Specimen Description   Final    URINE, CLEAN CATCH Performed at [facility], [address], [place], [place] [address]    Special Requests   Final    NONE Performed at [facility], [address] [place], [place] [address]    Culture MULTIPLE SPECIES PRESENT, SUGGEST RECOLLECTION (A)  Final   Report Status 02/01/2023 FINAL  Final  Culture, blood (Routine X 2) w Reflex to ID Panel     Status: None   Collection Time: 02/01/23  9:51 AM   Specimen: BLOOD RIGHT HAND  Result Value Ref Range Status   Specimen Description   Final    BLOOD RIGHT HAND Performed at [facility], [address] [place], [place] [address]    Special Requests   Final    BOTTLES DRAWN AEROBIC AND ANAEROBIC Blood Culture adequate  volume Performed at [facility], [address] [place], [place] [address]    Culture   Final    NO GROWTH 5 DAYS Performed at [facility], [address] [place], [place] [address]    Report Status 02/06/2023 FINAL  Final  Culture, blood (Routine X 2) w Reflex to ID Panel     Status: None   Collection Time: 02/01/23  9:52 AM   Specimen: Right Antecubital; Blood  Result Value Ref Range Status   Specimen Description   Final    RIGHT ANTECUBITAL BLOOD Performed at [facility], [address], [place], [place] [address]    Special Requests   Final    BOTTLES DRAWN AEROBIC AND ANAEROBIC Blood Culture adequate volume Performed at [facility], [address] [place], [place] [address]    Culture   Final    NO GROWTH 5 DAYS Performed at [facility], [address], [place], [place] [address]    Report Status 02/06/2023 FINAL  Final  MRSA Next Gen by PCR, Nasal     Status: None   Collection Time: 02/01/23  9:52 AM   Specimen: Nasal Mucosa; Nasal Swab  Result Value Ref Range Status   MRSA by PCR Next Gen NOT DETECTED NOT DETECTED Final    Comment: (NOTE) The GeneXpert MRSA Assay (FDA approved for NASAL specimens only), is one component of a comprehensive MRSA colonization surveillance program. It is not intended to diagnose MRSA infection nor to guide or monitor treatment for MRSA infections. Test performance is not FDA approved in patients less than 1 years old. Performed at [facility], [address] [place], [place] [address]          Radiology Studies: CT ABDOMEN PELVIS W CONTRAST  Result Date: 02/08/2023 CLINICAL DATA:  History of Crohn's disease with recent exploratory laparotomy and resection ileocolic anastomosis and reanastomosis. Patient is having abdominal pain. EXAM: CT ABDOMEN AND PELVIS WITH CONTRAST TECHNIQUE: Multidetector CT imaging of the abdomen and pelvis was performed  using the standard protocol following bolus administration of intravenous contrast. RADIATION DOSE REDUCTION: This exam was performed according to the departmental dose-optimization program which includes automated exposure control, adjustment of the mA and/or kV according to patient size and/or use of iterative reconstruction technique. CONTRAST:  OMNIPAQUE IOHEXOL 300 MG/ML  SOLN COMPARISON:  CT scan 02/01/2023 FINDINGS: Lower chest: Persistent bilateral pleural effusions and overlying atelectasis. The heart is  normal in size. No pericardial effusion. Hepatobiliary: No hepatic lesions or intrahepatic biliary dilatation. The gallbladder is unremarkable. No common bile duct dilatation. Pancreas: No mass, inflammation or ductal dilatation. Spleen: Normal size.  No focal lesions. Adrenals/Urinary Tract: The adrenal glands and kidneys are unremarkable. The bladder is normal. Stomach/Bowel: The stomach, duodenum and small bowel are unremarkable. No obstructive findings. Stable surgical changes from recent ileal colonic anastomosis. No free air. Scattered free fluid in the abdomen and pelvis. Vascular/Lymphatic: The aorta and branch vessels are normal. IVC filter is stable. No abdominal or pelvic lymphadenopathy. Reproductive: The uterus and ovaries are unremarkable. Other: Small amount of free pelvic fluid. Musculoskeletal: No significant findings. IMPRESSION: 1. Stable surgical changes from recent ileal colonic anastomosis. No obstructive findings. 2. Scattered free fluid in the abdomen and pelvis. No findings for intra-abdominal abscess. 3. Persistent bilateral pleural effusions and overlying atelectasis. Electronically Signed   By: Rudie Meyer M.D.   On: 02/08/2023 12:56   DG Abdomen 1 View  Result Date: 02/08/2023 CLINICAL DATA:  52 year old female with history of abdominal pain and vomiting. Crohn's disease. EXAM: ABDOMEN - 1 VIEW COMPARISON:  Abdominal radiograph 01/29/2023. FINDINGS: No pathologic  dilatation of small bowel or colon. Gas and stool are noted in the colon and rectum. No definite pneumoperitoneum noted on this single supine image. Surgical staples are noted in the lower abdomen and pelvis near the midline. IVC filter in position with tip projecting over the superior endplate of L2. IMPRESSION: 1. Nonobstructive bowel gas pattern. 2. No pneumoperitoneum. 3. Postoperative changes and support apparatus, as above. Electronically Signed   By: Trudie Reed M.D.   On: 02/08/2023 06:38        Scheduled Meds:  metoprolol tartrate  50 mg Oral BID   pantoprazole (PROTONIX) IV  40 mg Intravenous Q24H   Continuous Infusions:  0.9 % NaCl with KCl 20 mEq / L 100 mL/hr at 02/09/23 0117     LOS: 0 days    Time spent: 35 minutes    Austina Constantin A Jaquel Coomer, MD [facility]   If 7PM-7AM, please contact night-coverage [email or web address]  02/09/2023, 8:17 AM

## 2023-02-10 DIAGNOSIS — R109 Unspecified abdominal pain: Secondary | ICD-10-CM | POA: Diagnosis not present

## 2023-02-10 LAB — CBC
HCT: 32.9 % — ABNORMAL LOW (ref 36.0–46.0)
Hemoglobin: 10.9 g/dL — ABNORMAL LOW (ref 12.0–15.0)
MCH: 27.6 pg (ref 26.0–34.0)
MCHC: 33.1 g/dL (ref 30.0–36.0)
MCV: 83.3 fL (ref 80.0–100.0)
Platelets: 576 10*3/uL — ABNORMAL HIGH (ref 150–400)
RBC: 3.95 MIL/uL (ref 3.87–5.11)
RDW: 17.7 % — ABNORMAL HIGH (ref 11.5–15.5)
WBC: 13 10*3/uL — ABNORMAL HIGH (ref 4.0–10.5)
nRBC: 0.5 % — ABNORMAL HIGH (ref 0.0–0.2)

## 2023-02-10 LAB — BASIC METABOLIC PANEL
Anion gap: 14 (ref 5–15)
BUN: <5 mg/dL — ABNORMAL LOW (ref 6–20)
CO2: 19 mmol/L — ABNORMAL LOW (ref 22–32)
Calcium: 8.1 mg/dL — ABNORMAL LOW (ref 8.9–10.3)
Chloride: 87 mmol/L — ABNORMAL LOW (ref 98–111)
Creatinine, Ser: 0.51 mg/dL (ref 0.44–1.00)
GFR, Estimated: >60 mL/min (ref >60)
Glucose, Bld: 118 mg/dL — ABNORMAL HIGH (ref 70–99)
Potassium: 3.9 mmol/L (ref 3.5–5.1)
Sodium: 120 mmol/L — ABNORMAL LOW (ref 135–145)

## 2023-02-10 LAB — GLUCOSE, CAPILLARY
Glucose-Capillary: 103 mg/dL — ABNORMAL HIGH (ref 70–99)
Glucose-Capillary: 106 mg/dL — ABNORMAL HIGH (ref 70–99)
Glucose-Capillary: 108 mg/dL — ABNORMAL HIGH (ref 70–99)
Glucose-Capillary: 118 mg/dL — ABNORMAL HIGH (ref 70–99)
Glucose-Capillary: 79 mg/dL (ref 70–99)
Glucose-Capillary: 90 mg/dL (ref 70–99)

## 2023-02-10 LAB — SODIUM
Sodium: 122 mmol/L — ABNORMAL LOW (ref 135–145)
Sodium: 124 mmol/L — ABNORMAL LOW (ref 135–145)
Sodium: 126 mmol/L — ABNORMAL LOW (ref 135–145)
Sodium: 125 mmol/L — ABNORMAL LOW (ref 135–145)

## 2023-02-10 LAB — MAGNESIUM: Magnesium: 1.6 mg/dL — ABNORMAL LOW (ref 1.7–2.4)

## 2023-02-10 MED ORDER — METOCLOPRAMIDE HCL 5 MG/ML IJ SOLN
5.0000 mg | Freq: Three times a day (TID) | INTRAMUSCULAR | Status: DC
  Administered 2023-02-10 – 2023-02-15 (×16): 5 mg via INTRAVENOUS
  Filled 2023-02-10 (×16): qty 2

## 2023-02-10 MED ORDER — SODIUM CHLORIDE 0.9 % IV SOLN: INTRAVENOUS | Status: DC

## 2023-02-10 MED ORDER — SODIUM CHLORIDE 0.9 % IV BOLUS
500.0000 mL | Freq: Once | INTRAVENOUS | Status: AC
  Administered 2023-02-10: 500 mL via INTRAVENOUS

## 2023-02-10 MED ORDER — PANTOPRAZOLE SODIUM 40 MG IV SOLR
40.0000 mg | Freq: Two times a day (BID) | INTRAVENOUS | Status: DC
  Administered 2023-02-10 – 2023-02-14 (×9): 40 mg via INTRAVENOUS
  Filled 2023-02-10 (×9): qty 10

## 2023-02-10 MED ORDER — MAGNESIUM SULFATE 2 GM/50ML IV SOLN
2.0000 g | Freq: Once | INTRAVENOUS | Status: AC
  Administered 2023-02-10: 2 g via INTRAVENOUS
  Filled 2023-02-10: qty 50

## 2023-02-10 NOTE — Progress Notes (Signed)
PROGRESS NOTE    Aron Sara  QMV:[id number] DOB: [birth year] DOA: 02/08/2023 PCP: Pcp, No   Brief Narrative: 51 with past medical history significant for Crohn's disease, vulvar abscess, cystitis, bacterial pneumonia, history of cardioembolic CVA 2023, with resultant left-sided hemiparesis, hypertension, generalized anxiety disorder, iron deficiency anemia, thrombocytopenia, rheumatic mitral stenosis, tobacco use disorder, DVT and PE in February this year, no compliance with apixaban, recently admission for SBO requiring NG tube, TPN, she left AMA during that hospitalization subsequently returned and was taken to the OR for exploratory laparotomy, LOA , resection of ileocolic anastomosis with new ileocolic anastomosis for SBO secondary to stricture by Dr. Magnus Ivan.  She was discharged the day prior to this admission and she returns with worsening abdominal pain nausea and vomiting.  CT abdomen and pelvis with a stable surgical changes from recent ileocolonic anastomosis, no obstructive finding.  Scattered free fluid in the abdomen and pelvis.  No finding or intra-abdominal abscess.  Persistent bilateral pleural effusion.   Assessment & Plan:   Principal Problem:   Intractable abdominal pain Active Problems:   Essential hypertension   History of pulmonary embolism   Crohn's disease (HCC)   Malnutrition of moderate degree   Left hemiparesis (HCC)   Tobacco use disorder   Nicotine dependence, cigarettes, uncomplicated   Normocytic anemia   Thrombocytosis  1-Intractable abdominal pain, recent SBO,LOA SP resection of ileocolic anastomosis with new ileocolic anastomosis on 6/17 by Dr Magnus Ivan.  CT abdomen pelvis 6/27; negative for abscess or obstruction.  Report diarrhea. Check for C diff.  Surgery consulted. Recommend to advanced diet as tolerated.  PRN Zofran, IV dilaudid. Careful with oversedation.  Having nausea today,. Relates abdominal pain improving.  Plan to start IV Reglan.     2-Crohn's disease: If abdominal pain doesn't improved will consider GI consultation.  Diarrhea; check for C diff.   3-Essential hypertension:Uncontrolled HTN Started Norvasc.  PRN Hydralazine.   Hyponatremia: acute Change IV fluids to NS IV bolus.  Check Sodium every 4 hours.   Oral thrush;  Started  Nystatin and IV diflucan.   Pleural effusion: chest x ray showed BL small pleural effusion. Volume loss left Hemithorax.  Careful  with sedatives.  Incentive spirometry.  Pain medication dose reduce.    Acute Blood Loss anemia during last admission.   Normocytic anemia: Required multiples Blood transfusion last admission.  Hb remain stable. Ok to resume anticoagulation per surgery. Will start with Lovenox prophylaxis dose./    Chronic Left hemiparesis: History of CVA PT consult.   Pulmonary embolism on the right and history of DVT: plan to resume Lovenox prophylaxis dose, ok to resume anticoagulation per sx.  If hb remain stable will subsequently transition to eliquis tomorrow.   Tobacco use disorder: Counseling   Malnutrition of moderate degree: Started on Supplement.    Leukocytosis; cover for PNA, report cough, chest x ray with volume loss left hemothorax, pleural effusion.  Started ceftriaxone.      Estimated body mass index is 25.77 kg/m as calculated from the following:   Height as of 02/01/23: 5\' 2"  (1.575 m).   Weight as of 02/07/23: 63.9 kg.   DVT prophylaxis: Lovenox Code Status: Full code Family Communication: Disposition Plan:  Status is: Observation The patient will require care spanning > 2 midnights and should be moved to inpatient because: management of abdominal pain and poor oral intake.     Consultants:  Surgery   Procedures:  none  Antimicrobials:    Subjective: She is feeling nausea,  having dry heaving.  She relates abdominal pain is a little better.    Objective: Vitals:   02/09/23 1355 02/09/23 2010 02/10/23 0448  02/10/23 1029  BP: (!) 183/110 (!) 172/106 (!) 156/102 (!) 169/97  Pulse: 90 86 78 72  Resp: 18 18 18 18   Temp: 98.9 F (37.2 C) 98 F (36.7 C) 98.4 F (36.9 C) 98.2 F (36.8 C)  TempSrc: Oral Oral Oral Oral  SpO2: 98% 94% 100%     Intake/Output Summary (Last 24 hours) at 02/10/2023 1321 Last data filed at 02/10/2023 1100 Gross per 24 hour  Intake 1700 ml  Output 4700 ml  Net -3000 ml    There were no vitals filed for this visit.  Examination:  General exam:NAD Respiratory system: CTA Cardiovascular system: S 1, S 2 RRR Gastrointestinal system: BS present, soft, tender, staples in place.  Central nervous system: Alert Extremities: no edema.    Data Reviewed: I have personally reviewed following labs and imaging studies  CBC: Recent Labs  Lab 02/05/23 0308 02/06/23 0335 02/07/23 0519 02/08/23 0559 02/09/23 0445 02/10/23 0958  WBC 9.1 9.4 8.7 14.5* 14.2* 13.0*  NEUTROABS 5.3 6.2 5.7 12.5*  --   --   HGB 8.4* 8.2* 7.7* 10.9* 9.1* 10.9*  HCT 26.8* 25.9* 24.8* 34.4* 29.2* 32.9*  MCV 86.5 86.3 87.0 88.2 87.2 83.3  PLT 223 267 281 457* 467* 576*    Basic Metabolic Panel: Recent Labs  Lab 02/04/23 0254 02/05/23 0308 02/06/23 0335 02/08/23 0559 02/09/23 0445 02/10/23 0604 02/10/23 0958  NA 137 140  --  135 131* 120* 122*  K 3.7 3.6 3.7 3.8 4.5 3.9  --   CL 104 106  --  100 102 87*  --   CO2 26 27  --  23 20* 19*  --   GLUCOSE 109* 110*  --  98 105* 118*  --   BUN 25* 26*  --  11 7 <5*  --   CREATININE 0.50 0.61  --  0.68 0.61 0.51  --   CALCIUM 7.8* 7.8*  --  8.7* 7.8* 8.1*  --   MG 1.5* 1.6* 1.7  --  1.5* 1.6*  --   PHOS 3.8 4.4 3.4  --   --   --   --     GFR: Estimated Creatinine Clearance: 73 mL/min (by C-G formula based on SCr of 0.51 mg/dL). Liver Function Tests: Recent Labs  Lab 02/05/23 0308 02/08/23 0559 02/09/23 0445  AST 26 28 24   ALT 35 38 33  ALKPHOS 169* 142* 103  BILITOT 0.5 0.9 1.1  PROT 4.8* 7.5 6.2*  ALBUMIN 1.9* 2.9* 2.4*     No results for input(s): "LIPASE", "AMYLASE" in the last 168 hours. No results for input(s): "AMMONIA" in the last 168 hours. Coagulation Profile: No results for input(s): "INR", "PROTIME" in the last 168 hours. Cardiac Enzymes: No results for input(s): "CKTOTAL", "CKMB", "CKMBINDEX", "TROPONINI" in the last 168 hours. BNP (last 3 results) No results for input(s): "PROBNP" in the last 8760 hours. HbA1C: No results for input(s): "HGBA1C" in the last 72 hours. CBG: Recent Labs  Lab 02/09/23 2018 02/10/23 0016 02/10/23 0407 02/10/23 0720 02/10/23 1206  GLUCAP 111* 108* 118* 106* 90    Lipid Profile: No results for input(s): "CHOL", "HDL", "LDLCALC", "TRIG", "CHOLHDL", "LDLDIRECT" in the last 72 hours. Thyroid Function Tests: No results for input(s): "TSH", "T4TOTAL", "FREET4", "T3FREE", "THYROIDAB" in the last 72 hours. Anemia Panel: No results for input(s): "VITAMINB12", "FOLATE", "  FERRITIN", "TIBC", "IRON", "RETICCTPCT" in the last 72 hours.  Sepsis Labs: Recent Labs  Lab 02/05/23 0308  PROCALCITON 2.17     Recent Results (from the past 240 hour(s))  Urine Culture     Status: Abnormal   Collection Time: 01/31/23  2:22 PM   Specimen: Urine, Clean Catch  Result Value Ref Range Status   Specimen Description   Final    URINE, CLEAN CATCH Performed at [facility] [address] [place], [place] [address]    Special Requests   Final    NONE Performed at [facility], [address], [place], [place] [address]    Culture MULTIPLE SPECIES PRESENT, SUGGEST RECOLLECTION (A)  Final   Report Status 02/01/2023 FINAL  Final  Culture, blood (Routine X 2) w Reflex to ID Panel     Status: None   Collection Time: 02/01/23  9:51 AM   Specimen: BLOOD RIGHT HAND  Result Value Ref Range Status   Specimen Description   Final    BLOOD RIGHT HAND Performed at [facility], [address] [place], [place] [address]     Special Requests   Final    BOTTLES DRAWN AEROBIC AND ANAEROBIC Blood Culture adequate volume Performed at [facility], [address] [place], [place] [address]    Culture   Final    NO GROWTH 5 DAYS Performed at [facility], [address], [place], [place] [address]    Report Status 02/06/2023 FINAL  Final  Culture, blood (Routine X 2) w Reflex to ID Panel     Status: None   Collection Time: 02/01/23  9:52 AM   Specimen: Right Antecubital; Blood  Result Value Ref Range Status   Specimen Description   Final    RIGHT ANTECUBITAL BLOOD Performed at [facility], [address] [place], [place] [address]    Special Requests   Final    BOTTLES DRAWN AEROBIC AND ANAEROBIC Blood Culture adequate volume Performed at [facility], [address] [place], [place] [address]    Culture   Final    NO GROWTH 5 DAYS Performed at [facility], [address], [place], [place] [address]    Report Status 02/06/2023 FINAL  Final  MRSA Next Gen by PCR, Nasal     Status: None   Collection Time: 02/01/23  9:52 AM   Specimen: Nasal Mucosa; Nasal Swab  Result Value Ref Range Status   MRSA by PCR Next Gen NOT DETECTED NOT DETECTED Final    Comment: (NOTE) The GeneXpert MRSA Assay (FDA approved for NASAL specimens only), is one component of a comprehensive MRSA colonization surveillance program. It is not intended to diagnose MRSA infection nor to guide or monitor treatment for MRSA infections. Test performance is not FDA approved in patients less than 55 years old. Performed at [facility], [address], [place], [place] [address]          Radiology Studies: DG Chest 2 View  Result Date: 02/09/2023 CLINICAL DATA:  Pleural effusion EXAM: CHEST - 2 VIEW COMPARISON:  CT 02/01/2023 and x-ray FINDINGS: Small pleural effusions, left-greater-than-right. Additional deformity along the left midthorax with presumed  lateral pleural thickening. Volume loss of the left hemithorax. No edema or pneumothorax. Stable cardiopericardial silhouette. Air-fluid level along the stomach. IVC filter seen at the edge of the imaging field on the lateral view. Previous PICC no longer seen  IMPRESSION: Previous PICC no longer seen. Otherwise no significant interval change when adjusting for technique Electronically Signed   By: Karen Kays M.D.   On: 02/09/2023 10:44        Scheduled Meds:  amLODipine  5 mg Oral Daily   enoxaparin (LOVENOX) injection  40 mg Subcutaneous Q24H   feeding supplement  1 Container Oral TID BM   metoCLOPramide (REGLAN) injection  5 mg Intravenous Q8H   metoprolol tartrate  50 mg Oral BID   nystatin  5 mL Oral QID   pantoprazole (PROTONIX) IV  40 mg Intravenous Q12H   Continuous Infusions:  sodium chloride 125 mL/hr at 02/10/23 1042   cefTRIAXone (ROCEPHIN)  IV Stopped (02/09/23 1248)   fluconazole (DIFLUCAN) IV Stopped (02/09/23 1507)     LOS: 1 day    Time spent: 35 minutes    Robb Sibal A Toini Failla, MD [facility]   If 7PM-7AM, please contact night-coverage [email or web address]  02/10/2023, 1:21 PM

## 2023-02-10 NOTE — Plan of Care (Signed)

## 2023-02-10 NOTE — Evaluation (Signed)
Physical Therapy Evaluation Patient Details Name: Tara Berger MRN: [id number] DOB: 1971/02/25 Today's Date: 02/10/2023  History of Present Illness  52 y.o. 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 Recent admission for SBO s/p exploratory laparotomy, LOA, resection of iliocolic anastamosis 01/29/23. Pt discharged and returned 02/08/23 with worsening abdominal pain nausea and vomiting.  Clinical Impression  Pt admitted with above diagnosis.  Pt currently with functional limitations due to the deficits listed below (see PT Problem List). Pt will benefit from acute skilled PT to increase their independence and safety with mobility to allow discharge.  Pt returned to hospital after recent discharge and was ambulatory at that time.  Pt currently requiring min-mod assist for transfers and not able to ambulate today.  Pt also presents with decreased cognition including requiring max multimodal cues, slow processing, difficulty problem solving however RN reports pt had received pain meds earlier.  Pt returned home with HHPT however due to increased difficulty today would recommend post acute rehab if no assist is available at home.          Recommendations for follow up therapy are one component of a multi-disciplinary discharge planning process, led by the attending physician.  Recommendations may be updated based on patient status, additional functional criteria and insurance  authorization.  Follow Up Recommendations       Assistance Recommended at Discharge Intermittent Supervision/Assistance  Patient can return home with the following  A little help with walking and/or transfers;A little help with bathing/dressing/bathroom;Assist for transportation;Help with stairs or ramp for entrance;Assistance with cooking/housework    Equipment Recommendations Rolling walker (2 wheels) (recommended last admission)  Recommendations for Other Services       Functional Status Assessment Patient has had a recent decline in their functional status and demonstrates the ability to make significant improvements in function in a reasonable and predictable amount of time.     Precautions / Restrictions Precautions Precautions: Fall Precaution Comments: recent abdominal surgery, hx L hemiparesis      Mobility  Bed Mobility Overal bed mobility: Needs Assistance Bed Mobility: Supine to Sit, Sit to Supine     Supine to sit: Mod assist, HOB elevated Sit to supine: Max assist   General bed mobility comments: max cues for technique, pt with difficulty processing as well as performing, assist for scooting to EOB; assist for lower body onto bed and total assist to reposition in supine    Transfers Overall transfer level: Needs assistance Equipment used: Rolling walker (2 wheels) Transfers: Sit to/from Stand, Bed to chair/wheelchair/BSC Sit to Stand: Mod assist Stand pivot transfers: Min assist         General transfer comment: multimodal cues for technique, assist to rise and steady as well as control descent; pt with crossed LEs upon pivot to [facility], pt with small shuffling in place upon return to bed    Ambulation/Gait                  Stairs            Wheelchair Mobility    Modified Rankin (Stroke  Patients Only)       Balance Overall balance assessment: Needs assistance Sitting-balance support: Feet supported, No upper extremity  supported Sitting balance-Leahy Scale: Fair     Standing balance support: During functional activity, Reliant on assistive device for balance, Bilateral upper extremity supported Standing balance-Leahy Scale: Poor                               Pertinent Vitals/Pain Pain Assessment Pain Assessment: No/denies pain Pain Intervention(s): Premedicated before session (RN reports pt received pain meds)    Home Living Family/patient expects to be discharged to:: Private residence Living Arrangements: Alone Available Help at Discharge: Family;Available PRN/intermittently Type of Home: Apartment Home Access: Elevator       Home Layout: One level Home Equipment: None Additional Comments: information from previous admission    Prior Function Prior Level of Function : Independent/Modified Independent             Mobility Comments: doesn't drive, daughters and brother assist with transportation; walks without assistive device       Hand Dominance        Extremity/Trunk Assessment        Lower Extremity Assessment Lower Extremity Assessment: LLE deficits/detail;Generalized weakness LLE Deficits / Details: reports hx of residual weakness from previous CVA    Cervical / Trunk Assessment Cervical / Trunk Assessment: Normal  Communication   Communication: No difficulties  Cognition Arousal/Alertness: Awake/alert Behavior During Therapy: WFL for tasks assessed/performed, Flat affect Overall Cognitive Status: No family/caregiver present to determine baseline cognitive functioning Area of Impairment: Following commands, Safety/judgement, Problem solving                       Following Commands: Follows one step commands inconsistently Safety/Judgement: Decreased awareness of deficits, Decreased awareness of safety   Problem Solving: Difficulty sequencing, Requires verbal cues, Requires tactile cues, Slow processing General Comments: RN reports pt  received pain meds earlier and they may be cause of decreased cognition at this time        General Comments      Exercises     Assessment/Plan    PT Assessment Patient needs continued PT services  PT Problem List Decreased activity tolerance;Decreased mobility;Decreased strength;Decreased cognition;Decreased balance       PT Treatment Interventions DME instruction;Gait training;Therapeutic activities;Functional mobility training;Therapeutic exercise;Patient/family education;Balance training    PT Goals (Current goals can be found in the Care Plan section)  Acute Rehab PT Goals PT Goal Formulation: With patient Time For Goal Achievement: 02/23/23 Potential to Achieve Goals: Good    Frequency Min 1X/week     Co-evaluation               AM-PAC PT "6 Clicks" Mobility  Outcome Measure Help needed turning from your back to your side while in a flat bed without using bedrails?: A Little Help needed moving from lying on your back to sitting on the side of a flat bed without using bedrails?: A Lot Help needed moving to and from a bed to a chair (including a wheelchair)?: A Lot Help needed standing up from a chair using your arms (e.g., wheelchair or bedside chair)?: A Lot Help needed to walk in hospital room?: Total Help needed climbing 3-5 steps with a railing? : Total 6 Click Score: 11    End of Session Equipment Utilized During Treatment: Gait belt Activity Tolerance: Patient tolerated treatment well Patient left: in bed;with  call bell/phone within reach;with bed alarm set Nurse Communication: Mobility status PT Visit Diagnosis: Other abnormalities of gait and mobility (R26.89);Pain    Time: 1610-9604 PT Time Calculation (min) (ACUTE ONLY): 19 min   Charges:   PT Evaluation $PT Eval Low Complexity: 1 Low        Kati PT, DPT Physical Therapist Acute Rehabilitation Services Office: [phone]   Janan Halter Payson 02/10/2023, 12:35 PM

## 2023-02-11 DIAGNOSIS — R109 Unspecified abdominal pain: Secondary | ICD-10-CM | POA: Diagnosis not present

## 2023-02-11 LAB — BASIC METABOLIC PANEL
Anion gap: 12 (ref 5–15)
BUN: <5 mg/dL — ABNORMAL LOW (ref 6–20)
CO2: 23 mmol/L (ref 22–32)
Calcium: 8 mg/dL — ABNORMAL LOW (ref 8.9–10.3)
Chloride: 93 mmol/L — ABNORMAL LOW (ref 98–111)
Creatinine, Ser: 0.49 mg/dL (ref 0.44–1.00)
GFR, Estimated: >60 mL/min (ref >60)
Glucose, Bld: 102 mg/dL — ABNORMAL HIGH (ref 70–99)
Potassium: 2.7 mmol/L — CL (ref 3.5–5.1)
Sodium: 128 mmol/L — ABNORMAL LOW (ref 135–145)

## 2023-02-11 LAB — CBC
HCT: 35.3 % — ABNORMAL LOW (ref 36.0–46.0)
Hemoglobin: 11.6 g/dL — ABNORMAL LOW (ref 12.0–15.0)
MCH: 27.2 pg (ref 26.0–34.0)
MCHC: 32.9 g/dL (ref 30.0–36.0)
MCV: 82.9 fL (ref 80.0–100.0)
Platelets: 676 10*3/uL — ABNORMAL HIGH (ref 150–400)
RBC: 4.26 MIL/uL (ref 3.87–5.11)
RDW: 17.5 % — ABNORMAL HIGH (ref 11.5–15.5)
WBC: 13.2 10*3/uL — ABNORMAL HIGH (ref 4.0–10.5)
nRBC: 0 % (ref 0.0–0.2)

## 2023-02-11 LAB — GLUCOSE, CAPILLARY
Glucose-Capillary: 106 mg/dL — ABNORMAL HIGH (ref 70–99)
Glucose-Capillary: 83 mg/dL (ref 70–99)
Glucose-Capillary: 86 mg/dL (ref 70–99)
Glucose-Capillary: 91 mg/dL (ref 70–99)
Glucose-Capillary: 93 mg/dL (ref 70–99)
Glucose-Capillary: 97 mg/dL (ref 70–99)

## 2023-02-11 LAB — SODIUM
Sodium: 126 mmol/L — ABNORMAL LOW (ref 135–145)
Sodium: 126 mmol/L — ABNORMAL LOW (ref 135–145)

## 2023-02-11 MED ORDER — ORAL CARE MOUTH RINSE: 15.0000 mL | OROMUCOSAL | Status: DC | PRN

## 2023-02-11 MED ORDER — POTASSIUM CHLORIDE 10 MEQ/100ML IV SOLN
10.0000 meq | INTRAVENOUS | Status: AC
  Administered 2023-02-11 (×6): 10 meq via INTRAVENOUS
  Filled 2023-02-11 (×2): qty 100

## 2023-02-11 MED ORDER — POTASSIUM CHLORIDE CRYS ER 20 MEQ PO TBCR
40.0000 meq | EXTENDED_RELEASE_TABLET | Freq: Once | ORAL | Status: AC
  Administered 2023-02-11: 40 meq via ORAL
  Filled 2023-02-11: qty 2

## 2023-02-11 NOTE — Progress Notes (Signed)
Pt has had a severe decline in mobility compared to previous admission. Unable to stand for more than a few moments.   Attempted to transition pt to chair for lunch. Pt unable stand for more than a few moments. Could not pivot. Fatigues easily.  HIGH FALL RISK.

## 2023-02-11 NOTE — Progress Notes (Signed)
Mobility Specialist - Progress Note   02/11/23 0923  Mobility  Activity Stood at bedside  Level of Assistance Contact guard assist, steadying assist  Assistive Device Front wheel walker  Activity Response Tolerated fair  Mobility Referral Yes  $Mobility charge 1 Mobility  Mobility Specialist Start Time (ACUTE ONLY) 0900  Mobility Specialist Stop Time (ACUTE ONLY) [id number]  Mobility Specialist Time Calculation (min) (ACUTE ONLY) 23 min   Pt received in bed and agreeable to mobility. Pt was MinA from STS & contact during standing due to unsteadiness. Once standing, pt stated that she could not walk. Assisted pt w/ standing at bedside for ~36min. No complaints during session. Pt to bed after session with all needs met. Bed alarm on.   [facility]

## 2023-02-11 NOTE — Progress Notes (Signed)
PROGRESS NOTE    Tara Berger  ZOX:[id number] DOB: 10/26/1970 DOA: 02/08/2023 PCP: Pcp, No   Brief Narrative: 51 with past medical history significant for Crohn's disease, vulvar abscess, cystitis, bacterial pneumonia, history of cardioembolic CVA 2023, with resultant left-sided hemiparesis, hypertension, generalized anxiety disorder, iron deficiency anemia, thrombocytopenia, rheumatic mitral stenosis, tobacco use disorder, DVT and PE in February this year, no compliance with apixaban, recently admission for SBO requiring NG tube, TPN, she left AMA during that hospitalization subsequently returned and was taken to the OR for exploratory laparotomy, LOA , resection of ileocolic anastomosis with new ileocolic anastomosis for SBO secondary to stricture by Dr. Magnus Ivan.  She was discharged the day prior to this admission and she returns with worsening abdominal pain nausea and vomiting.  CT abdomen and pelvis with a stable surgical changes from recent ileocolonic anastomosis, no obstructive finding.  Scattered free fluid in the abdomen and pelvis.  No finding or intra-abdominal abscess.  Persistent bilateral pleural effusion.   Assessment & Plan:   Principal Problem:   Intractable abdominal pain Active Problems:   Essential hypertension   History of pulmonary embolism   Crohn's disease (HCC)   Malnutrition of moderate degree   Left hemiparesis (HCC)   Tobacco use disorder   Nicotine dependence, cigarettes, uncomplicated   Normocytic anemia   Thrombocytosis  1-Intractable abdominal pain, recent SBO,LOA SP resection of ileocolic anastomosis with new ileocolic anastomosis on 6/17 by Dr Magnus Ivan.  CT abdomen pelvis 6/27; negative for abscess or obstruction.  Report diarrhea. Unable to send stool sample no further diarrhea.  Surgery consulted. Recommend to advanced diet as tolerated.  PRN Zofran, IV dilaudid. Careful with oversedation.  Report abdominal pain improving.  Continue with  schedule  IV Reglan.  Still having nausea.   2-Crohn's disease: If abdominal pain doesn't improved will consider GI consultation.  She report abdominal pain, pain is more in her skin surgical site. Doesn't feel like her Crohn's   3-Essential hypertension:Uncontrolled HTN Started Norvasc.  PRN Hydralazine.   Hyponatremia: acute Continue with  IV fluids  NS IV bolus.  Sodium every 4 hours.   Oral thrush;  Started  Nystatin and IV diflucan.   Pleural effusion: chest x ray showed BL small pleural effusion. Volume loss left Hemithorax.  Careful  with sedatives.  Incentive spirometry.  Pain medication dose reduce.    Acute Blood Loss anemia during last admission.   Normocytic anemia: Required multiples Blood transfusion last admission.  Hb remain stable. Ok to resume anticoagulation per surgery. Will start with Lovenox prophylaxis dose./  Hb stable.   Chronic Left hemiparesis: History of CVA PT consult.   Pulmonary embolism on the right and history of DVT: plan to resume Lovenox prophylaxis dose, ok to resume anticoagulation per sx.  If hb remain stable will subsequently transition to eliquis tomorrow.   Tobacco use disorder: Counseling   Malnutrition of moderate degree: Started on Supplement.    Leukocytosis; cover for PNA, report cough, chest x ray with volume loss left hemothorax, pleural effusion.  Started ceftriaxone.   Hypokalemia; replete IV    Estimated body mass index is 25.77 kg/m as calculated from the following:   Height as of this encounter: 5\' 2"  (1.575 m).   Weight as of 02/07/23: 63.9 kg.   DVT prophylaxis: Lovenox Code Status: Full code Family Communication: Disposition Plan:  Status is: Observation The patient will require care spanning > 2 midnights and should be moved to inpatient because: management of abdominal pain and poor  oral intake.     Consultants:  Surgery   Procedures:  none  Antimicrobials:    Subjective: Abdominal  pain better. Report nausea,. Not tolerating liquids.  Objective: Vitals:   02/10/23 1029 02/10/23 1423 02/10/23 2017 02/11/23 1313  BP: (!) 169/97 (!) 150/94 (!) 154/89 (!) 139/90  Pulse: 72 92 94 87  Resp: 18 18 18 16   Temp: 98.2 F (36.8 C) 98.1 F (36.7 C) 98.5 F (36.9 C) 98.7 F (37.1 C)  TempSrc: Oral Oral Oral Oral  SpO2:   100% 99%  Height:   5\' 2"  (1.575 m)     Intake/Output Summary (Last 24 hours) at 02/11/2023 1344 Last data filed at 02/11/2023 1000 Gross per 24 hour  Intake 2207.15 ml  Output 3700 ml  Net -1492.85 ml    There were no vitals filed for this visit.  Examination:  General exam: NAD Respiratory system: CTA Cardiovascular system: S 1, S 2 RRR Gastrointestinal system: BS present, soft,  staples in place.  Central nervous system: alert, answer questions.  Extremities: No edema   Data Reviewed: I have personally reviewed following labs and imaging studies  CBC: Recent Labs  Lab 02/05/23 0308 02/06/23 0335 02/07/23 0519 02/08/23 0559 02/09/23 0445 02/10/23 0958 02/11/23 0911  WBC 9.1 9.4 8.7 14.5* 14.2* 13.0* 13.2*  NEUTROABS 5.3 6.2 5.7 12.5*  --   --   --   HGB 8.4* 8.2* 7.7* 10.9* 9.1* 10.9* 11.6*  HCT 26.8* 25.9* 24.8* 34.4* 29.2* 32.9* 35.3*  MCV 86.5 86.3 87.0 88.2 87.2 83.3 82.9  PLT 223 267 281 457* 467* 576* 676*    Basic Metabolic Panel: Recent Labs  Lab 02/05/23 0308 02/06/23 0335 02/08/23 0559 02/09/23 0445 02/10/23 0604 02/10/23 0958 02/10/23 1853 02/10/23 2120 02/11/23 0146 02/11/23 0548 02/11/23 0911  NA 140  --  135 131* 120*   < > 126* 125* 126* 126* 128*  K 3.6 3.7 3.8 4.5 3.9  --   --   --   --   --  2.7*  CL 106  --  100 102 87*  --   --   --   --   --  93*  CO2 27  --  23 20* 19*  --   --   --   --   --  23  GLUCOSE 110*  --  98 105* 118*  --   --   --   --   --  102*  BUN 26*  --  11 7 <5*  --   --   --   --   --  <5*  CREATININE 0.61  --  0.68 0.61 0.51  --   --   --   --   --  0.49  CALCIUM 7.8*  --   8.7* 7.8* 8.1*  --   --   --   --   --  8.0*  MG 1.6* 1.7  --  1.5* 1.6*  --   --   --   --   --   --   PHOS 4.4 3.4  --   --   --   --   --   --   --   --   --    < > = values in this interval not displayed.    GFR: Estimated Creatinine Clearance: 73 mL/min (by C-G formula based on SCr of 0.49 mg/dL). Liver Function Tests: Recent Labs  Lab 02/05/23 0308 02/08/23 0559 02/09/23 0445  AST 26 28 24   ALT 35 38 33  ALKPHOS 169* 142* 103  BILITOT 0.5 0.9 1.1  PROT 4.8* 7.5 6.2*  ALBUMIN 1.9* 2.9* 2.4*    No results for input(s): "LIPASE", "AMYLASE" in the last 168 hours. No results for input(s): "AMMONIA" in the last 168 hours. Coagulation Profile: No results for input(s): "INR", "PROTIME" in the last 168 hours. Cardiac Enzymes: No results for input(s): "CKTOTAL", "CKMB", "CKMBINDEX", "TROPONINI" in the last 168 hours. BNP (last 3 results) No results for input(s): "PROBNP" in the last 8760 hours. HbA1C: No results for input(s): "HGBA1C" in the last 72 hours. CBG: Recent Labs  Lab 02/10/23 2020 02/11/23 0007 02/11/23 0411 02/11/23 0737 02/11/23 1138  GLUCAP 103* 93 91 106* 97    Lipid Profile: No results for input(s): "CHOL", "HDL", "LDLCALC", "TRIG", "CHOLHDL", "LDLDIRECT" in the last 72 hours. Thyroid Function Tests: No results for input(s): "TSH", "T4TOTAL", "FREET4", "T3FREE", "THYROIDAB" in the last 72 hours. Anemia Panel: No results for input(s): "VITAMINB12", "FOLATE", "FERRITIN", "TIBC", "IRON", "RETICCTPCT" in the last 72 hours.  Sepsis Labs: Recent Labs  Lab 02/05/23 0308  PROCALCITON 2.17     No results found for this or any previous visit (from the past 240 hour(s)).        Radiology Studies: No results found.      Scheduled Meds:  amLODipine  5 mg Oral Daily   enoxaparin (LOVENOX) injection  40 mg Subcutaneous Q24H   feeding supplement  1 Container Oral TID BM   metoCLOPramide (REGLAN) injection  5 mg Intravenous Q8H   metoprolol  tartrate  50 mg Oral BID   nystatin  5 mL Oral QID   pantoprazole (PROTONIX) IV  40 mg Intravenous Q12H   Continuous Infusions:  sodium chloride 125 mL/hr at 02/11/23 0800   cefTRIAXone (ROCEPHIN)  IV Stopped (02/11/23 1211)   fluconazole (DIFLUCAN) IV 200 mg (02/10/23 1324)   potassium chloride 10 mEq (02/11/23 1216)     LOS: 2 days    Time spent: 35 minutes    Lind Ausley A Daulton Harbaugh, MD [facility]   If 7PM-7AM, please contact night-coverage [email or web address]  02/11/2023, 1:44 PM

## 2023-02-11 NOTE — TOC Progression Note (Signed)
Transition of Care [facility]) - Progression Note    Patient Details  Name: Charlene Hasbun MRN: [id number] Date of Birth: 1970/08/26  Transition of Care [facility]) CM/SW Contact  Amada Jupiter, LCSW Phone Number: 02/11/2023, 2:29 PM  Clinical Narrative:     Met with pt to review dc needs and PT recommendation for SNF rehab unless 24/7 support can be provided.  Pt reports that her brother, Nailea Walling, is planning to stay with her and be her 24/7 support.  Pt does not have his contact # so have left VM for pt's sister to confirm with family that this is the confirmed plan.         Expected Discharge Plan and Services                                               Social Determinants of Health (SDOH) Interventions SDOH Screenings   Food Insecurity: No Food Insecurity (02/08/2023)  Housing: Low Risk  (02/08/2023)  Transportation Needs: No Transportation Needs (02/08/2023)  Utilities: Not At Risk (02/08/2023)  Tobacco Use: High Risk (02/03/2023)    Readmission Risk Interventions    02/07/2023   11:44 AM  Readmission Risk Prevention Plan  Transportation Screening Complete  PCP or Specialist Appt within 3-5 Days Complete  HRI or Home Care Consult Complete  Social Work Consult for Recovery Care Planning/Counseling Complete  Palliative Care Screening Not Applicable  Medication Review [employer]) Complete

## 2023-02-12 ENCOUNTER — Inpatient Hospital Stay (HOSPITAL_COMMUNITY): Payer: Medicaid Other

## 2023-02-12 DIAGNOSIS — R109 Unspecified abdominal pain: Secondary | ICD-10-CM | POA: Diagnosis not present

## 2023-02-12 LAB — BLOOD GAS, ARTERIAL
Acid-base deficit: 0.6 mmol/L (ref 0.0–2.0)
Bicarbonate: 23.2 mmol/L (ref 20.0–28.0)
Drawn by: 20012
FIO2: 21 %
O2 Saturation: 97.6 %
Patient temperature: 37.1
pCO2 arterial: 35 mmHg (ref 32–48)
pH, Arterial: 7.43 (ref 7.35–7.45)
pO2, Arterial: 82 mmHg — ABNORMAL LOW (ref 83–108)

## 2023-02-12 LAB — BASIC METABOLIC PANEL
Anion gap: 10 (ref 5–15)
BUN: <5 mg/dL — ABNORMAL LOW (ref 6–20)
CO2: 20 mmol/L — ABNORMAL LOW (ref 22–32)
Calcium: 8.2 mg/dL — ABNORMAL LOW (ref 8.9–10.3)
Chloride: 99 mmol/L (ref 98–111)
Creatinine, Ser: 0.46 mg/dL (ref 0.44–1.00)
GFR, Estimated: >60 mL/min (ref >60)
Glucose, Bld: 99 mg/dL (ref 70–99)
Potassium: 3.8 mmol/L (ref 3.5–5.1)
Sodium: 129 mmol/L — ABNORMAL LOW (ref 135–145)

## 2023-02-12 LAB — TRANSFUSION REACTION: Post RXN DAT IgG: POSITIVE

## 2023-02-12 LAB — CBC
HCT: 35.2 % — ABNORMAL LOW (ref 36.0–46.0)
Hemoglobin: 11.3 g/dL — ABNORMAL LOW (ref 12.0–15.0)
MCH: 27.4 pg (ref 26.0–34.0)
MCHC: 32.1 g/dL (ref 30.0–36.0)
MCV: 85.2 fL (ref 80.0–100.0)
Platelets: 695 10*3/uL — ABNORMAL HIGH (ref 150–400)
RBC: 4.13 MIL/uL (ref 3.87–5.11)
RDW: 17.8 % — ABNORMAL HIGH (ref 11.5–15.5)
WBC: 14.1 10*3/uL — ABNORMAL HIGH (ref 4.0–10.5)
nRBC: 0 % (ref 0.0–0.2)

## 2023-02-12 LAB — GLUCOSE, CAPILLARY
Glucose-Capillary: 91 mg/dL (ref 70–99)
Glucose-Capillary: 89 mg/dL (ref 70–99)
Glucose-Capillary: 97 mg/dL (ref 70–99)
Glucose-Capillary: 95 mg/dL (ref 70–99)
Glucose-Capillary: 70 mg/dL (ref 70–99)
Glucose-Capillary: 92 mg/dL (ref 70–99)

## 2023-02-12 LAB — AMMONIA: Ammonia: 24 umol/L (ref 9–35)

## 2023-02-12 LAB — VITAMIN B12: Vitamin B-12: 434 pg/mL (ref 180–914)

## 2023-02-12 MED ORDER — OXYCODONE HCL 5 MG PO TABS
5.0000 mg | ORAL_TABLET | ORAL | Status: DC | PRN
  Administered 2023-02-12: 10 mg via ORAL
  Administered 2023-02-12: 5 mg via ORAL
  Administered 2023-02-13 – 2023-02-15 (×6): 10 mg via ORAL
  Filled 2023-02-12 (×4): qty 2
  Filled 2023-02-12: qty 1
  Filled 2023-02-12 (×3): qty 2

## 2023-02-12 MED ORDER — DOCUSATE SODIUM 100 MG PO CAPS: 100.0000 mg | ORAL_CAPSULE | Freq: Two times a day (BID) | ORAL | Status: DC | PRN

## 2023-02-12 MED ORDER — POTASSIUM CHLORIDE 10 MEQ/100ML IV SOLN
10.0000 meq | INTRAVENOUS | Status: AC
  Administered 2023-02-12 (×2): 10 meq via INTRAVENOUS
  Filled 2023-02-12: qty 100

## 2023-02-12 MED ORDER — POLYETHYLENE GLYCOL 3350 17 G PO PACK
17.0000 g | PACK | Freq: Every day | ORAL | Status: DC
  Administered 2023-02-12 – 2023-02-15 (×3): 17 g via ORAL
  Filled 2023-02-12 (×4): qty 1

## 2023-02-12 MED ORDER — KATE FARMS STANDARD 1.4 PO LIQD
325.0000 mL | Freq: Two times a day (BID) | ORAL | Status: DC
  Administered 2023-02-14 – 2023-02-15 (×2): 325 mL via ORAL
  Filled 2023-02-12 (×7): qty 325

## 2023-02-12 MED ORDER — SODIUM CHLORIDE 0.9 % IV SOLN
2.0000 g | Freq: Every day | INTRAVENOUS | Status: AC
  Administered 2023-02-12 – 2023-02-13 (×2): 2 g via INTRAVENOUS
  Filled 2023-02-12 (×2): qty 20

## 2023-02-12 MED ORDER — SACCHAROMYCES BOULARDII 250 MG PO CAPS
250.0000 mg | ORAL_CAPSULE | Freq: Two times a day (BID) | ORAL | Status: DC
  Administered 2023-02-12 – 2023-02-15 (×7): 250 mg via ORAL
  Filled 2023-02-12 (×7): qty 1

## 2023-02-12 MED ORDER — METHOCARBAMOL 500 MG PO TABS
750.0000 mg | ORAL_TABLET | Freq: Three times a day (TID) | ORAL | Status: DC
  Administered 2023-02-12 – 2023-02-15 (×10): 750 mg via ORAL
  Filled 2023-02-12 (×11): qty 2

## 2023-02-12 MED ORDER — ACETAMINOPHEN 500 MG PO TABS
1000.0000 mg | ORAL_TABLET | Freq: Four times a day (QID) | ORAL | Status: DC
  Administered 2023-02-12 – 2023-02-15 (×11): 1000 mg via ORAL
  Filled 2023-02-12 (×11): qty 2

## 2023-02-12 MED ORDER — THIAMINE HCL 100 MG/ML IJ SOLN
500.0000 mg | Freq: Three times a day (TID) | INTRAVENOUS | Status: AC
  Administered 2023-02-12 – 2023-02-15 (×9): 500 mg via INTRAVENOUS
  Filled 2023-02-12 (×9): qty 5

## 2023-02-12 NOTE — Progress Notes (Signed)
Subjective: CC: Reports the pain she was having in the lower part of her incision has improved from Friday. Having upper abdominal pain that she feels is similar to her Crohn's flares in that she has had in the past. Tolerating po better over the weekend and finished 1/2 FLD trays yesterday. No n/v last night or today. Passing flatus. Last bm 2 days ago. Doesn't want to drink shakes as they give her diarrhea.   Reports she feels weak. Noted PT now recommending SNF.   Afebrile. No tachcyardia or hypotension. WBC 14.1 from 13.2. On abx for PNA.   Objective: Vital signs in last 24 hours: Temp:  [98.6 F (37 C)-99.1 F (37.3 C)] 99.1 F (37.3 C) (07/01 0611) Pulse Rate:  [87-94] 94 (07/01 0611) Resp:  [16] 16 (07/01 0611) BP: (139-152)/(90-94) 152/90 (07/01 0611) SpO2:  [97 %-99 %] 99 % (07/01 0611) Last BM Date : 02/09/23  Intake/Output from previous day: 06/30 0701 - 07/01 0700 In: 3723 [P.O.:300; I.V.:2645.6; IV Piggyback:777.4] Out: 3150 [Urine:3150] Intake/Output this shift: Total I/O In: -  Out: 600 [Urine:600]  PE: Gen:  Alert, NAD, pleasant Abd: Soft, mild distension, mildly tender around her incision - mainly around the superior aspect and her epigastrium. No rigidity or guarding and otherwise NT, +BS. Incision cdi and stable from picture/description on 6/27  Lab Results:  Recent Labs    02/11/23 0911 02/12/23 0418  WBC 13.2* 14.1*  HGB 11.6* 11.3*  HCT 35.3* 35.2*  PLT 676* 695*    BMET Recent Labs    02/11/23 0911 02/12/23 0418  NA 128* 129*  K 2.7* 3.8  CL 93* 99  CO2 23 20*  GLUCOSE 102* 99  BUN <5* <5*  CREATININE 0.49 0.46  CALCIUM 8.0* 8.2*    PT/INR No results for input(s): "LABPROT", "INR" in the last 72 hours. CMP     Component Value Date/Time   NA 129 (L) 02/12/2023 0418   K 3.8 02/12/2023 0418   CL 99 02/12/2023 0418   CO2 20 (L) 02/12/2023 0418   GLUCOSE 99 02/12/2023 0418   BUN <5 (L) 02/12/2023 0418   CREATININE 0.46  02/12/2023 0418   CALCIUM 8.2 (L) 02/12/2023 0418   PROT 6.2 (L) 02/09/2023 0445   ALBUMIN 2.4 (L) 02/09/2023 0445   AST 24 02/09/2023 0445   ALT 33 02/09/2023 0445   ALKPHOS 103 02/09/2023 0445   BILITOT 1.1 02/09/2023 0445   GFRNONAA >60 02/12/2023 0418   GFRAA  06/15/2010 0447    >60        The eGFR has been calculated using the MDRD equation. This calculation has not been validated in all clinical situations. eGFR's persistently <60 mL/min signify possible Chronic Kidney Disease.   Lipase     Component Value Date/Time   LIPASE 22 01/24/2023 1240    Studies/Results: No results found.  Anti-infectives: Anti-infectives (From admission, onward)    Start     Dose/Rate Route Frequency Ordered Stop   02/09/23 1200  fluconazole (DIFLUCAN) IVPB 200 mg        200 mg 100 mL/hr over 60 Minutes Intravenous Every 24 hours 02/09/23 1112     02/09/23 1200  cefTRIAXone (ROCEPHIN) 2 g in sodium chloride 0.9 % 100 mL IVPB        2 g 200 mL/hr over 30 Minutes Intravenous Every 24 hours 02/09/23 1114          Path  FINAL MICROSCOPIC DIAGNOSIS:   A.  SMALL BOWEL, ANASTOMOTIC STRICTURE, RESECTION:  Fibromuscular hypertrophy with adhesions and proximal dilatation  consistent with anastomotic stricture.  Negative for malignancy.    Assessment/Plan POD 14 s/p ex lap, loa, resection of ileocolic anastomosis with new ileocolic anastomosis for sbo 2/2 strictured ileocolic anastomosis by Dr. Magnus Ivan 01/29/23 - Path benign as above.  - CT 6/20 with no evidence of abscess or leak.  - CT 6/28 reassuring with no evidence of obstruction or abscess - D/c staples today - Tolerating FLD and having bowel function. Adv to soft diet today. RD following for supplements  - Ambulate. PT. Recommending SNF - We will follow with you  FEN - Soft diet. IVF per TRH VTE - SCDs, okay to resume anticoagulation ID - Rocephin/Fluconazole per [facility] Foley - None currently. Reports she is voiding.    - Per  [facility] -  Bilateral pleural effusions ABL anemia - Required transfusions during prior admission. Hgb stable Hx Crohn's - Per prior admission note, she has plans to start biologic therapy in outpatient setting with primary GI team (through [facility]). [facility] calling GI today given patients report that her pain feels like prior Crohn's flare.  Hx HTN Hx CVA w/ Chronic left hemiparesis  Hx PE/DVT - Dx Feb. Hx IVC filter. On Eliquis at baseline. Okay to resume anticoagulation    LOS: 3 days    [name] , [facility] 02/12/2023, 9:58 AM Please see Amion for pager number during day hours 7:00am-4:30pm

## 2023-02-12 NOTE — Evaluation (Signed)
Clinical/Bedside Swallow Evaluation Patient Details  Name: Tara Berger MRN: [id number] Date of Birth: 1971-07-20  Today's Date: 02/12/2023 Time: SLP Start Time (ACUTE ONLY): 1140 SLP Stop Time (ACUTE ONLY): 1152 SLP Time Calculation (min) (ACUTE ONLY): 12 min  Past Medical History: No past medical history on file. Past Surgical History:  Past Surgical History:  Procedure Laterality Date   LAPAROTOMY N/A 01/29/2023   Procedure: EXPLORATORY LAPAROTOMY; LYSIS OF ADHESIONS, RESECTION OF ILEOCOLIC ANASTOMOSIS, NEW ILEOCOLIC ANASTOMOSIS;  Surgeon: Abigail Miyamoto, MD;  Location: [facility] ORS;  Service: General;  Laterality: N/A;   HPI:  Patient is a 52 y.o. female with PMH: Crohn's disease, vulvar abscess, cystitis, bacterial PNA, cardioembolic CVA 2023 resulting in left sided hemiparesis, HTN, generalized anxiety disorder, iron deficiency anemia, thrombocytopenia, rheumatic mitral stenosis, tobacco use disorder, diagnosed with DVT and PE in February of this year with non-compliance with apixaban, recently admitted last month for SBO requiring NGT placement, TPN with plans to do surgery after 2 weeks of optimization, but signed AMA before general surgery completed her treatment plan. She returned to the hospital on 02/08/23 with abdominal pain and nausea/vomiting, multiple episodes of emesis in the setting of SBO. She was taking to OR for exploratory laparotomy, LOA, resection of ileocolic anastomosis with new ileocolic anastomosis for sbo 2/2 strictured ileocolic anastomosis. She was started on full liquid diet by surgery on 02/11/23 and reportedly not having any nausea/vomiting and is passing flatus. SLP swallow evaluation orderered 02/12/23.    Assessment / Plan / Recommendation  Clinical Impression  Patient presents with questionable dysphagia as per this bedside swallow evaluation. She was willing to try some liquids (water) and requested assistance sitting upright. She exhibited timely swallow  initiation. She exhibited a dry sounding cough after first sip of water, then no immediate coughing with consecutive sips, but then one delayed dry sounding cough. Patient is focused right now on wanting to sleep more and did not desire anymore PO's. Her mouth and lips are dry which may have contributed to cough. She has h/o CVA (07/2022) with interventions including SLP, OT and PT at inpatient rehab through [facility]. SLP notes from inpatient rehab reported that she had met her swallow goals and was on a regular texture solids, thin liquids diet at time of discharge. SLP to briefly follow to ensure no further overt s/s aspiration.   SLP Visit Diagnosis: Dysphagia, unspecified (R13.10)    Aspiration Risk  Mild aspiration risk    Diet Recommendation Other (Comment) (PO's as recommended by surgery)    Liquid Administration via: Cup;Straw Medication Administration: Other (Comment) (as tolerated) Supervision: Patient able to self feed Compensations: Small sips/bites;Slow rate Postural Changes: Seated upright at 90 degrees    Other  Recommendations Oral Care Recommendations: Oral care BID    Recommendations for follow up therapy are one component of a multi-disciplinary discharge planning process, led by the attending physician.  Recommendations may be updated based on patient status, additional functional criteria and insurance authorization.  Follow up Recommendations Other (comment) (TBD)      Assistance Recommended at Discharge    Functional Status Assessment Patient has had a recent decline in their functional status and demonstrates the ability to make significant improvements in function in a reasonable and predictable amount of time.  Frequency and Duration min 1 x/week  1 week       Prognosis Prognosis for improved oropharyngeal function: Good      Swallow Study   General Date of Onset: 02/12/23  HPI: Patient is a 52 y.o. female with PMH: Crohn's  disease, vulvar abscess, cystitis, bacterial PNA, cardioembolic CVA 2023 resulting in left sided hemiparesis, HTN, generalized anxiety disorder, iron deficiency anemia, thrombocytopenia, rheumatic mitral stenosis, tobacco use disorder, diagnosed with DVT and PE in February of this year with non-compliance with apixaban, recently admitted last month for SBO requiring NGT placement, TPN with plans to do surgery after 2 weeks of optimization, but signed AMA before general surgery completed her treatment plan. She returned to the hospital on 02/08/23 with abdominal pain and nausea/vomiting, multiple episodes of emesis in the setting of SBO. She was taking to OR for exploratory laparotomy, LOA, resection of ileocolic anastomosis with new ileocolic anastomosis for sbo 2/2 strictured ileocolic anastomosis. She was started on full liquid diet by surgery on 02/11/23 and reportedly not having any nausea/vomiting and is passing flatus. SLP swallow evaluation orderered 02/12/23. Type of Study: Bedside Swallow Evaluation Previous Swallow Assessment: none found Diet Prior to this Study: Full liquid diet;Thin liquids (Level 0) Temperature Spikes Noted: No (99.1) Respiratory Status: Room air History of Recent Intubation: No Behavior/Cognition: Alert;Cooperative;Pleasant mood Oral Cavity Assessment: Dry Oral Care Completed by SLP: No Oral Cavity - Dentition: Adequate natural dentition Vision: Functional for self-feeding Self-Feeding Abilities: Able to feed self Patient Positioning: Upright in bed Baseline Vocal Quality: Normal Volitional Swallow: Able to elicit    Oral/Motor/Sensory Function Overall Oral Motor/Sensory Function: Within functional limits   Ice Chips     Thin Liquid Thin Liquid: Impaired Presentation: Straw;Self Fed Pharyngeal  Phase Impairments: Other (comments) Other Comments: two instances of delayed, dry sounding cough    Nectar Thick     Honey Thick     Puree Puree: Not tested   Solid      Solid: Not tested      Angela Nevin, MA, CCC-SLP Speech Therapy

## 2023-02-12 NOTE — Evaluation (Signed)
Occupational Therapy Evaluation Patient Details Name: Tara Berger MRN: [id number] DOB: [birth year] Today's Date: 02/12/2023   History of Present Illness Patient is a 52 year old female who presented on 6/27 with worsening abdominal pain and nausea/vomiting. Patient was admitted with acute metabolic encephalopathy, oral thrush, pleural effusion, acute blood loss anemia, malnutrition. PMH: Crohn's disease, HTN, CVA L side hemiparesis, anxiety,rheumatic mitral stenosis, tobacco use disorder, DVT, PE, SBO Patient with recent SOB s/p resection of ileocolic anastomosis with new ileocolic anastomosis on 6/17.   Clinical Impression   Patient is a 52 year old female who was admitted for above. Patient reported living at home alone  PLOF. Currently, patient is mod A for bed mobility, max A for transfers with RW with increased multimodal cues and abdominal pain limiting participation in ADLs. Patient was noted to have decreased functional activity tolernace, decreased ROM, decreased BUE strength, decreased endurance, decreased sitting balance, decreased standing balanced, decreased safety awareness, and decreased knowledge of AE/AD impacting participation in ADLs. Patient would continue to benefit from skilled OT services at this time while admitted and after d/c to address noted deficits in order to improve overall safety and independence in ADLs.       Recommendations for follow up therapy are one component of a multi-disciplinary discharge planning process, led by the attending physician.  Recommendations may be updated based on patient status, additional functional criteria and insurance authorization.   Assistance Recommended at Discharge Frequent or constant Supervision/Assistance  Patient can return home with the following Two people to help with walking and/or transfers;Assistance with cooking/housework;A lot of help with bathing/dressing/bathroom;Direct supervision/assist for medications  management;Assist for transportation;Help with stairs or ramp for entrance;Direct supervision/assist for financial management    Functional Status Assessment  Patient has had a recent decline in their functional status and demonstrates the ability to make significant improvements in function in a reasonable and predictable amount of time.  Equipment Recommendations  None recommended by OT       Precautions / Restrictions Precautions Precautions: Fall Precaution Comments: recent abdominal surgery, hx L hemiparesis Restrictions Weight Bearing Restrictions: No      Mobility Bed Mobility Overal bed mobility: Needs Assistance Bed Mobility: Supine to Sit, Sit to Supine     Supine to sit: Mod assist   Sit to sidelying: Supervision General bed mobility comments: max cues for technique, pt with difficulty processing as well as performing, assist for scooting to EOB; assist for lower body onto bed and total assist to reposition in supine            Balance Overall balance assessment: Needs assistance Sitting-balance support: Feet supported, No upper extremity supported Sitting balance-Leahy Scale: Fair     Standing balance support: During functional activity, Reliant on assistive device for balance, Bilateral upper extremity supported Standing balance-Leahy Scale: Poor           ADL either performed or assessed with clinical judgement   ADL Overall ADL's : Needs assistance/impaired   Eating/Feeding Details (indicate cue type and reason): patient not drinking with increased pain and discomfort. Grooming: Min guard;Wash/dry face;Sitting Grooming Details (indicate cue type and reason): EOB Upper Body Bathing: Minimal assistance;Sitting   Lower Body Bathing: Maximal assistance;Sitting/lateral leans   Upper Body Dressing : Minimal assistance;Sitting   Lower Body Dressing: Maximal assistance;Sitting/lateral leans   Toilet Transfer: Moderate assistance;Rolling walker (2  wheels);Stand-pivot Statistician Details (indicate cue type and reason): with increased time and cues for processing transfer with patient noted to have increased difficulty  with movement of BLE. patient with small shuffle movements of feet and poor safety awareness. patient reporting increasd pain in abdomen with movement. nurse made aware. Toileting- Clothing Manipulation and Hygiene: Moderate assistance;Sitting/lateral lean                Pertinent Vitals/Pain Pain Assessment Pain Assessment: Faces Faces Pain Scale: Hurts even more Pain Location: abdomen Pain Descriptors / Indicators: Grimacing, Discomfort Pain Intervention(s): Limited activity within patient's tolerance, Monitored during session, Patient requesting pain meds-RN notified        Extremity/Trunk Assessment Upper Extremity Assessment Upper Extremity Assessment: Overall WFL for tasks assessed   Lower Extremity Assessment Lower Extremity Assessment: Defer to PT evaluation   Cervical / Trunk Assessment Cervical / Trunk Assessment: Normal   Communication Communication Communication: No difficulties   Cognition Arousal/Alertness: Awake/alert Behavior During Therapy: WFL for tasks assessed/performed, Flat affect Overall Cognitive Status: No family/caregiver present to determine baseline cognitive functioning Area of Impairment: Following commands, Safety/judgement, Problem solving       Following Commands: Follows one step commands inconsistently Safety/Judgement: Decreased awareness of deficits, Decreased awareness of safety   Problem Solving: Difficulty sequencing, Requires verbal cues, Requires tactile cues, Slow processing General Comments: Patient appeared to have a hard time processing cues and getting body to respond.                Home Living Family/patient expects to be discharged to:: Private residence Living Arrangements: Alone Available Help at Discharge: Family;Available  PRN/intermittently Type of Home: Apartment Home Access: Elevator     Home Layout: One level       Home Equipment: None   Additional Comments: information from previous admission      Prior Functioning/Environment Prior Level of Function : Independent/Modified Independent             OT Problem List: Decreased activity tolerance;Impaired balance (sitting and/or standing);Decreased coordination;Decreased safety awareness;Decreased knowledge of precautions;Decreased knowledge of use of DME or AE;Pain      OT Treatment/Interventions: Self-care/ADL training;Energy conservation;Therapeutic exercise;DME and/or AE instruction;Therapeutic activities;Patient/family education;Balance training    OT Goals(Current goals can be found in the care plan section) Acute Rehab OT Goals Patient Stated Goal: none stated OT Goal Formulation: Patient unable to participate in goal setting Time For Goal Achievement: 02/26/23 Potential to Achieve Goals: Fair  OT Frequency: Min 2X/week       AM-PAC OT "6 Clicks" Daily Activity     Outcome Measure Help from another person eating meals?: A Little Help from another person taking care of personal grooming?: A Little Help from another person toileting, which includes using toliet, bedpan, or urinal?: A Lot Help from another person bathing (including washing, rinsing, drying)?: A Lot Help from another person to put on and taking off regular upper body clothing?: A Little Help from another person to put on and taking off regular lower body clothing?: A Lot 6 Click Score: 15   End of Session Equipment Utilized During Treatment: Gait belt;Rolling walker (2 wheels) Nurse Communication: Patient requests pain meds  Activity Tolerance: Patient limited by pain Patient left: in bed;with call bell/phone within reach;with bed alarm set  OT Visit Diagnosis: Unsteadiness on feet (R26.81);Other abnormalities of gait and mobility (R26.89);Pain                 Time: 4540-9811 OT Time Calculation (min): 15 min Charges:  OT General Charges $OT Visit: 1 Visit OT Evaluation $OT Eval Low Complexity: 1 Low  Lidwina Kaner OTR/L, MS Acute Rehabilitation Department  Office# [phone]   Selinda Flavin 02/12/2023, 2:11 PM

## 2023-02-12 NOTE — Progress Notes (Signed)
Nutrition Follow-up  DOCUMENTATION CODES:   Non-severe (moderate) malnutrition in context of chronic illness  INTERVENTION:   -D/c Boost Breeze  -[employer] 1.4 PO BID, each provides 455 kcals and 20g protein  NUTRITION DIAGNOSIS:   Moderate Malnutrition related to chronic illness, altered GI function (Crohn's disease with multiple surgeries in the past) as evidenced by mild fat depletion, moderate muscle depletion.  Ongoing.  GOAL:   Patient will meet greater than or equal to 90% of their needs  Progressing.  MONITOR:   Supplement acceptance, Weight trends, I & O's, PO intake, Labs, Diet advancement    ASSESSMENT:   52 y.o. 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  She returned to the emergency department complaints of abdominal distention, abdominal pain, nausea and multiple episodes of emesis in the setting of SBO.  The patient was ultimately taken to the OR undergoing exploratory laparotomy, LOA, resection of ileocolic anastomosis with new ileocolic anastomosis for sbo 2/2 strictured ileocolic anastomosis by Dr. Magnus Ivan 01/29/23. Discharged 02/07/23, but returned to ED later d/t abdominal pain and nausea/vomiting.  Patient now on soft diet. Reports Boost Breeze was causing diarrhea. Will trial plant based protein shakes [employer]. GI started probiotic.  Admission weight: 140 lbs No other weights this admission.  Medications: Reglan, Miralax,  Florastor, KCl, Thiamine  Labs reviewed: CBGs: 89-97 Low Na  Diet Order:   Diet Order             DIET SOFT Room service appropriate? Yes; Fluid consistency: Thin  Diet effective now                   EDUCATION NEEDS:   No education needs have been identified at this time  Skin:  Skin Integrity Issues:: Incisions Incisions: 6/27 abdomen  Last BM:  6/28 -type 6  Height:   Ht Readings from Last 1 Encounters:  02/10/23 5\' 2"  (1.575 m)    Weight:   Wt Readings from Last 1 Encounters:  02/07/23 63.9 kg   BMI:  Body mass index is 25.77 kg/m.  Estimated Nutritional Needs:   Kcal:  1800-2000  Protein:  90-105g  Fluid:  2L/day   Tilda Franco, MS, RD, LDN Inpatient Clinical Dietitian Contact information available via Amion
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
## 2023-02-12 NOTE — Progress Notes (Signed)
PROGRESS NOTE    Tara Berger  WUJ:[id number] DOB: 1971-08-02 DOA: 02/08/2023 PCP: Pcp, No   Brief Narrative: 51 with past medical history significant for Crohn's disease, vulvar abscess, cystitis, bacterial pneumonia, history of cardioembolic CVA 2023, with resultant left-sided hemiparesis, hypertension, generalized anxiety disorder, iron deficiency anemia, thrombocytopenia, rheumatic mitral stenosis, tobacco use disorder, DVT and PE in February this year, no compliance with apixaban, recently admission for SBO requiring NG tube, TPN, she left AMA during that hospitalization subsequently returned and was taken to the OR for exploratory laparotomy, LOA , resection of ileocolic anastomosis with new ileocolic anastomosis for SBO secondary to stricture by Dr. Magnus Ivan.  She was discharged the day prior to this admission and she returns with worsening abdominal pain nausea and vomiting.  CT abdomen and pelvis with a stable surgical changes from recent ileocolonic anastomosis, no obstructive finding.  Scattered free fluid in the abdomen and pelvis.  No finding or intra-abdominal abscess.  Persistent bilateral pleural effusion.   Assessment & Plan:   Principal Problem:   Intractable abdominal pain Active Problems:   Essential hypertension   History of pulmonary embolism   Crohn's disease (HCC)   Malnutrition of moderate degree   Left hemiparesis (HCC)   Tobacco use disorder   Nicotine dependence, cigarettes, uncomplicated   Normocytic anemia   Thrombocytosis  1-Intractable abdominal pain, recent SBO,LOA SP resection of ileocolic anastomosis with new ileocolic anastomosis on 6/17 by Dr Magnus Ivan.  -CT abdomen pelvis 6/27; negative for abscess or obstruction.  -Report diarrhea. Unable to send stool sample no further diarrhea.  -Surgery consulted. Recommend to advanced diet as tolerated.  -PRN Zofran, IV dilaudid. Careful with oversedation.  --Continue with schedule  IV Reglan.  -Still with  abdominal pain, having nauses and vomiting, Not eating much.   2-Crohn's disease: Abdominal pain, nausea, poor oral intake persist, plan to consult GI consultation.  She report abdominal pain, pain is more in her skin surgical site. Doesn't feel like her Crohn's   3-Essential hypertension:Uncontrolled HTN Started Norvasc.  PRN Hydralazine.   Acute Metabolic Encephalopathy;  She is more confuse today. She is generalized weak.  Nurse notice patient has been confuse today.  Report headaches.  Plan to proceed with MRI, ABG, Ammonia. Thiamine, B 12.  Start high dose thiamine after level is drawn.   Hyponatremia: acute Continue with  IV fluids  NS Sodium improved with IV fluids, 120---129   Oral thrush;  Started  Nystatin and IV diflucan.   Pleural effusion: chest x ray showed BL small pleural effusion. Volume loss left Hemithorax.  Careful  with sedatives.  Incentive spirometry.  Pain medication dose reduce.    Acute Blood Loss anemia during last admission.   Normocytic anemia: Required multiples Blood transfusion last admission.  Hb remain stable. Ok to resume anticoagulation per surgery. Will start with Lovenox prophylaxis dose./  Hb stable.   Chronic Left hemiparesis: History of CVA PT consult.   Pulmonary embolism on the right and history of DVT: plan to resume Lovenox prophylaxis dose, ok to resume anticoagulation per sx.  Transition to Eliquis when tolerate diet.   Tobacco use disorder: Counseling   Malnutrition of moderate degree: Started on Supplement.    PNA; Leukocytosis; cover for PNA, report cough, chest x ray with volume loss left hemothorax, pleural effusion.  Chest x ray with  Improved but persistent linear and hazy left mid lung opacities, which may represent atelectasis or scarring Started ceftriaxone. Day 4/5  Hypokalemia; replete IV  Estimated body mass index is 25.77 kg/m as calculated from the following:   Height as of this encounter: 5'  2" (1.575 m).   Weight as of 02/07/23: 63.9 kg.   DVT prophylaxis: Lovenox Code Status: Full code Family Communication: Disposition Plan:  Status is: Observation The patient will require care spanning > 2 midnights and should be moved to inpatient because: management of abdominal pain and poor oral intake.     Consultants:  Surgery   Procedures:  none  Antimicrobials:    Subjective: She doesn't feel well.  Continue to vomit and having nausea. Report abdominal pain little improved.   Objective: Vitals:   02/10/23 2017 02/11/23 1313 02/11/23 2047 02/12/23 0611  BP: (!) 154/89 (!) 139/90 (!) 148/94 (!) 152/90  Pulse: 94 87 87 94  Resp: 18 16 16 16   Temp: 98.5 F (36.9 C) 98.7 F (37.1 C) 98.6 F (37 C) 99.1 F (37.3 C)  TempSrc: Oral Oral Oral Oral  SpO2: 100% 99% 97% 99%  Height: 5\' 2"  (1.575 m)       Intake/Output Summary (Last 24 hours) at 02/12/2023 1233 Last data filed at 02/12/2023 1124 Gross per 24 hour  Intake 3103.03 ml  Output 3450 ml  Net -346.97 ml    There were no vitals filed for this visit.  Examination:  General exam: NAD Respiratory system: CTA Cardiovascular system: S 1, S 2 RRR Gastrointestinal system: BS present, soft,   staples in place.  Central nervous system: Alert, follows command Extremities: No edema   Data Reviewed: I have personally reviewed following labs and imaging studies  CBC: Recent Labs  Lab 02/06/23 0335 02/07/23 0519 02/08/23 0559 02/09/23 0445 02/10/23 0958 02/11/23 0911 02/12/23 0418  WBC 9.4 8.7 14.5* 14.2* 13.0* 13.2* 14.1*  NEUTROABS 6.2 5.7 12.5*  --   --   --   --   HGB 8.2* 7.7* 10.9* 9.1* 10.9* 11.6* 11.3*  HCT 25.9* 24.8* 34.4* 29.2* 32.9* 35.3* 35.2*  MCV 86.3 87.0 88.2 87.2 83.3 82.9 85.2  PLT 267 281 457* 467* 576* 676* 695*    Basic Metabolic Panel: Recent Labs  Lab 02/06/23 0335 02/06/23 0335 02/08/23 0559 02/09/23 0445 02/10/23 0604 02/10/23 0958 02/10/23 2120 02/11/23 0146  02/11/23 0548 02/11/23 0911 02/12/23 0418  NA  --    < > 135 131* 120*   < > 125* 126* 126* 128* 129*  K 3.7  --  3.8 4.5 3.9  --   --   --   --  2.7* 3.8  CL  --   --  100 102 87*  --   --   --   --  93* 99  CO2  --   --  23 20* 19*  --   --   --   --  23 20*  GLUCOSE  --   --  98 105* 118*  --   --   --   --  102* 99  BUN  --   --  11 7 <5*  --   --   --   --  <5* <5*  CREATININE  --   --  0.68 0.61 0.51  --   --   --   --  0.49 0.46  CALCIUM  --   --  8.7* 7.8* 8.1*  --   --   --   --  8.0* 8.2*  MG 1.7  --   --  1.5* 1.6*  --   --   --   --   --   --  PHOS 3.4  --   --   --   --   --   --   --   --   --   --    < > = values in this interval not displayed.    GFR: Estimated Creatinine Clearance: 73 mL/min (by C-G formula based on SCr of 0.46 mg/dL). Liver Function Tests: Recent Labs  Lab 02/08/23 0559 02/09/23 0445  AST 28 24  ALT 38 33  ALKPHOS 142* 103  BILITOT 0.9 1.1  PROT 7.5 6.2*  ALBUMIN 2.9* 2.4*    No results for input(s): "LIPASE", "AMYLASE" in the last 168 hours. No results for input(s): "AMMONIA" in the last 168 hours. Coagulation Profile: No results for input(s): "INR", "PROTIME" in the last 168 hours. Cardiac Enzymes: No results for input(s): "CKTOTAL", "CKMB", "CKMBINDEX", "TROPONINI" in the last 168 hours. BNP (last 3 results) No results for input(s): "PROBNP" in the last 8760 hours. HbA1C: No results for input(s): "HGBA1C" in the last 72 hours. CBG: Recent Labs  Lab 02/11/23 2040 02/12/23 0139 02/12/23 0415 02/12/23 0719 02/12/23 1107  GLUCAP 83 91 89 97 95    Lipid Profile: No results for input(s): "CHOL", "HDL", "LDLCALC", "TRIG", "CHOLHDL", "LDLDIRECT" in the last 72 hours. Thyroid Function Tests: No results for input(s): "TSH", "T4TOTAL", "FREET4", "T3FREE", "THYROIDAB" in the last 72 hours. Anemia Panel: No results for input(s): "VITAMINB12", "FOLATE", "FERRITIN", "TIBC", "IRON", "RETICCTPCT" in the last 72 hours.  Sepsis Labs: No  results for input(s): "PROCALCITON", "LATICACIDVEN" in the last 168 hours.   No results found for this or any previous visit (from the past 240 hour(s)).        Radiology Studies: DG CHEST PORT 1 VIEW  Result Date: 02/12/2023 CLINICAL DATA:  Cough EXAM: PORTABLE CHEST 1 VIEW COMPARISON:  Chest radiograph dated 02/01/2023 FINDINGS: Well inflated lungs. Improved but persistent linear and hazy left mid lung opacities. Unchanged blunting of the left costophrenic angle. No pneumothorax. The heart size and mediastinal contours are within normal limits. No acute osseous abnormality. IMPRESSION: 1. Improved but persistent linear and hazy left mid lung opacities, which may represent atelectasis or scarring. No new focal consolidations. 2. Unchanged blunting of the left costophrenic angle, which may represent a small pleural effusion or pleural thickening. Electronically Signed   By: Agustin Cree M.D.   On: 02/12/2023 10:17        Scheduled Meds:  acetaminophen  1,000 mg Oral Q6H   amLODipine  5 mg Oral Daily   enoxaparin (LOVENOX) injection  40 mg Subcutaneous Q24H   feeding supplement (KATE FARMS STANDARD 1.4)  325 mL Oral BID BM   methocarbamol  750 mg Oral TID   metoCLOPramide (REGLAN) injection  5 mg Intravenous Q8H   metoprolol tartrate  50 mg Oral BID   nystatin  5 mL Oral QID   pantoprazole (PROTONIX) IV  40 mg Intravenous Q12H   polyethylene glycol  17 g Oral Daily   Continuous Infusions:  sodium chloride 100 mL/hr at 02/12/23 0835   cefTRIAXone (ROCEPHIN)  IV 2 g (02/12/23 1051)   fluconazole (DIFLUCAN) IV Stopped (02/11/23 1450)     LOS: 3 days    Time spent: 35 minutes    Rowen Wilmer A Saurav Crumble, MD [facility]   If 7PM-7AM, please contact night-coverage [email or web address]  02/12/2023, 12:33 PM

## 2023-02-12 NOTE — TOC Progression Note (Addendum)
Transition of Care [facility]) - Progression Note    Patient Details  Name: Tara Berger MRN: [id number] Date of Birth: September 07, 1970  [facility] CM/SW Contact  Coralyn Helling, LCSW Phone Number: 02/12/2023, 11:23 AM  Clinical Narrative:   Left message for sister, Angelena to obtain phone number for patients brother. Patient states brother can stay with her at dc to help her. TOC to follow for disposition.   12:04 PM Spoke with sister, Angelena via phone. She reports brother is not in any condition to assist patient due to stroke. Sister feels SNF would be best option for patient.   12:26 PM Patient agreeable to SNF. TOC will fax out. Sister updated on Plan.   PLAN: SNF         Expected Discharge Plan and Services                                               Social Determinants of Health (SDOH) Interventions SDOH Screenings   Food Insecurity: No Food Insecurity (02/08/2023)  Housing: Low Risk  (02/08/2023)  Transportation Needs: No Transportation Needs (02/08/2023)  Utilities: Not At Risk (02/08/2023)  Tobacco Use: High Risk (02/03/2023)    Readmission Risk Interventions    02/07/2023   11:44 AM  Readmission Risk Prevention Plan  Transportation Screening Complete  PCP or Specialist Appt within 3-5 Days Complete  HRI or Home Care Consult Complete  Social Work Consult for Recovery Care Planning/Counseling Complete  Palliative Care Screening Not Applicable  Medication Review Oceanographer) Complete

## 2023-02-12 NOTE — Consult Note (Signed)
[facility] Consultation Note  Referring Provider: [facility] Primary Care Physician:  Pcp, No Primary Gastroenterologist:  [facility]  Reason for Consultation:  abdominal pain, nausea, vomiting  HPI: Tara Berger is a 52 y.o. female history Crohn's and non-compliance.  14 weeks post surgery for fibrostenosing Crohn's disease with resection of stenosis IC anastomosis and creation new anastomosis.  Has had some nausea, and abdominal pain post-operatively.  CT few days ago unrevealing.  Occasional loose stools.     No past medical history on file.  Past Surgical History:  Procedure Laterality Date   LAPAROTOMY N/A 01/29/2023   Procedure: EXPLORATORY LAPAROTOMY; LYSIS OF ADHESIONS, RESECTION OF ILEOCOLIC ANASTOMOSIS, NEW ILEOCOLIC ANASTOMOSIS;  Surgeon: Abigail Miyamoto, MD;  Location: [facility];  Service: General;  Laterality: N/A;    Prior to Admission medications   Medication Sig Start Date End Date Taking? Authorizing Provider  methocarbamol (ROBAXIN) 750 MG tablet Take 1 tablet (750 mg total) by mouth every 8 (eight) hours as needed for muscle spasms. 02/07/23 03/09/23 Yes Leeroy Bock, MD  metoprolol tartrate (LOPRESSOR) 50 MG tablet Take 1 tablet (50 mg total) by mouth 2 (two) times daily. 02/07/23 03/09/23 Yes Leeroy Bock, MD  mirtazapine (REMERON) 15 MG tablet Take 15 mg by mouth at bedtime. 08/09/22  Yes [provider]  polyethylene glycol powder (GLYCOLAX/MIRALAX) 17 GM/SCOOP powder Mix 17 g in 4 oz of water or juice and take by mouth 2 (two) times daily. 02/07/23  Yes Leeroy Bock, MD  senna (SENOKOT) 8.6 MG TABS tablet Take 1 tablet (8.6 mg total) by mouth daily. Patient not taking: Reported on 02/09/2023 02/07/23 03/09/23  Leeroy Bock, MD    Current Facility-Administered Medications  Medication Dose Route Frequency Provider Last Rate Last Admin   0.9 %  sodium chloride infusion   Intravenous Continuous Regalado,  Belkys A, MD 100 mL/hr at 02/12/23 0835 Rate Change at 02/12/23 0835   acetaminophen (TYLENOL) tablet 1,000 mg  1,000 mg Oral Q6H Maczis, Elmer Sow, PA-C       amLODipine (NORVASC) tablet 5 mg  5 mg Oral Daily Regalado, Belkys A, MD   5 mg at 02/12/23 1044   cefTRIAXone (ROCEPHIN) 2 g in sodium chloride 0.9 % 100 mL IVPB  2 g Intravenous Daily Regalado, Belkys A, MD 200 mL/hr at 02/12/23 1051 2 g at 02/12/23 1051   docusate sodium (COLACE) capsule 100 mg  100 mg Oral BID PRN Maczis, Elmer Sow, PA-C       enoxaparin (LOVENOX) injection 40 mg  40 mg Subcutaneous Q24H Regalado, Belkys A, MD   40 mg at 02/11/23 1758   feeding supplement (KATE FARMS STANDARD 1.4) liquid 325 mL  325 mL Oral BID BM Regalado, Belkys A, MD       fluconazole (DIFLUCAN) IVPB 200 mg  200 mg Intravenous Q24H Regalado, Belkys A, MD   Stopped at 02/11/23 1450   hydrALAZINE (APRESOLINE) injection 5 mg  5 mg Intravenous Q6H PRN Regalado, Belkys A, MD       HYDROmorphone (DILAUDID) injection 0.5 mg  0.5 mg Intravenous Q4H PRN Regalado, Belkys A, MD   0.5 mg at 02/12/23 0420   methocarbamol (ROBAXIN) tablet 750 mg  750 mg Oral TID [name], PA-C   750 mg at 02/12/23 1044   metoCLOPramide (REGLAN) injection 5 mg  5 mg Intravenous Q8H Regalado, Belkys A, MD   5 mg at 02/12/23 0634   metoprolol tartrate (LOPRESSOR) tablet 50 mg  50 mg Oral BID Bobette Mo, MD   50 mg at 02/12/23 1044   nystatin (MYCOSTATIN) 100000 UNIT/ML suspension 500,000 Units  5 mL Oral QID Regalado, Belkys A, MD   500,000 Units at 02/12/23 1043   ondansetron (ZOFRAN) tablet 4 mg  4 mg Oral Q6H PRN Bobette Mo, MD   4 mg at 02/10/23 5409   Or   ondansetron (ZOFRAN) injection 4 mg  4 mg Intravenous Q6H PRN Bobette Mo, MD   4 mg at 02/11/23 1033   Oral care mouth rinse  15 mL Mouth Rinse PRN Regalado, Belkys A, MD       oxyCODONE (Oxy IR/ROXICODONE) immediate release tablet 5-10 mg  5-10 mg Oral Q4H PRN [name], PA-C   5 mg  at 02/12/23 1044   pantoprazole (PROTONIX) injection 40 mg  40 mg Intravenous Q12H Regalado, Belkys A, MD   40 mg at 02/12/23 1045   polyethylene glycol (MIRALAX / GLYCOLAX) packet 17 g  17 g Oral Daily [name], PA-C   17 g at 02/12/23 1052   thiamine (VITAMIN B1) 500 mg in sodium chloride 0.9 % 50 mL IVPB  500 mg Intravenous Q8H Regalado, Belkys A, MD        Allergies as of 02/08/2023 - Review Complete 02/08/2023  Allergen Reaction Noted   Infliximab Nausea And Vomiting, Swelling, and Other (See Comments) 01/02/2022   Latex Hives, Rash, and Other (See Comments) 06/19/2015   Wound dressing adhesive Other (See Comments) 08/09/2021   Tape Other (See Comments) 08/09/2021    No family history on file.  Social History   Socioeconomic History   Marital status: Single    Spouse name: Not on file   Number of children: Not on file   Years of education: Not on file   Highest education level: Not on file  Occupational History   Not on file  Tobacco Use   Smoking status: Every Day    Packs/day: .5    Types: Cigarettes   Smokeless tobacco: Not on file  Substance and Sexual Activity   Alcohol use: Not on file   Drug use: Not on file   Sexual activity: Not on file  Other Topics Concern   Not on file  Social History Narrative   Not on file   Social Determinants of Health   Financial Resource Strain: Not on file  Food Insecurity: No Food Insecurity (02/08/2023)   Hunger Vital Sign    Worried About Running Out of Food in the Last Year: Never true    Ran Out of Food in the Last Year: Never true  Transportation Needs: No Transportation Needs (02/08/2023)   PRAPARE - Administrator, Civil Service (Medical): No    Lack of Transportation (Non-Medical): No  Physical Activity: Not on file  Stress: Not on file  Social Connections: Not on file  Intimate Partner Violence: Not At Risk (02/08/2023)   Humiliation, Afraid, Rape, and Kick questionnaire    Fear of Current or  Ex-Partner: No    Emotionally Abused: No    Physically Abused: No    Sexually Abused: No    Review of Systems: As per HPI, all others negative  Physical Exam: Vital signs in last 24 hours: Temp:  [98.6 F (37 C)-99.1 F (37.3 C)] 98.8 F (37.1 C) (07/01 1331) Pulse Rate:  [84-94] 84 (07/01 1331) Resp:  [16] 16 (07/01 0611) BP: (148-153)/(90-103) 153/103 (07/01 1331) SpO2:  [97 %-100 %]  100 % (07/01 1331) Last BM Date : 02/09/23 General:   Alert,  Well-developed, well-nourished, pleasant and cooperative in NAD Head:  Normocephalic and atraumatic. Eyes:  Sclera clear, no icterus.   Conjunctiva pink. Ears:  Normal auditory acuity. Nose:  No deformity, discharge,  or lesions. Mouth:  No deformity or lesions.  Oropharynx pink & moist. Neck:  Supple; no masses or thyromegaly. Lungs:  Clear throughout to auscultation.   No wheezes, crackles, or rhonchi. No acute distress. Heart:  Regular rate and rhythm; no murmurs, clicks, rubs,  or gallops. Abdomen:  Soft, mild generalized tenderness without peritonitis, and nondistended. No masses, hepatosplenomegaly or hernias noted. Normal bowel sounds, without guarding, and without rebound.     Msk:  Symmetrical without gross deformities. Normal posture. Pulses:  Normal pulses noted. Extremities:  Without clubbing or edema. Neurologic:  Alert and  oriented x4;  grossly normal neurologically. Skin:  Intact without significant lesions or rashes. Psych:  Alert and cooperative. Normal mood and affect.   Lab Results: Recent Labs    02/10/23 0958 02/11/23 0911 02/12/23 0418  WBC 13.0* 13.2* 14.1*  HGB 10.9* 11.6* 11.3*  HCT 32.9* 35.3* 35.2*  PLT 576* 676* 695*   BMET Recent Labs    02/10/23 0604 02/10/23 0958 02/11/23 0548 02/11/23 0911 02/12/23 0418  NA 120*   < > 126* 128* 129*  K 3.9  --   --  2.7* 3.8  CL 87*  --   --  93* 99  CO2 19*  --   --  23 20*  GLUCOSE 118*  --   --  102* 99  BUN <5*  --   --  <5* <5*  CREATININE  0.51  --   --  0.49 0.46  CALCIUM 8.1*  --   --  8.0* 8.2*   < > = values in this interval not displayed.   LFT No results for input(s): "PROT", "ALBUMIN", "AST", "ALT", "ALKPHOS", "BILITOT", "BILIDIR", "IBILI" in the last 72 hours. PT/INR No results for input(s): "LABPROT", "INR" in the last 72 hours.  Studies/Results: DG CHEST PORT 1 VIEW  Result Date: 02/12/2023 CLINICAL DATA:  Cough EXAM: PORTABLE CHEST 1 VIEW COMPARISON:  Chest radiograph dated 02/01/2023 FINDINGS: Well inflated lungs. Improved but persistent linear and hazy left mid lung opacities. Unchanged blunting of the left costophrenic angle. No pneumothorax. The heart size and mediastinal contours are within normal limits. No acute osseous abnormality. IMPRESSION: 1. Improved but persistent linear and hazy left mid lung opacities, which may represent atelectasis or scarring. No new focal consolidations. 2. Unchanged blunting of the left costophrenic angle, which may represent a small pleural effusion or pleural thickening. Electronically Signed   By: Agustin Cree M.D.   On: 02/12/2023 10:17    Impression:   Stricturing small bowel Crohn's disease.  Few prior surgeries, including last month for fibrostenotic disease.  Prior attempts treatment with biologic agents. Two weeks post lysis of adhesions and resection prior strictured IC anastomosis and creation new anastomosis.  3.  Stroke. 4.  Abdominal pain, nausea, vomiting.  Recent CT post-operatively showed no active Crohn's and no acute process (obstruction, abscess, etc).  Plan:   Patient has benign abdomen and is not having significant diarrhea.  There is no evidence of active Crohn's on her recent CT and treatment of Crohn's disease at this point with biologic/steroid would be relatively contraindicated so soon after surgery. Add probiotics florastor 250 mg po bid. Antiemetics as needed. Diet as tolerated ok from  my perspective; defer to surgical team. Majority of her symptoms  are chronic and those that are worsening likely principally reflect her ongoing use of high-dose narcotics.  Her exam, as above, is benign, and I don't suspect any type of acute process. I would wean down narcotics.  Would not treat Crohn's disease right now, so soon after surgery. Defer other post-operative management to surgical team. Patient should follow-up with her [facility] team upon discharge. [facility] will sign-off; please call with questions; thank you for the consultation.   LOS: 3 days   Palin Tristan M  02/12/2023, 1:50 PM  Cell [phone] If no answer or after 5 PM call [phone]

## 2023-02-13 DIAGNOSIS — R109 Unspecified abdominal pain: Secondary | ICD-10-CM | POA: Diagnosis not present

## 2023-02-13 LAB — GLUCOSE, CAPILLARY
Glucose-Capillary: 114 mg/dL — ABNORMAL HIGH (ref 70–99)
Glucose-Capillary: 151 mg/dL — ABNORMAL HIGH (ref 70–99)
Glucose-Capillary: 71 mg/dL (ref 70–99)
Glucose-Capillary: 76 mg/dL (ref 70–99)
Glucose-Capillary: 86 mg/dL (ref 70–99)
Glucose-Capillary: 92 mg/dL (ref 70–99)
Glucose-Capillary: 96 mg/dL (ref 70–99)

## 2023-02-13 LAB — BASIC METABOLIC PANEL
Anion gap: 11 (ref 5–15)
BUN: <5 mg/dL — ABNORMAL LOW (ref 6–20)
CO2: 21 mmol/L — ABNORMAL LOW (ref 22–32)
Calcium: 8.6 mg/dL — ABNORMAL LOW (ref 8.9–10.3)
Chloride: 97 mmol/L — ABNORMAL LOW (ref 98–111)
Creatinine, Ser: 0.54 mg/dL (ref 0.44–1.00)
GFR, Estimated: >60 mL/min (ref >60)
Glucose, Bld: 93 mg/dL (ref 70–99)
Potassium: 3.4 mmol/L — ABNORMAL LOW (ref 3.5–5.1)
Sodium: 129 mmol/L — ABNORMAL LOW (ref 135–145)

## 2023-02-13 LAB — CBC
HCT: 37.6 % (ref 36.0–46.0)
Hemoglobin: 12.3 g/dL (ref 12.0–15.0)
MCH: 27.5 pg (ref 26.0–34.0)
MCHC: 32.7 g/dL (ref 30.0–36.0)
MCV: 84.1 fL (ref 80.0–100.0)
Platelets: 637 10*3/uL — ABNORMAL HIGH (ref 150–400)
RBC: 4.47 MIL/uL (ref 3.87–5.11)
RDW: 17.8 % — ABNORMAL HIGH (ref 11.5–15.5)
WBC: 13 10*3/uL — ABNORMAL HIGH (ref 4.0–10.5)
nRBC: 0 % (ref 0.0–0.2)

## 2023-02-13 LAB — MAGNESIUM: Magnesium: 1.5 mg/dL — ABNORMAL LOW (ref 1.7–2.4)

## 2023-02-13 MED ORDER — POTASSIUM CHLORIDE IN NACL 20-0.9 MEQ/L-% IV SOLN
INTRAVENOUS | Status: DC
  Filled 2023-02-13 (×5): qty 1000

## 2023-02-13 MED ORDER — MAGNESIUM SULFATE 2 GM/50ML IV SOLN
2.0000 g | Freq: Once | INTRAVENOUS | Status: AC
  Administered 2023-02-13: 2 g via INTRAVENOUS
  Filled 2023-02-13: qty 50

## 2023-02-13 MED ORDER — POTASSIUM CHLORIDE 10 MEQ/100ML IV SOLN
10.0000 meq | INTRAVENOUS | Status: AC
  Administered 2023-02-13 (×3): 10 meq via INTRAVENOUS
  Filled 2023-02-13: qty 100

## 2023-02-13 MED ORDER — APIXABAN 5 MG PO TABS
5.0000 mg | ORAL_TABLET | Freq: Two times a day (BID) | ORAL | Status: DC
  Administered 2023-02-13 – 2023-02-15 (×4): 5 mg via ORAL
  Filled 2023-02-13 (×4): qty 1

## 2023-02-13 MED ORDER — HYDROXYZINE HCL 10 MG PO TABS
10.0000 mg | ORAL_TABLET | Freq: Once | ORAL | Status: AC | PRN
  Administered 2023-02-13: 10 mg via ORAL
  Filled 2023-02-13: qty 1

## 2023-02-13 NOTE — Progress Notes (Signed)
Mobility Specialist - Progress Note   02/13/23 1003  Mobility  Activity Transferred from bed to chair  Level of Assistance Minimal assist, patient does 75% or more  Assistive Device Front wheel walker  Distance Ambulated (ft) 2 ft  Activity Response Tolerated well  Mobility Referral Yes  $Mobility charge 1 Mobility  Mobility Specialist Start Time (ACUTE ONLY) 0940  Mobility Specialist Stop Time (ACUTE ONLY) 1002  Mobility Specialist Time Calculation (min) (ACUTE ONLY) 22 min   Pt received in bed and agreeable to transfer to recliner. Pt was MinA from STS & MinA during transfer. Pt required several verbal cues for walker & hand placement. No complaints during session. Pt to recliner after session for breakfast. Bed alarm on.    [facility]

## 2023-02-13 NOTE — Progress Notes (Signed)
Physical Therapy Treatment Patient Details Name: Mattye Reczek MRN: [id number] DOB: [birth year] Today's Date: 02/13/2023   History of Present Illness Patient is a 52 year old female who presented on 6/27 with worsening abdominal pain and nausea/vomiting. Patient was admitted with acute metabolic encephalopathy, oral thrush, pleural effusion, acute blood loss anemia, malnutrition. PMH: Crohn's disease, HTN, CVA L side hemiparesis, anxiety,rheumatic mitral stenosis, tobacco use disorder, DVT, PE, SBO Patient with recent SOB s/p resection of ileocolic anastomosis with new ileocolic anastomosis on 6/17.    PT Comments  Patient resting in bed on arrival and reports fatigue but agreeable to work with therapy. Min guard with cues for sequencing to complete log roll for supine>sit and min assist required for sit<>stand. Pt with great difficulty initiating stepping laterally along EOB and towards chair with short shuffled festinating like steps. Min assist to weight shift and manual assist to initiate Lt stepping. Once pt in recliner dependently transported to hallway for clear forward path for gait training. Min assist to stand from recliner and pt using IV pole with Rt UE for support. Lt UE looped around therapist for UE support to to allow therapist to better provide manual facilitation of Rt/Lt weight shift for stepping. Patient ambulated ~ 175' with min assist and step length improved as distance progressed. Pt declined to amb back to room after seated rest and transported back in recliner. Pt requested return to bed and min assist provided for stand step transfer and sit>supine. Will continue to progress pt as able.    Assistance Recommended at Discharge Intermittent Supervision/Assistance  If plan is discharge home, recommend the following:  Can travel by private vehicle    A little help with walking and/or transfers;A little help with bathing/dressing/bathroom;Assist for transportation;Help with stairs  or ramp for entrance;Assistance with cooking/housework;Direct supervision/assist for medications management   Yes  Equipment Recommendations   (TBD)    Recommendations for Other Services       Precautions / Restrictions Precautions Precautions: Fall Precaution Comments: recent abdominal surgery, hx L hemiparesis Restrictions Weight Bearing Restrictions: No     Mobility  Bed Mobility Overal bed mobility: Needs Assistance Bed Mobility: Rolling, Sidelying to Sit, Sit to Supine Rolling: Supervision Sidelying to sit: Min guard   Sit to supine: Min assist        Transfers Overall transfer level: Needs assistance Equipment used: 1 person hand held assist Transfers: Sit to/from Stand, Bed to chair/wheelchair/BSC Sit to Stand: Min assist Stand pivot transfers: Min assist              Ambulation/Gait Ambulation/Gait assistance: Min assist Gait Distance (Feet): 175 Feet Assistive device: 1 person hand held assist, IV Pole Gait Pattern/deviations: Step-through pattern, Decreased stride length Gait velocity: decr         Stairs             Wheelchair Mobility     Tilt Bed    Modified Rankin (Stroke Patients Only)       Balance Overall balance assessment: Needs assistance Sitting-balance support: Feet supported, No upper extremity supported Sitting balance-Leahy Scale: Fair     Standing balance support: During functional activity, Reliant on assistive device for balance, Bilateral upper extremity supported Standing balance-Leahy Scale: Poor                              Cognition Arousal/Alertness: Awake/alert Behavior During Therapy: WFL for tasks assessed/performed, Flat affect Overall Cognitive Status: No family/caregiver  present to determine baseline cognitive functioning Area of Impairment: Following commands, Safety/judgement, Problem solving                       Following Commands: Follows one step commands  inconsistently Safety/Judgement: Decreased awareness of deficits, Decreased awareness of safety   Problem Solving: Difficulty sequencing, Requires verbal cues, Requires tactile cues, Slow processing General Comments: Patient taking extra time to process. cues to initiate. pt overall reporting fatigeud and cold.        Exercises      General Comments        Pertinent Vitals/Pain Pain Assessment Pain Assessment: Faces Faces Pain Scale: No hurt Pain Intervention(s): Limited activity within patient's tolerance, Monitored during session, Repositioned    Home Living                          Prior Function            PT Goals (current goals can now be found in the care plan section) Acute Rehab PT Goals Patient Stated Goal: to travel PT Goal Formulation: With patient Time For Goal Achievement: 02/23/23 Potential to Achieve Goals: Good Progress towards PT goals: Progressing toward goals    Frequency    Min 1X/week      PT Plan Current plan remains appropriate    Co-evaluation              AM-PAC PT "6 Clicks" Mobility   Outcome Measure  Help needed turning from your back to your side while in a flat bed without using bedrails?: A Little Help needed moving from lying on your back to sitting on the side of a flat bed without using bedrails?: A Little Help needed moving to and from a bed to a chair (including a wheelchair)?: A Little Help needed standing up from a chair using your arms (e.g., wheelchair or bedside chair)?: A Little Help needed to walk in hospital room?: A Little Help needed climbing 3-5 steps with a railing? : Total 6 Click Score: 16    End of Session Equipment Utilized During Treatment: Gait belt Activity Tolerance: Patient tolerated treatment well Patient left: in bed;with call bell/phone within reach;with bed alarm set Nurse Communication: Mobility status PT Visit Diagnosis: Other abnormalities of gait and mobility  (R26.89);Pain     Time: 2956-2130 PT Time Calculation (min) (ACUTE ONLY): 28 min  Charges:    $Gait Training: 8-22 mins $Therapeutic Activity: 8-22 mins PT General Charges $$ ACUTE PT VISIT: 1 Visit                     Wynn Maudlin, DPT Acute Rehabilitation Services Office [phone]  02/13/23 5:16 PM

## 2023-02-13 NOTE — Progress Notes (Signed)
Mobility Specialist - Progress Note   02/13/23 1049  Mobility  Activity Transferred from chair to bed  Level of Assistance Minimal assist, patient does 75% or more  Assistive Device Front wheel walker  Distance Ambulated (ft) 2 ft  Activity Response Tolerated well  Mobility Referral Yes  $Mobility charge 1 Mobility   Pt received in recliner requesting assistance back to bed with assistance of NT. Pt was MinA from STS & required MinA to transfer back to bed. Pt required verbal for hand & walker placement. No complaints during transfer. Pt to bed after session with all needs met & NT in room.  [facility]

## 2023-02-13 NOTE — Progress Notes (Addendum)
Subjective: CC: Used IV pain medication less frequently yesterday (x2 on 7/1 vs x 6 on 6/30). Reports her abdominal pain has resolved this am. She is tolerating soft diet without any nausea or vomiting. Did not drink any shakes yesterday. Passing flatus. BM yesterday. Voiding without issues. Mobilized to chair yesterday.   Afebrile. Tachycardia resolved. No hypotension. WBC downtrending from 14.1 > 13. Hgb stable at 12.3 from 11.3. On abx for PNA. MRI brain yesterday without acute findings. It did show multiple remote infarcts.   Objective: Vital signs in last 24 hours: Temp:  [98.5 F (36.9 C)-99 F (37.2 C)] 99 F (37.2 C) (07/02 0605) Pulse Rate:  [84-103] 97 (07/02 0605) Resp:  [18] 18 (07/02 0605) BP: (149-153)/(91-103) 153/97 (07/02 0605) SpO2:  [100 %] 100 % (07/02 0605) Last BM Date : 02/09/23  Intake/Output from previous day: 07/01 0701 - 07/02 0700 In: 2673.6 [P.O.:60; I.V.:2613.6] Out: 4200 [Urine:4200] Intake/Output this shift: No intake/output data recorded.  PE: Gen:  Alert, NAD, pleasant Abd: Soft, ND, mildly tender around the superior aspect of her incision. This is improved from days prior. No rigidity or guarding and otherwise NT. +BS. Incision cdi.   Lab Results:  Recent Labs    02/12/23 0418 02/13/23 0521  WBC 14.1* 13.0*  HGB 11.3* 12.3  HCT 35.2* 37.6  PLT 695* 637*    BMET Recent Labs    02/12/23 0418 02/13/23 0521  NA 129* 129*  K 3.8 3.4*  CL 99 97*  CO2 20* 21*  GLUCOSE 99 93  BUN <5* <5*  CREATININE 0.46 0.54  CALCIUM 8.2* 8.6*    PT/INR No results for input(s): "LABPROT", "INR" in the last 72 hours. CMP     Component Value Date/Time   NA 129 (L) 02/13/2023 0521   K 3.4 (L) 02/13/2023 0521   CL 97 (L) 02/13/2023 0521   CO2 21 (L) 02/13/2023 0521   GLUCOSE 93 02/13/2023 0521   BUN <5 (L) 02/13/2023 0521   CREATININE 0.54 02/13/2023 0521   CALCIUM 8.6 (L) 02/13/2023 0521   PROT 6.2 (L) 02/09/2023 0445   ALBUMIN  2.4 (L) 02/09/2023 0445   AST 24 02/09/2023 0445   ALT 33 02/09/2023 0445   ALKPHOS 103 02/09/2023 0445   BILITOT 1.1 02/09/2023 0445   GFRNONAA >60 02/13/2023 0521   GFRAA  06/15/2010 0447    >60        The eGFR has been calculated using the MDRD equation. This calculation has not been validated in all clinical situations. eGFR's persistently <60 mL/min signify possible Chronic Kidney Disease.   Lipase     Component Value Date/Time   LIPASE 22 01/24/2023 1240    Studies/Results: MR BRAIN WO CONTRAST  Result Date: 02/12/2023 CLINICAL DATA:  Altered mental status nontraumatic. EXAM: MRI HEAD WITHOUT CONTRAST TECHNIQUE: Multiplanar, multiecho pulse sequences of the brain and surrounding structures were obtained without intravenous contrast. COMPARISON:  Head CT January 31, 2023. FINDINGS: Brain: No acute infarction, hemorrhage, hydrocephalus, extra-axial collection or mass lesion. Areas of encephalomalacia and gliosis involving bilateral parietooccipital lobes. Remote infarcts are also seen the bilateral frontal lobes, right caudate, bilateral corona radiata and cerebellar hemispheres. Hemosiderin deposits are seen in the right frontal and parietal lobes. Prominent supratentorial volume loss, particularly in the parietooccipital regions. Vascular: Normal flow voids. Skull and upper cervical spine: Normal marrow signal. Sinuses/Orbits: Dehiscence of the lamina papyracea bilaterally. Right lens surgery. Paranasal sinuses are clear. Other: None. IMPRESSION: 1. No  acute intracranial abnormality. 2. Multiple remote infarcts, as described above. 3. Prominent supratentorial volume loss, particularly in the parietooccipital regions. Electronically Signed   By: Baldemar Lenis M.D.   On: 02/12/2023 16:41   DG CHEST PORT 1 VIEW  Result Date: 02/12/2023 CLINICAL DATA:  Cough EXAM: PORTABLE CHEST 1 VIEW COMPARISON:  Chest radiograph dated 02/01/2023 FINDINGS: Well inflated lungs. Improved  but persistent linear and hazy left mid lung opacities. Unchanged blunting of the left costophrenic angle. No pneumothorax. The heart size and mediastinal contours are within normal limits. No acute osseous abnormality. IMPRESSION: 1. Improved but persistent linear and hazy left mid lung opacities, which may represent atelectasis or scarring. No new focal consolidations. 2. Unchanged blunting of the left costophrenic angle, which may represent a small pleural effusion or pleural thickening. Electronically Signed   By: Agustin Cree M.D.   On: 02/12/2023 10:17    Anti-infectives: Anti-infectives (From admission, onward)    Start     Dose/Rate Route Frequency Ordered Stop   02/12/23 1023  cefTRIAXone (ROCEPHIN) 2 g in sodium chloride 0.9 % 100 mL IVPB        2 g 200 mL/hr over 30 Minutes Intravenous Daily 02/12/23 1022 02/14/23 0959   02/09/23 1200  fluconazole (DIFLUCAN) IVPB 200 mg        200 mg 100 mL/hr over 60 Minutes Intravenous Every 24 hours 02/09/23 1112     02/09/23 1200  cefTRIAXone (ROCEPHIN) 2 g in sodium chloride 0.9 % 100 mL IVPB  Status:  Discontinued        2 g 200 mL/hr over 30 Minutes Intravenous Every 24 hours 02/09/23 1114 02/12/23 1022        Path  FINAL MICROSCOPIC DIAGNOSIS:   A. SMALL BOWEL, ANASTOMOTIC STRICTURE, RESECTION:  Fibromuscular hypertrophy with adhesions and proximal dilatation  consistent with anastomotic stricture.  Negative for malignancy.    Assessment/Plan POD 15 s/p ex lap, loa, resection of ileocolic anastomosis with new ileocolic anastomosis for sbo 2/2 strictured ileocolic anastomosis by Dr. Magnus Ivan 01/29/23 - Path benign as above.  - CT 6/20 with no evidence of abscess or leak.  - CT 6/28 reassuring with no evidence of obstruction or abscess - Staples out 7/1 - Tolerating soft diet and having bowel function. Encourage PO intake and shakes. RD following.  - Ambulate. PT. Recommending SNF - Surgical stable for d/c to SNF when bed  available.  FEN - Soft diet. IVF per [facility] VTE - SCDs, okay to resume anticoagulation ID - Rocephin/Fluconazole per [facility] Foley - None currently. Reports she is voiding.    - Per [facility] -  Bilateral pleural effusions ABL anemia - Required transfusions during prior admission. Hgb stable Hx Crohn's - Per prior admission note, she has plans to start biologic therapy in outpatient setting with primary GI team (through [facility]). GI saw 7/1 and felt "Majority of her symptoms are chronic and those that are worsening likely principally reflect her ongoing use of high-dose narcotics". Added probiotics. Did not recommend tx of Crohn's right now.  Hx HTN Hx CVA w/ Chronic left hemiparesis - MRI brain 7/1 neg for acute process  Hx PE/DVT - Dx Feb. Hx IVC filter. On Eliquis at baseline. Okay to resume anticoagulation    LOS: 4 days    [name] , [facility] 02/13/2023, 8:12 AM Please see Amion for pager number during day hours 7:00am-4:30pm

## 2023-02-13 NOTE — Progress Notes (Signed)
PROGRESS NOTE    Tara Berger  ZOX:[id number] DOB: [birth year] DOA: 02/08/2023 PCP: Pcp, No   Brief Narrative: 51 with past medical history significant for Crohn's disease, vulvar abscess, cystitis, bacterial pneumonia, history of cardioembolic CVA 2023, with resultant left-sided hemiparesis, hypertension, generalized anxiety disorder, iron deficiency anemia, thrombocytopenia, rheumatic mitral stenosis, tobacco use disorder, DVT and PE in February this year, no compliance with apixaban, recently admission for SBO requiring NG tube, TPN, she left AMA during that hospitalization subsequently returned and was taken to the OR for exploratory laparotomy, LOA , resection of ileocolic anastomosis with new ileocolic anastomosis for SBO secondary to stricture by Dr. Magnus Ivan.  She was discharged the day prior to this admission and she returns with worsening abdominal pain nausea and vomiting.  CT abdomen and pelvis with a stable surgical changes from recent ileocolonic anastomosis, no obstructive finding.  Scattered free fluid in the abdomen and pelvis.  No finding or intra-abdominal abscess.  Persistent bilateral pleural effusion.   Assessment & Plan:   Principal Problem:   Intractable abdominal pain Active Problems:   Essential hypertension   History of pulmonary embolism   Crohn's disease (HCC)   Malnutrition of moderate degree   Left hemiparesis (HCC)   Tobacco use disorder   Nicotine dependence, cigarettes, uncomplicated   Normocytic anemia   Thrombocytosis  1-Intractable abdominal pain, recent SBO,LOA SP resection of ileocolic anastomosis with new ileocolic anastomosis on 6/17 by Dr Magnus Ivan.  -CT abdomen pelvis 6/27; negative for abscess or obstruction.  -Report diarrhea. Unable to send stool sample no further diarrhea.  -Surgery consulted. Recommend to advanced diet as tolerated.  -PRN Zofran, IV dilaudid. Careful with oversedation.  -Continue with schedule  IV Reglan.  -She is  feeling better today. She denies nausea, abdominal pain improved. She will try to eat today.   2-Crohn's disease: GI consulted, Dr Dulce Sellar doesn't think patient has Crohn's flare.   3-Essential hypertension: Uncontrolled HTN Started Norvasc.  PRN Hydralazine.   Acute Metabolic Encephalopathy;  She was notice to be more confuse on 7/01. She is generalized weak.  Report headaches.  MRI: showed old Strokes. ABG: Hypoxia. Placed on Oxygen. , Ammonia: 24 . Thiamine, pending.  B 12: 434. Marland Kitchen  Started  high dose Thiamine.  She seems less confuse today.   Hyponatremia: Acute:  Continue with  IV fluids  NS Sodium improved with IV fluids, 120---129   Oral thrush;  Continue  Nystatin and IV diflucan.   Pleural effusion: chest x ray showed BL small pleural effusion. Volume loss left Hemithorax.  Careful  with sedatives.  Incentive spirometry.  Pain medication dose reduce.    Acute Blood Loss anemia during last admission.   Normocytic anemia: Required multiples Blood transfusion last admission.  Hb remain stable. Ok to resume anticoagulation per surgery. Will start with Lovenox prophylaxis dose./  Hb stable.   Chronic Left hemiparesis: History of CVA PT consult.  Needs rehab,.   Pulmonary embolism on the right and history of DVT: plan to resume Lovenox prophylaxis dose, ok to resume anticoagulation per sx.  Will resume Eliquis.   Tobacco use disorder: Counseling   Malnutrition of moderate degree: Started on Supplement.    PNA; Leukocytosis; cover for PNA, report cough, chest x ray with volume loss left hemothorax, pleural effusion.  Chest x ray with  Improved but persistent linear and hazy left mid lung opacities, which may represent atelectasis or scarring Started ceftriaxone. Day 5/5  Hypokalemia; Replete with IV fluids KCL. And KCL runs.  Hypomagnesemia; replete IV mag.    Estimated body mass index is 25.77 kg/m as calculated from the following:   Height as of this  encounter: 5\' 2"  (1.575 m).   Weight as of 02/07/23: 63.9 kg.   DVT prophylaxis: Lovenox Code Status: Full code Family Communication: Disposition Plan:  Status is: Observation The patient will require care spanning > 2 midnights and should be moved to inpatient because: management of abdominal pain and poor oral intake.     Consultants:  Surgery   Procedures:  none  Antimicrobials:    Subjective: She is less confuse today. She is alert, report feeling better. No vomiting since yesterday.  She report abdominal pain has improved.  She will try to eat today.   Objective: Vitals:   02/12/23 0611 02/12/23 1331 02/12/23 2046 02/13/23 0605  BP: (!) 152/90 (!) 153/103 (!) 149/91 (!) 153/97  Pulse: 94 84 (!) 103 97  Resp: 16  18 18   Temp: 99.1 F (37.3 C) 98.8 F (37.1 C) 98.5 F (36.9 C) 99 F (37.2 C)  TempSrc: Oral Oral Oral Oral  SpO2: 99% 100% 100% 100%  Height:        Intake/Output Summary (Last 24 hours) at 02/13/2023 0730 Last data filed at 02/13/2023 1610 Gross per 24 hour  Intake 2673.57 ml  Output 3600 ml  Net -926.43 ml    There were no vitals filed for this visit.  Examination:  General exam: NAD Respiratory system: CTA Cardiovascular system: S1, S 2 RRR Gastrointestinal system: BS present, soft, incision healing.  Central nervous system:alert, follows command Extremities: No edema   Data Reviewed: I have personally reviewed following labs and imaging studies  CBC: Recent Labs  Lab 02/07/23 0519 02/08/23 0559 02/09/23 0445 02/10/23 0958 02/11/23 0911 02/12/23 0418 02/13/23 0521  WBC 8.7 14.5* 14.2* 13.0* 13.2* 14.1* 13.0*  NEUTROABS 5.7 12.5*  --   --   --   --   --   HGB 7.7* 10.9* 9.1* 10.9* 11.6* 11.3* 12.3  HCT 24.8* 34.4* 29.2* 32.9* 35.3* 35.2* 37.6  MCV 87.0 88.2 87.2 83.3 82.9 85.2 84.1  PLT 281 457* 467* 576* 676* 695* 637*    Basic Metabolic Panel: Recent Labs  Lab 02/09/23 0445 02/10/23 0604 02/10/23 0958 02/11/23 0146  02/11/23 0548 02/11/23 0911 02/12/23 0418 02/13/23 0521  NA 131* 120*   < > 126* 126* 128* 129* 129*  K 4.5 3.9  --   --   --  2.7* 3.8 3.4*  CL 102 87*  --   --   --  93* 99 97*  CO2 20* 19*  --   --   --  23 20* 21*  GLUCOSE 105* 118*  --   --   --  102* 99 93  BUN 7 <5*  --   --   --  <5* <5* <5*  CREATININE 0.61 0.51  --   --   --  0.49 0.46 0.54  CALCIUM 7.8* 8.1*  --   --   --  8.0* 8.2* 8.6*  MG 1.5* 1.6*  --   --   --   --   --  1.5*   < > = values in this interval not displayed.    GFR: Estimated Creatinine Clearance: 73 mL/min (by C-G formula based on SCr of 0.54 mg/dL). Liver Function Tests: Recent Labs  Lab 02/08/23 0559 02/09/23 0445  AST 28 24  ALT 38 33  ALKPHOS 142* 103  BILITOT 0.9 1.1  PROT 7.5 6.2*  ALBUMIN 2.9* 2.4*    No results for input(s): "LIPASE", "AMYLASE" in the last 168 hours. Recent Labs  Lab 02/12/23 1221  AMMONIA 24   Coagulation Profile: No results for input(s): "INR", "PROTIME" in the last 168 hours. Cardiac Enzymes: No results for input(s): "CKTOTAL", "CKMB", "CKMBINDEX", "TROPONINI" in the last 168 hours. BNP (last 3 results) No results for input(s): "PROBNP" in the last 8760 hours. HbA1C: No results for input(s): "HGBA1C" in the last 72 hours. CBG: Recent Labs  Lab 02/12/23 1107 02/12/23 1620 02/12/23 2044 02/13/23 0037 02/13/23 0434  GLUCAP 95 70 92 76 96    Lipid Profile: No results for input(s): "CHOL", "HDL", "LDLCALC", "TRIG", "CHOLHDL", "LDLDIRECT" in the last 72 hours. Thyroid Function Tests: No results for input(s): "TSH", "T4TOTAL", "FREET4", "T3FREE", "THYROIDAB" in the last 72 hours. Anemia Panel: Recent Labs    02/12/23 0910  VITAMINB12 434    Sepsis Labs: No results for input(s): "PROCALCITON", "LATICACIDVEN" in the last 168 hours.   No results found for this or any previous visit (from the past 240 hour(s)).        Radiology Studies: MR BRAIN WO CONTRAST  Result Date:  02/12/2023 CLINICAL DATA:  Altered mental status nontraumatic. EXAM: MRI HEAD WITHOUT CONTRAST TECHNIQUE: Multiplanar, multiecho pulse sequences of the brain and surrounding structures were obtained without intravenous contrast. COMPARISON:  Head CT January 31, 2023. FINDINGS: Brain: No acute infarction, hemorrhage, hydrocephalus, extra-axial collection or mass lesion. Areas of encephalomalacia and gliosis involving bilateral parietooccipital lobes. Remote infarcts are also seen the bilateral frontal lobes, right caudate, bilateral corona radiata and cerebellar hemispheres. Hemosiderin deposits are seen in the right frontal and parietal lobes. Prominent supratentorial volume loss, particularly in the parietooccipital regions. Vascular: Normal flow voids. Skull and upper cervical spine: Normal marrow signal. Sinuses/Orbits: Dehiscence of the lamina papyracea bilaterally. Right lens surgery. Paranasal sinuses are clear. Other: None. IMPRESSION: 1. No acute intracranial abnormality. 2. Multiple remote infarcts, as described above. 3. Prominent supratentorial volume loss, particularly in the parietooccipital regions. Electronically Signed   By: Baldemar Lenis M.D.   On: 02/12/2023 16:41   DG CHEST PORT 1 VIEW  Result Date: 02/12/2023 CLINICAL DATA:  Cough EXAM: PORTABLE CHEST 1 VIEW COMPARISON:  Chest radiograph dated 02/01/2023 FINDINGS: Well inflated lungs. Improved but persistent linear and hazy left mid lung opacities. Unchanged blunting of the left costophrenic angle. No pneumothorax. The heart size and mediastinal contours are within normal limits. No acute osseous abnormality. IMPRESSION: 1. Improved but persistent linear and hazy left mid lung opacities, which may represent atelectasis or scarring. No new focal consolidations. 2. Unchanged blunting of the left costophrenic angle, which may represent a small pleural effusion or pleural thickening. Electronically Signed   By: Agustin Cree M.D.   On:  02/12/2023 10:17        Scheduled Meds:  acetaminophen  1,000 mg Oral Q6H   amLODipine  5 mg Oral Daily   enoxaparin (LOVENOX) injection  40 mg Subcutaneous Q24H   feeding supplement (KATE FARMS STANDARD 1.4)  325 mL Oral BID BM   methocarbamol  750 mg Oral TID   metoCLOPramide (REGLAN) injection  5 mg Intravenous Q8H   metoprolol tartrate  50 mg Oral BID   nystatin  5 mL Oral QID   pantoprazole (PROTONIX) IV  40 mg Intravenous Q12H   polyethylene glycol  17 g Oral Daily   saccharomyces boulardii  250 mg Oral BID   Continuous Infusions:  0.9 % NaCl with KCl 20 mEq / L     cefTRIAXone (ROCEPHIN)  IV 2 g (02/12/23 1051)   fluconazole (DIFLUCAN) IV 200 mg (02/12/23 1200)   magnesium sulfate bolus IVPB     potassium chloride     thiamine (VITAMIN B1) injection 500 mg (02/13/23 0536)     LOS: 4 days    Time spent: 35 minutes    Kaulder Zahner A Esbeidy Mclaine, MD [facility]   If 7PM-7AM, please contact night-coverage [email or web address]  02/13/2023, 7:30 AM

## 2023-02-13 NOTE — Progress Notes (Signed)
SLP Cancellation Note  Patient Details Name: Tara Berger MRN: [id number] DOB: 09-Apr-1971   Cancelled treatment:       Reason Eval/Treat Not Completed: Other (comment) (patient declined to consume po intake with this SLP stating she had just consumed her lunch with  her sister; will continue efforts)   Chales Abrahams 02/13/2023, 12:39 PM  Rolena Infante, MS [facility] SLP Acute Rehab Services Office [phone]

## 2023-02-14 LAB — GLUCOSE, CAPILLARY
Glucose-Capillary: 104 mg/dL — ABNORMAL HIGH (ref 70–99)
Glucose-Capillary: 114 mg/dL — ABNORMAL HIGH (ref 70–99)
Glucose-Capillary: 118 mg/dL — ABNORMAL HIGH (ref 70–99)
Glucose-Capillary: 77 mg/dL (ref 70–99)
Glucose-Capillary: 81 mg/dL (ref 70–99)

## 2023-02-14 LAB — BASIC METABOLIC PANEL
Anion gap: 12 (ref 5–15)
BUN: <5 mg/dL — ABNORMAL LOW (ref 6–20)
CO2: 20 mmol/L — ABNORMAL LOW (ref 22–32)
Calcium: 8.6 mg/dL — ABNORMAL LOW (ref 8.9–10.3)
Chloride: 98 mmol/L (ref 98–111)
Creatinine, Ser: 0.44 mg/dL (ref 0.44–1.00)
GFR, Estimated: >60 mL/min (ref >60)
Glucose, Bld: 104 mg/dL — ABNORMAL HIGH (ref 70–99)
Potassium: 3.8 mmol/L (ref 3.5–5.1)
Sodium: 130 mmol/L — ABNORMAL LOW (ref 135–145)

## 2023-02-14 LAB — CBC
HCT: 39.8 % (ref 36.0–46.0)
Hemoglobin: 11.6 g/dL — ABNORMAL LOW (ref 12.0–15.0)
MCH: 27.6 pg (ref 26.0–34.0)
MCHC: 29.1 g/dL — ABNORMAL LOW (ref 30.0–36.0)
MCV: 94.5 fL (ref 80.0–100.0)
Platelets: 608 10*3/uL — ABNORMAL HIGH (ref 150–400)
RBC: 4.21 MIL/uL (ref 3.87–5.11)
RDW: 18.6 % — ABNORMAL HIGH (ref 11.5–15.5)
WBC: 12 10*3/uL — ABNORMAL HIGH (ref 4.0–10.5)
nRBC: 0 % (ref 0.0–0.2)

## 2023-02-14 LAB — MAGNESIUM: Magnesium: 1.7 mg/dL (ref 1.7–2.4)

## 2023-02-14 MED ORDER — PANTOPRAZOLE SODIUM 40 MG PO TBEC
40.0000 mg | DELAYED_RELEASE_TABLET | Freq: Two times a day (BID) | ORAL | Status: DC
  Administered 2023-02-14 – 2023-02-15 (×2): 40 mg via ORAL
  Filled 2023-02-14 (×2): qty 1

## 2023-02-14 NOTE — TOC Progression Note (Signed)
Transition of Care [facility]) - Progression Note    Patient Details  Name: Jakaila Rubsam MRN: [id number] Date of Birth: 06-Jul-1971  Transition of Care [facility]) CM/SW Contact  [name], LCSW Phone Number: 02/14/2023, 11:43 AM  Clinical Narrative:    CSW met with pt at bedside to discuss SNF recommendation and bed offers. Pt in agreement and agreeable to either facility at DC.  CSW contacted Whitney at [place] intake and verified that her team will seek Auth for [facility]. Whitney estimates auth typically takes 24-48 hours. CSW secure chat back to MD sharing the expected Auth turnaround as MD inquired.  TOC to continue to follow.      Barriers to Discharge: Equipment Delay, Continued Medical Work up  Expected Discharge Plan and Services                                               Social Determinants of Health (SDOH) Interventions SDOH Screenings   Food Insecurity: No Food Insecurity (02/08/2023)  Housing: Low Risk  (02/08/2023)  Transportation Needs: No Transportation Needs (02/08/2023)  Utilities: Not At Risk (02/08/2023)  Tobacco Use: High Risk (02/03/2023)    Readmission Risk Interventions    02/07/2023   11:44 AM  Readmission Risk Prevention Plan  Transportation Screening Complete  PCP or Specialist Appt within 3-5 Days Complete  HRI or Home Care Consult Complete  Social Work Consult for Recovery Care Planning/Counseling Complete  Palliative Care Screening Not Applicable  Medication Review Oceanographer) Complete

## 2023-02-14 NOTE — Progress Notes (Signed)
Occupational Therapy Treatment Patient Details Name: Tara Berger MRN: [id number] DOB: 30-Aug-1970 Today's Date: 02/14/2023   History of present illness Patient is a 52 year old female who presented on 6/27 with worsening abdominal pain and nausea/vomiting. Patient was admitted with acute metabolic encephalopathy, oral thrush, pleural effusion, acute blood loss anemia, malnutrition. PMH: Crohn's disease, HTN, CVA L side hemiparesis, anxiety,rheumatic mitral stenosis, tobacco use disorder, DVT, PE, SBO Patient with recent SOB s/p resection of ileocolic anastomosis with new ileocolic anastomosis on 6/17.   OT comments  Patient was noted to make progress with bed mobility, grooming tasks and sit to stands during session. Patient continues to have decreased functional activity tolerance, decreased endurance, decreased standing balance, decreased safety awareness, and decreased knowledge of AD/AE impacting participation in ADLs.  Patient would continue to benefit from skilled OT services at this time while admitted and after d/c to address noted deficits in order to improve overall safety and independence in ADLs.     Recommendations for follow up therapy are one component of a multi-disciplinary discharge planning process, led by the attending physician.  Recommendations may be updated based on patient status, additional functional criteria and insurance authorization.    Assistance Recommended at Discharge Frequent or constant Supervision/Assistance  Patient can return home with the following  Two people to help with walking and/or transfers;Assistance with cooking/housework;A lot of help with bathing/dressing/bathroom;Direct supervision/assist for medications management;Assist for transportation;Help with stairs or ramp for entrance;Direct supervision/assist for financial management   Equipment Recommendations  None recommended by OT       Precautions / Restrictions Precautions Precautions:  Fall Precaution Comments: recent abdominal surgery, hx L hemiparesis Restrictions Weight Bearing Restrictions: No       Mobility Bed Mobility Overal bed mobility: Needs Assistance Bed Mobility: Rolling, Sidelying to Sit, Sit to Supine Rolling: Supervision Sidelying to sit: Min guard     Sit to sidelying: Supervision            ADL either performed or assessed with clinical judgement   ADL Overall ADL's : Needs assistance/impaired     Grooming: Sitting;Wash/dry face;Oral care;Supervision/safety Grooming Details (indicate cue type and reason): EOB with increased time                               General ADL Comments: patient was able to advance BLE with physical cues to weigth shift and min A to advance feet up side of bed with increased time and RW. patient noted to push RW away and decline to get to recliner. patient was educated on importance of time out of bed and using RW for balance. patient verbalized understanding but declined to get out of bed.      Cognition Arousal/Alertness: Awake/alert Behavior During Therapy: WFL for tasks assessed/performed, Flat affect Overall Cognitive Status: No family/caregiver present to determine baseline cognitive functioning       General Comments: patient reporting increased fatigue but plesant and cooperative        Exercises Other Exercises Other Exercises: patient participated in sit to stands x7 wtih RW with education on proper hand placement and min A to soften landing onto bed with patient verbalized understanding with continued need for cues during session.            Pertinent Vitals/ Pain       Pain Assessment Pain Assessment: Faces Faces Pain Scale: No hurt         Frequency  Min 2X/week           Plan Discharge plan remains appropriate       AM-PAC OT "6 Clicks" Daily Activity     Outcome Measure   Help from another person eating meals?: A Little Help from another person taking  care of personal grooming?: A Little Help from another person toileting, which includes using toliet, bedpan, or urinal?: A Lot Help from another person bathing (including washing, rinsing, drying)?: A Lot Help from another person to put on and taking off regular upper body clothing?: A Little Help from another person to put on and taking off regular lower body clothing?: A Lot 6 Click Score: 15    End of Session Equipment Utilized During Treatment: Gait belt;Rolling walker (2 wheels)  OT Visit Diagnosis: Unsteadiness on feet (R26.81);Other abnormalities of gait and mobility (R26.89);Pain   Activity Tolerance Patient limited by fatigue;Patient limited by lethargy   Patient Left in bed;with call bell/phone within reach;with bed alarm set   Nurse Communication Other (comment) (ok to participate in session)        Time: 1400-1419 OT Time Calculation (min): 19 min  Charges: OT General Charges $OT Visit: 1 Visit OT Treatments $Self Care/Home Management : 8-22 mins  Rosalio Loud, MS Acute Rehabilitation Department Office# [phone]   Selinda Flavin 02/14/2023, 2:28 PM

## 2023-02-14 NOTE — Progress Notes (Signed)
Mobility Specialist - Progress Note   02/14/23 1015  Mobility  Activity Stood at bedside (bed Level LE Exercises)  Level of Assistance Minimal assist, patient does 75% or more  Assistive Device Front wheel walker  Range of Motion/Exercises Left leg;Right leg;Active;Active Assistive  Activity Response Tolerated well  Mobility Referral Yes  $Mobility charge 1 Mobility  Mobility Specialist Start Time (ACUTE ONLY) [id number]  Mobility Specialist Stop Time (ACUTE ONLY) 1014  Mobility Specialist Time Calculation (min) (ACUTE ONLY) 33 min   Pt received in bed and agreeable to do bed level LE exercises & STS. Pt required active assistance from a few of the exercises. See below for exercises.   Pt was MinA from STS & tolerated 5x STS. No complaints during session. Pt to bed after session with all needs met.    Supine BLE exercises: 5 reps each  1) Ankle Pumps  2) Heel Slides   3) Hip Abduction /Adduction   4) Straight Leg Raise     [facility] Mobility Specialist

## 2023-02-14 NOTE — Progress Notes (Signed)
PROGRESS NOTE  Tara Berger ZOX:[id number] DOB: 03/06/1971 DOA: 02/08/2023 PCP: Pcp, No   LOS: 5 days   Brief Narrative / Interim history: 64 with past medical history significant for Crohn's disease, vulvar abscess, cystitis, bacterial pneumonia, history of cardioembolic CVA 2023, with resultant left-sided hemiparesis, hypertension, generalized anxiety disorder, iron deficiency anemia, thrombocytopenia, rheumatic mitral stenosis, tobacco use disorder, DVT and PE in February this year, no compliance with apixaban, recently admission for SBO requiring NG tube, TPN, she left AMA during that hospitalization subsequently returned and was taken to the OR for exploratory laparotomy, LOA , resection of ileocolic anastomosis with new ileocolic anastomosis for SBO secondary to stricture by Dr. Magnus Ivan.  She was discharged the day prior to this admission and she returns with worsening abdominal pain nausea and vomiting. CT abdomen and pelvis with a stable surgical changes from recent ileocolonic anastomosis, no obstructive finding.  Scattered free fluid in the abdomen and pelvis.  No finding or intra-abdominal abscess.  Persistent bilateral pleural effusion  Subjective / 24h Interval events: Feels better, abdominal pain improved  Assesement and Plan: Principal Problem:   Intractable abdominal pain Active Problems:   Essential hypertension   History of pulmonary embolism   Crohn's disease (HCC)   Malnutrition of moderate degree   Left hemiparesis (HCC)   Tobacco use disorder   Nicotine dependence, cigarettes, uncomplicated   Normocytic anemia   Thrombocytosis   Principal problem Intractable abdominal pain, recent SBO,LOA SP resection of ileocolic anastomosis with new ileocolic anastomosis on 6/17 by Dr Magnus Ivan - CT abdomen pelvis 6/27; negative for abscess or obstruction. Report diarrhea. Unable to send stool sample no further diarrhea.  -Surgery consulted. Recommend to advanced diet as  tolerated.  -Feeling better today.  Encouraged p.o. intake   Active problems Crohn's disease - GI consulted, Dr Dulce Sellar doesn't think patient has Crohn's flare.    Essential hypertension - Started Norvasc.    Acute Metabolic Encephalopathy - She was notice to be more confuse on 7/01.   Hyponatremia - Continue with  IV fluids  NS  Oral thrush - Continue  Nystatin and IV diflucan.    Acute Blood Loss anemia during last admission - Normocytic anemia, required multiples blood transfusion last admission.  Hb remain stable. Ok to resume anticoagulation per surgery.    Chronic Left hemiparesis, History of CVA - PT consult. Needs rehab   Pulmonary embolism on the right and history of DVT -Continue Eliquis   Tobacco use disorder - Counseling    Malnutrition of moderate degree - Started on Supplement.    PNA, Leukocytosis -completed 5 days of antibiotics    Hypokalemia -replace and continue to monitor   Hypomagnesemia - replete   Scheduled Meds:  acetaminophen  1,000 mg Oral Q6H   amLODipine  5 mg Oral Daily   apixaban  5 mg Oral BID   feeding supplement (KATE FARMS STANDARD 1.4)  325 mL Oral BID BM   methocarbamol  750 mg Oral TID   metoCLOPramide (REGLAN) injection  5 mg Intravenous Q8H   metoprolol tartrate  50 mg Oral BID   nystatin  5 mL Oral QID   pantoprazole (PROTONIX) IV  40 mg Intravenous Q12H   polyethylene glycol  17 g Oral Daily   saccharomyces boulardii  250 mg Oral BID   Continuous Infusions:  0.9 % NaCl with KCl 20 mEq / L 100 mL/hr at 02/13/23 1758   fluconazole (DIFLUCAN) IV Stopped (02/13/23 1226)   thiamine (VITAMIN B1) injection 500 mg (  02/14/23 0518)   PRN Meds:.docusate sodium, hydrALAZINE, HYDROmorphone (DILAUDID) injection, ondansetron **OR** ondansetron (ZOFRAN) IV, mouth rinse, oxyCODONE  Current Outpatient Medications  Medication Instructions   methocarbamol (ROBAXIN) 750 mg, Oral, Every 8 hours PRN   metoprolol tartrate (LOPRESSOR) 50 mg, Oral, 2  times daily   mirtazapine (REMERON) 15 mg, Oral, Daily at bedtime   polyethylene glycol powder (GLYCOLAX/MIRALAX) 17 GM/SCOOP powder Mix 17 g in 4 oz of water or juice and take by mouth 2 (two) times daily.   senna (SENOKOT) 8.6 mg, Oral, Daily    Diet Orders (From admission, onward)     Start     Ordered   02/12/23 1004  DIET SOFT Room service appropriate? Yes; Fluid consistency: Thin  Diet effective now       Question Answer Comment  Room service appropriate? Yes   Fluid consistency: Thin      02/12/23 1004            DVT prophylaxis: SCDs Start: 02/08/23 1501 apixaban (ELIQUIS) tablet 5 mg   Lab Results  Component Value Date   PLT 608 (H) 02/14/2023      Code Status: Full Code  Family Communication: no family at bedside   Status is: Inpatient  Remains inpatient appropriate because: awaiting SNF  Level of care: Med-Surg  Consultants:  General surgery   Objective: Vitals:   02/13/23 0605 02/13/23 1148 02/13/23 2032 02/14/23 0447  BP: (!) 153/97 (!) 155/97 (!) 187/89 (!) 144/80  Pulse: 97 96 (!) 110 79  Resp: 18 16 18 18   Temp: 99 F (37.2 C) 98.5 F (36.9 C) 97.9 F (36.6 C) 98.5 F (36.9 C)  TempSrc: Oral Oral Oral Oral  SpO2: 100% 100% 100% 100%  Height:        Intake/Output Summary (Last 24 hours) at 02/14/2023 1125 Last data filed at 02/14/2023 1000 Gross per 24 hour  Intake 4417.84 ml  Output 4700 ml  Net -282.16 ml   Wt Readings from Last 3 Encounters:  02/07/23 63.9 kg    Examination:  Constitutional: NAD Eyes: no scleral icterus ENMT: Mucous membranes are moist.  Neck: normal, supple Respiratory: clear to auscultation bilaterally, no wheezing, no crackles. Normal respiratory effort. No accessory muscle use.  Cardiovascular: Regular rate and rhythm, no murmurs / rubs / gallops. No LE edema.  Abdomen: non distended, no tenderness. Bowel sounds positive.  Musculoskeletal: no clubbing / cyanosis.    Data Reviewed: I have independently  reviewed following labs and imaging studies   CBC Recent Labs  Lab 02/08/23 0559 02/09/23 0445 02/10/23 0958 02/11/23 0911 02/12/23 0418 02/13/23 0521 02/14/23 0505  WBC 14.5*   < > 13.0* 13.2* 14.1* 13.0* 12.0*  HGB 10.9*   < > 10.9* 11.6* 11.3* 12.3 11.6*  HCT 34.4*   < > 32.9* 35.3* 35.2* 37.6 39.8  PLT 457*   < > 576* 676* 695* 637* 608*  MCV 88.2   < > 83.3 82.9 85.2 84.1 94.5  MCH 27.9   < > 27.6 27.2 27.4 27.5 27.6  MCHC 31.7   < > 33.1 32.9 32.1 32.7 29.1*  RDW 19.4*   < > 17.7* 17.5* 17.8* 17.8* 18.6*  LYMPHSABS 1.0  --   --   --   --   --   --   MONOABS 0.6  --   --   --   --   --   --   EOSABS 0.1  --   --   --   --   --   --  BASOSABS 0.1  --   --   --   --   --   --    < > = values in this interval not displayed.    Recent Labs  Lab 02/08/23 0559 02/09/23 0445 02/10/23 0604 02/10/23 1610 02/11/23 0548 02/11/23 0911 02/12/23 0418 02/12/23 1221 02/13/23 0521 02/14/23 0505  NA 135 131* 120*   < > 126* 128* 129*  --  129* 130*  K 3.8 4.5 3.9  --   --  2.7* 3.8  --  3.4* 3.8  CL 100 102 87*  --   --  93* 99  --  97* 98  CO2 23 20* 19*  --   --  23 20*  --  21* 20*  GLUCOSE 98 105* 118*  --   --  102* 99  --  93 104*  BUN 11 7 <5*  --   --  <5* <5*  --  <5* <5*  CREATININE 0.68 0.61 0.51  --   --  0.49 0.46  --  0.54 0.44  CALCIUM 8.7* 7.8* 8.1*  --   --  8.0* 8.2*  --  8.6* 8.6*  AST 28 24  --   --   --   --   --   --   --   --   ALT 38 33  --   --   --   --   --   --   --   --   ALKPHOS 142* 103  --   --   --   --   --   --   --   --   BILITOT 0.9 1.1  --   --   --   --   --   --   --   --   ALBUMIN 2.9* 2.4*  --   --   --   --   --   --   --   --   MG  --  1.5* 1.6*  --   --   --   --   --  1.5* 1.7  AMMONIA  --   --   --   --   --   --   --  24  --   --    < > = values in this interval not displayed.    ------------------------------------------------------------------------------------------------------------------ No results for input(s):  "CHOL", "HDL", "LDLCALC", "TRIG", "CHOLHDL", "LDLDIRECT" in the last 72 hours.  No results found for: "HGBA1C" ------------------------------------------------------------------------------------------------------------------ No results for input(s): "TSH", "T4TOTAL", "T3FREE", "THYROIDAB" in the last 72 hours.  Invalid input(s): "FREET3"  Cardiac Enzymes No results for input(s): "CKMB", "TROPONINI", "MYOGLOBIN" in the last 168 hours.  Invalid input(s): "CK" ------------------------------------------------------------------------------------------------------------------ No results found for: "BNP"  CBG: Recent Labs  Lab 02/13/23 1542 02/13/23 2009 02/13/23 2357 02/14/23 0440 02/14/23 0727  GLUCAP 92 71 114* 104* 114*    No results found for this or any previous visit (from the past 240 hour(s)).   Radiology Studies: No results found.   Pamella Pert, MD, PhD [facility]  Between 7 am - 7 pm I am available, please contact me via Amion (for emergencies) or Securechat (non urgent messages)  Between 7 pm - 7 am I am not available, please contact night coverage MD/APP via Amion

## 2023-02-15 LAB — GLUCOSE, CAPILLARY
Glucose-Capillary: 132 mg/dL — ABNORMAL HIGH (ref 70–99)
Glucose-Capillary: 57 mg/dL — ABNORMAL LOW (ref 70–99)
Glucose-Capillary: 69 mg/dL — ABNORMAL LOW (ref 70–99)
Glucose-Capillary: 75 mg/dL (ref 70–99)
Glucose-Capillary: 84 mg/dL (ref 70–99)
Glucose-Capillary: 94 mg/dL (ref 70–99)

## 2023-02-15 MED ORDER — APIXABAN 5 MG PO TABS: 5.0000 mg | ORAL_TABLET | Freq: Two times a day (BID) | ORAL | Status: AC

## 2023-02-15 MED ORDER — NYSTATIN 100000 UNIT/ML MT SUSP: 5.0000 mL | Freq: Four times a day (QID) | OROMUCOSAL | 0 refills | Status: AC

## 2023-02-15 MED ORDER — OXYCODONE HCL 5 MG PO TABS: 5.0000 mg | ORAL_TABLET | Freq: Four times a day (QID) | ORAL | 0 refills | Status: AC | PRN

## 2023-02-15 NOTE — TOC Transition Note (Signed)
Transition of Care [facility]) - CM/SW Discharge Note   Patient Details  Name: Tara Berger MRN: [id number] Date of Birth: 11/19/1970  Transition of Care (TOC) CM/SW Contact:  [name], LCSW Phone Number: 02/15/2023, 1:20 PM   Clinical Narrative:    Pt Insurance auth today.  CSW contacted by [facility], DS sent RR# given to RN.  CSW met with pt at bedside, she was pleased.  Called sister and shared that DC is today and location. Medical necessity completed and DC packet prepared.  CSW called [facility] to arrange Transport.No further TOC needs.     Barriers to Discharge: No Barriers Identified   Patient Goals and CMS Choice      Discharge Placement                         Discharge Plan and Services Additional resources added to the After Visit Summary for                                       Social Determinants of Health (SDOH) Interventions SDOH Screenings   Food Insecurity: No Food Insecurity (02/08/2023)  Housing: Low Risk  (02/08/2023)  Transportation Needs: No Transportation Needs (02/08/2023)  Utilities: Not At Risk (02/08/2023)  Tobacco Use: High Risk (02/03/2023)     Readmission Risk Interventions    02/07/2023   11:44 AM  Readmission Risk Prevention Plan  Transportation Screening Complete  PCP or Specialist Appt within 3-5 Days Complete  HRI or Home Care Consult Complete  Social Work Consult for Recovery Care Planning/Counseling Complete  Palliative Care Screening Not Applicable  Medication Review Oceanographer) Complete

## 2023-02-15 NOTE — Progress Notes (Signed)
Physical Therapy Treatment Patient Details Name: Tara Berger MRN: [id number] DOB: May 06, 1971 Today's Date: 02/15/2023   History of Present Illness Patient is a 52 year old female who presented on 6/27 with worsening abdominal pain and nausea/vomiting. Patient was admitted with acute metabolic encephalopathy, oral thrush, pleural effusion, acute blood loss anemia, malnutrition. PMH: Crohn's disease, HTN, CVA L side hemiparesis, anxiety,rheumatic mitral stenosis, tobacco use disorder, DVT, PE, SBO Patient with recent SOB s/p resection of ileocolic anastomosis with new ileocolic anastomosis on 6/17.    PT Comments  Pt agreeable to participate with PT on arrival to room but then requesting to use BSC.  Pt assisted to from [facility] with CNA assisting with hygiene but only tolerated side-stepping up side of bed following 2* c/o 10/10 pain.  Will follow.     Assistance Recommended at Discharge Intermittent Supervision/Assistance  If plan is discharge home, recommend the following:  Can travel by private vehicle    A little help with walking and/or transfers;A little help with bathing/dressing/bathroom;Assist for transportation;Help with stairs or ramp for entrance;Assistance with cooking/housework;Direct supervision/assist for medications management   Yes  Equipment Recommendations  Rolling walker (2 wheels)    Recommendations for Other Services       Precautions / Restrictions Precautions Precautions: Fall Precaution Comments: recent abdominal surgery, hx L hemiparesis Restrictions Weight Bearing Restrictions: No     Mobility  Bed Mobility Overal bed mobility: Needs Assistance Bed Mobility: Rolling, Sidelying to Sit, Sit to Supine Rolling: Supervision Sidelying to sit: Min assist     Sit to sidelying: Min assist, Mod assist General bed mobility comments: max cues for technique, pt with difficulty processing as well as performing, assist for scooting to EOB; assist for lower body onto  bed and to reposition in supine    Transfers Overall transfer level: Needs assistance Equipment used: 1 person hand held assist Transfers: Sit to/from Stand, Bed to chair/wheelchair/BSC Sit to Stand: Min assist   Step pivot transfers: Min assist, Mod assist       General transfer comment: multimodal cues for technique, assist to rise and steady as well as control descent; pt with crossed LEs upon pivot to [facility], pt with small shuffling in place upon return to bed    Ambulation/Gait Ambulation/Gait assistance: Min assist Gait Distance (Feet): 3 Feet Assistive device: Rolling walker (2 wheels) Gait Pattern/deviations: Step-to pattern, Shuffle Gait velocity: decr     General Gait Details: Pt tolerated side-step up EOB with RW only 2* pain level   Stairs             Wheelchair Mobility     Tilt Bed    Modified Rankin (Stroke Patients Only)       Balance Overall balance assessment: Needs assistance Sitting-balance support: Feet supported, No upper extremity supported Sitting balance-Leahy Scale: Fair     Standing balance support: During functional activity, Reliant on assistive device for balance, Bilateral upper extremity supported Standing balance-Leahy Scale: Poor                              Cognition Arousal/Alertness: Awake/alert Behavior During Therapy: WFL for tasks assessed/performed, Flat affect Overall Cognitive Status: No family/caregiver present to determine baseline cognitive functioning Area of Impairment: Following commands, Safety/judgement, Problem solving                       Following Commands: Follows one step commands inconsistently Safety/Judgement: Decreased awareness of deficits,  Decreased awareness of safety   Problem Solving: Difficulty sequencing, Requires verbal cues, Requires tactile cues, Slow processing General Comments: patient reporting increased fatigue/pain but pleasant and cooperative         Exercises      General Comments        Pertinent Vitals/Pain Pain Assessment Pain Assessment: 0-10 Pain Score: 10-Worst pain ever Pain Location: abdomen Pain Descriptors / Indicators: Grimacing, Guarding, Discomfort Pain Intervention(s): Limited activity within patient's tolerance, Monitored during session    Home Living                          Prior Function            PT Goals (current goals can now be found in the care plan section) Acute Rehab PT Goals Patient Stated Goal: to travel PT Goal Formulation: With patient Time For Goal Achievement: 02/23/23 Potential to Achieve Goals: Good Progress towards PT goals: Not progressing toward goals - comment (pain limited this date)    Frequency    Min 1X/week      PT Plan Current plan remains appropriate    Co-evaluation              AM-PAC PT "6 Clicks" Mobility   Outcome Measure  Help needed turning from your back to your side while in a flat bed without using bedrails?: A Little Help needed moving from lying on your back to sitting on the side of a flat bed without using bedrails?: A Little Help needed moving to and from a bed to a chair (including a wheelchair)?: A Lot Help needed standing up from a chair using your arms (e.g., wheelchair or bedside chair)?: A Little Help needed to walk in hospital room?: Total Help needed climbing 3-5 steps with a railing? : Total 6 Click Score: 13    End of Session Equipment Utilized During Treatment: Gait belt Activity Tolerance: Patient limited by pain;Patient limited by fatigue Patient left: in bed;with call bell/phone within reach;with bed alarm set Nurse Communication: Mobility status PT Visit Diagnosis: Other abnormalities of gait and mobility (R26.89);Pain     Time: 4098-1191 PT Time Calculation (min) (ACUTE ONLY): 21 min  Charges:    $Therapeutic Activity: 8-22 mins PT General Charges $$ ACUTE PT VISIT: 1 Visit                      Mauro Kaufmann PT Acute Rehabilitation Services Pager [phone] Office [phone]    Tara Berger 02/15/2023, 1:04 PM

## 2023-02-15 NOTE — Progress Notes (Deleted)
PROGRESS NOTE  Tara Berger ZOX:[id number] DOB: 12-Nov-1970 DOA: 02/08/2023 PCP: Pcp, No   LOS: 6 days   Brief Narrative / Interim history: 72 with past medical history significant for Crohn's disease, vulvar abscess, cystitis, bacterial pneumonia, history of cardioembolic CVA 2023, with resultant left-sided hemiparesis, hypertension, generalized anxiety disorder, iron deficiency anemia, thrombocytopenia, rheumatic mitral stenosis, tobacco use disorder, DVT and PE in February this year, no compliance with apixaban, recently admission for SBO requiring NG tube, TPN, she left AMA during that hospitalization subsequently returned and was taken to the OR for exploratory laparotomy, LOA , resection of ileocolic anastomosis with new ileocolic anastomosis for SBO secondary to stricture by Dr. Magnus Ivan.  She was discharged the day prior to this admission and she returns with worsening abdominal pain nausea and vomiting. CT abdomen and pelvis with a stable surgical changes from recent ileocolonic anastomosis, no obstructive finding.  Scattered free fluid in the abdomen and pelvis.  No finding or intra-abdominal abscess.  Persistent bilateral pleural effusion  Subjective / 24h Interval events: No significant complaints this morning  Assesement and Plan: Principal Problem:   Intractable abdominal pain Active Problems:   Essential hypertension   History of pulmonary embolism   Crohn's disease (HCC)   Malnutrition of moderate degree   Left hemiparesis (HCC)   Tobacco use disorder   Nicotine dependence, cigarettes, uncomplicated   Normocytic anemia   Thrombocytosis   Principal problem Intractable abdominal pain, recent SBO,LOA SP resection of ileocolic anastomosis with new ileocolic anastomosis on 6/17 by Dr Magnus Ivan - CT abdomen pelvis 6/27; negative for abscess or obstruction. Report diarrhea. Unable to send stool sample no further diarrhea.  -Surgery consulted. Recommend to advanced diet as  tolerated.  -Feeling better today.  Encouraged p.o. intake -Awaiting SNF   Active problems Crohn's disease - GI consulted, Dr Dulce Sellar doesn't think patient has Crohn's flare.    Essential hypertension - Started Norvasc.    Acute Metabolic Encephalopathy - She was notice to be more confuse on 7/01.   Hyponatremia - Continue with  IV fluids  NS  Oral thrush - Continue  Nystatin and IV diflucan.    Acute Blood Loss anemia during last admission - Normocytic anemia, required multiples blood transfusion last admission.  Hb remain stable. Ok to resume anticoagulation per surgery.    Chronic Left hemiparesis, History of CVA - PT consult. Needs rehab   Pulmonary embolism on the right and history of DVT -Continue Eliquis   Tobacco use disorder - Counseling    Malnutrition of moderate degree - Started on Supplement.    PNA, Leukocytosis -completed 5 days of antibiotics    Hypokalemia -replace and continue to monitor   Hypomagnesemia - replete   Scheduled Meds:  acetaminophen  1,000 mg Oral Q6H   amLODipine  5 mg Oral Daily   apixaban  5 mg Oral BID   feeding supplement (KATE FARMS STANDARD 1.4)  325 mL Oral BID BM   methocarbamol  750 mg Oral TID   metoCLOPramide (REGLAN) injection  5 mg Intravenous Q8H   metoprolol tartrate  50 mg Oral BID   nystatin  5 mL Oral QID   pantoprazole  40 mg Oral BID   polyethylene glycol  17 g Oral Daily   saccharomyces boulardii  250 mg Oral BID   Continuous Infusions:  0.9 % NaCl with KCl 20 mEq / L 100 mL/hr at 02/14/23 1832   fluconazole (DIFLUCAN) IV 200 mg (02/14/23 1202)   PRN Meds:.docusate sodium, hydrALAZINE, HYDROmorphone (  DILAUDID) injection, ondansetron **OR** ondansetron (ZOFRAN) IV, mouth rinse, oxyCODONE  Current Outpatient Medications  Medication Instructions   methocarbamol (ROBAXIN) 750 mg, Oral, Every 8 hours PRN   metoprolol tartrate (LOPRESSOR) 50 mg, Oral, 2 times daily   mirtazapine (REMERON) 15 mg, Oral, Daily at  bedtime   polyethylene glycol powder (GLYCOLAX/MIRALAX) 17 GM/SCOOP powder Mix 17 g in 4 oz of water or juice and take by mouth 2 (two) times daily.   senna (SENOKOT) 8.6 mg, Oral, Daily    Diet Orders (From admission, onward)     Start     Ordered   02/12/23 1004  DIET SOFT Room service appropriate? Yes; Fluid consistency: Thin  Diet effective now       Question Answer Comment  Room service appropriate? Yes   Fluid consistency: Thin      02/12/23 1004            DVT prophylaxis: SCDs Start: 02/08/23 1501 apixaban (ELIQUIS) tablet 5 mg   Lab Results  Component Value Date   PLT 608 (H) 02/14/2023      Code Status: Full Code  Family Communication: no family at bedside   Status is: Inpatient  Remains inpatient appropriate because: awaiting SNF  Level of care: Med-Surg  Consultants:  General surgery   Objective: Vitals:   02/14/23 0447 02/14/23 1131 02/14/23 2024 02/15/23 0453  BP: (!) 144/80 (!) 148/93 (!) 150/84 (!) 164/97  Pulse: 79 77 91 71  Resp: 18 16 16 18   Temp: 98.5 F (36.9 C) 98.7 F (37.1 C) 99.8 F (37.7 C) 99.1 F (37.3 C)  TempSrc: Oral Oral Oral Oral  SpO2: 100% 100% 100% 94%  Height:        Intake/Output Summary (Last 24 hours) at 02/15/2023 1125 Last data filed at 02/15/2023 1124 Gross per 24 hour  Intake 2754.23 ml  Output 4300 ml  Net -1545.77 ml    Wt Readings from Last 3 Encounters:  02/07/23 63.9 kg    Examination:  Constitutional: NAD Respiratory: CTA Cardiovascular: Regular rate and rhythm   Data Reviewed: I have independently reviewed following labs and imaging studies   CBC Recent Labs  Lab 02/10/23 0958 02/11/23 0911 02/12/23 0418 02/13/23 0521 02/14/23 0505  WBC 13.0* 13.2* 14.1* 13.0* 12.0*  HGB 10.9* 11.6* 11.3* 12.3 11.6*  HCT 32.9* 35.3* 35.2* 37.6 39.8  PLT 576* 676* 695* 637* 608*  MCV 83.3 82.9 85.2 84.1 94.5  MCH 27.6 27.2 27.4 27.5 27.6  MCHC 33.1 32.9 32.1 32.7 29.1*  RDW 17.7* 17.5* 17.8*  17.8* 18.6*     Recent Labs  Lab 02/09/23 0445 02/10/23 0604 02/10/23 0958 02/11/23 0548 02/11/23 0911 02/12/23 0418 02/12/23 1221 02/13/23 0521 02/14/23 0505  NA 131* 120*   < > 126* 128* 129*  --  129* 130*  K 4.5 3.9  --   --  2.7* 3.8  --  3.4* 3.8  CL 102 87*  --   --  93* 99  --  97* 98  CO2 20* 19*  --   --  23 20*  --  21* 20*  GLUCOSE 105* 118*  --   --  102* 99  --  93 104*  BUN 7 <5*  --   --  <5* <5*  --  <5* <5*  CREATININE 0.61 0.51  --   --  0.49 0.46  --  0.54 0.44  CALCIUM 7.8* 8.1*  --   --  8.0* 8.2*  --  8.6*  8.6*  AST 24  --   --   --   --   --   --   --   --   ALT 33  --   --   --   --   --   --   --   --   ALKPHOS 103  --   --   --   --   --   --   --   --   BILITOT 1.1  --   --   --   --   --   --   --   --   ALBUMIN 2.4*  --   --   --   --   --   --   --   --   MG 1.5* 1.6*  --   --   --   --   --  1.5* 1.7  AMMONIA  --   --   --   --   --   --  24  --   --    < > = values in this interval not displayed.     ------------------------------------------------------------------------------------------------------------------ No results for input(s): "CHOL", "HDL", "LDLCALC", "TRIG", "CHOLHDL", "LDLDIRECT" in the last 72 hours.  No results found for: "HGBA1C" ------------------------------------------------------------------------------------------------------------------ No results for input(s): "TSH", "T4TOTAL", "T3FREE", "THYROIDAB" in the last 72 hours.  Invalid input(s): "FREET3"  Cardiac Enzymes No results for input(s): "CKMB", "TROPONINI", "MYOGLOBIN" in the last 168 hours.  Invalid input(s): "CK" ------------------------------------------------------------------------------------------------------------------ No results found for: "BNP"  CBG: Recent Labs  Lab 02/15/23 0345 02/15/23 0723 02/15/23 0801 02/15/23 0831 02/15/23 1121  GLUCAP 75 69* 57* 94 132*     No results found for this or any previous visit (from the past 240  hour(s)).   Radiology Studies: No results found.   Pamella Pert, MD, PhD [facility]  Between 7 am - 7 pm I am available, please contact me via Amion (for emergencies) or Securechat (non urgent messages)  Between 7 pm - 7 am I am not available, please contact night coverage MD/APP via Amion

## 2023-02-15 NOTE — Discharge Summary (Signed)
Physician Discharge Summary  Tara Berger NFA:[id number] DOB: 03-03-1971 DOA: 02/08/2023  PCP: Oneita Hurt, No  Admit date: 02/08/2023 Discharge date: 02/15/2023  Admitted From: home Disposition:  SNF  Recommendations for Outpatient Follow-up:  Follow up with PCP in 1-2 weeks  Home Health: none Equipment/Devices: none  Discharge Condition: stable CODE STATUS: Full code  HPI: Per admitting MD, Tara Berger is a 52 y.o. 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  She returned to the emergency department complaints of abdominal distention, abdominal pain, nausea and multiple episodes of emesis in the setting of SBO.  The patient was ultimately taken to the OR undergoing exploratory laparotomy, LOA, resection of ileocolic anastomosis with new ileocolic anastomosis for sbo 2/2 strictured ileocolic anastomosis by Dr. Magnus Ivan 01/29/23.  She was discharged yesterday, but returns today due to abdominal pain and nausea/vomiting. No diarrhea, constipation, melena or hematochezia.  No flank pain, dysuria, frequency or hematuria. No fever, chills or night sweats. No sore throat, rhinorrhea, dyspnea, wheezing or hemoptysis.  No chest pain, palpitations, diaphoresis, PND, orthopnea or pitting edema of the lower extremities.  No polyuria, polydipsia, polyphagia or blurred vision.   Hospital Course / Discharge diagnoses: Principal Problem:   Intractable abdominal pain Active  Problems:   Essential hypertension   History of pulmonary embolism   Crohn's disease (HCC)   Malnutrition of moderate degree   Left hemiparesis (HCC)   Tobacco use disorder   Nicotine dependence, cigarettes, uncomplicated   Normocytic anemia   Thrombocytosis   Principal problem Intractable abdominal pain, recent SBO,LOA SP resection of ileocolic anastomosis with new ileocolic anastomosis on 6/17 by Dr Magnus Ivan - CT abdomen pelvis 6/27; negative for abscess or obstruction. Surgery consulted, no further interventions planned. Her pain improved with conservative management, her diet advanced and she is now tolerating a regular diet.    Active problems Crohn's disease - GI consulted, Dr Dulce Sellar doesn't think patient has Crohn's flare.  Essential hypertension - continue metoprolol Acute Metabolic Encephalopathy - resolved  Hyponatremia - improved with fluids Oral thrush - Continue Nystatin  Acute Blood Loss anemia during last admission - Normocytic anemia, required multiples blood transfusion last admission.  Hb remain stable. Ok to resume anticoagulation per surgery. Hemoglobin stable Chronic Left hemiparesis, History of CVA - PT consult. Needs rehab Pulmonary embolism on the right and history of DVT -Continue Eliquis Tobacco use disorder - Counseling  Malnutrition of moderate degree - Started on Supplement.  PNA, Leukocytosis -completed 5 days of antibiotics  Hypokalemia -replaced Hypomagnesemia - repleted   Sepsis ruled out   Discharge Instructions   Allergies as of 02/15/2023       Reactions   Infliximab Nausea And Vomiting, Swelling, Other (See Comments)   Remicade- chills   Latex Hives, Rash, Other (See Comments)   Blisters also   Wound Dressing Adhesive Other (See Comments)   Blisters   Tape Other (See Comments)        Medication List     TAKE these medications    apixaban 5 MG Tabs tablet Commonly known as: ELIQUIS Take 1 tablet (  5 mg total) by mouth 2 (two)  times daily.   methocarbamol 750 MG tablet Commonly known as: ROBAXIN Take 1 tablet (750 mg total) by mouth every 8 (eight) hours as needed for muscle spasms.   metoprolol tartrate 50 MG tablet Commonly known as: LOPRESSOR Take 1 tablet (50 mg total) by mouth 2 (two) times daily.   mirtazapine 15 MG tablet Commonly known as: REMERON Take 15 mg by mouth at bedtime.   nystatin 100000 UNIT/ML suspension Commonly known as: MYCOSTATIN Take 5 mLs (500,000 Units total) by mouth 4 (four) times daily for 5 days.   oxyCODONE 5 MG immediate release tablet Commonly known as: Oxy IR/ROXICODONE Take 1 tablet (5 mg) by mouth every 6 hours as needed for severe or moderate pain (1 tablet for moderate pain, 2 tablets for severe pain).   polyethylene glycol powder 17 GM/SCOOP powder Commonly known as: GLYCOLAX/MIRALAX Mix 17 g in 4 oz of water or juice and take by mouth 2 (two) times daily.   senna 8.6 MG Tabs tablet Commonly known as: SENOKOT Take 1 tablet (8.6 mg total) by mouth daily.        Follow-up Information     [facility] Follow up.   Why: [facility] will provide PT in the home after discharge.                Consultations: General surgery   Procedures/Studies:  MR BRAIN WO CONTRAST  Result Date: 02/12/2023 CLINICAL DATA:  Altered mental status nontraumatic. EXAM: MRI HEAD WITHOUT CONTRAST TECHNIQUE: Multiplanar, multiecho pulse sequences of the brain and surrounding structures were obtained without intravenous contrast. COMPARISON:  Head CT January 31, 2023. FINDINGS: Brain: No acute infarction, hemorrhage, hydrocephalus, extra-axial collection or mass lesion. Areas of encephalomalacia and gliosis involving bilateral parietooccipital lobes. Remote infarcts are also seen the bilateral frontal lobes, right caudate, bilateral corona radiata and cerebellar hemispheres. Hemosiderin deposits are seen in the right frontal and parietal lobes. Prominent  supratentorial volume loss, particularly in the parietooccipital regions. Vascular: Normal flow voids. Skull and upper cervical spine: Normal marrow signal. Sinuses/Orbits: Dehiscence of the lamina papyracea bilaterally. Right lens surgery. Paranasal sinuses are clear. Other: None. IMPRESSION: 1. No acute intracranial abnormality. 2. Multiple remote infarcts, as described above. 3. Prominent supratentorial volume loss, particularly in the parietooccipital regions. Electronically Signed   By: Baldemar Lenis M.D.   On: 02/12/2023 16:41   DG CHEST PORT 1 VIEW  Result Date: 02/12/2023 CLINICAL DATA:  Cough EXAM: PORTABLE CHEST 1 VIEW COMPARISON:  Chest radiograph dated 02/01/2023 FINDINGS: Well inflated lungs. Improved but persistent linear and hazy left mid lung opacities. Unchanged blunting of the left costophrenic angle. No pneumothorax. The heart size and mediastinal contours are within normal limits. No acute osseous abnormality. IMPRESSION: 1. Improved but persistent linear and hazy left mid lung opacities, which may represent atelectasis or scarring. No new focal consolidations. 2. Unchanged blunting of the left costophrenic angle, which may represent a small pleural effusion or pleural thickening. Electronically Signed   By: Agustin Cree M.D.   On: 02/12/2023 10:17   DG Chest 2 View  Result Date: 02/09/2023 CLINICAL DATA:  Pleural effusion EXAM: CHEST - 2 VIEW COMPARISON:  CT 02/01/2023 and x-ray FINDINGS: Small pleural effusions, left-greater-than-right. Additional deformity along the left midthorax with presumed lateral pleural thickening. Volume loss of the left hemithorax. No edema or pneumothorax. Stable cardiopericardial silhouette. Air-fluid level along the stomach. IVC filter seen at the edge of the imaging field  on the lateral view. Previous PICC no longer seen IMPRESSION: Previous PICC no longer seen. Otherwise no significant interval change when adjusting for technique  Electronically Signed   By: Karen Kays M.D.   On: 02/09/2023 10:44   CT ABDOMEN PELVIS W CONTRAST  Result Date: 02/08/2023 CLINICAL DATA:  History of Crohn's disease with recent exploratory laparotomy and resection ileocolic anastomosis and reanastomosis. Patient is having abdominal pain. EXAM: CT ABDOMEN AND PELVIS WITH CONTRAST TECHNIQUE: Multidetector CT imaging of the abdomen and pelvis was performed using the standard protocol following bolus administration of intravenous contrast. RADIATION DOSE REDUCTION: This exam was performed according to the departmental dose-optimization program which includes automated exposure control, adjustment of the mA and/or kV according to patient size and/or use of iterative reconstruction technique. CONTRAST:  OMNIPAQUE IOHEXOL 300 MG/ML  SOLN COMPARISON:  CT scan 02/01/2023 FINDINGS: Lower chest: Persistent bilateral pleural effusions and overlying atelectasis. The heart is normal in size. No pericardial effusion. Hepatobiliary: No hepatic lesions or intrahepatic biliary dilatation. The gallbladder is unremarkable. No common bile duct dilatation. Pancreas: No mass, inflammation or ductal dilatation. Spleen: Normal size.  No focal lesions. Adrenals/Urinary Tract: The adrenal glands and kidneys are unremarkable. The bladder is normal. Stomach/Bowel: The stomach, duodenum and small bowel are unremarkable. No obstructive findings. Stable surgical changes from recent ileal colonic anastomosis. No free air. Scattered free fluid in the abdomen and pelvis. Vascular/Lymphatic: The aorta and branch vessels are normal. IVC filter is stable. No abdominal or pelvic lymphadenopathy. Reproductive: The uterus and ovaries are unremarkable. Other: Small amount of free pelvic fluid. Musculoskeletal: No significant findings. IMPRESSION: 1. Stable surgical changes from recent ileal colonic anastomosis. No obstructive findings. 2. Scattered free fluid in the abdomen and pelvis. No  findings for intra-abdominal abscess. 3. Persistent bilateral pleural effusions and overlying atelectasis. Electronically Signed   By: Rudie Meyer M.D.   On: 02/08/2023 12:56   DG Abdomen 1 View  Result Date: 02/08/2023 CLINICAL DATA:  52 year old female with history of abdominal pain and vomiting. Crohn's disease. EXAM: ABDOMEN - 1 VIEW COMPARISON:  Abdominal radiograph 01/29/2023. FINDINGS: No pathologic dilatation of small bowel or colon. Gas and stool are noted in the colon and rectum. No definite pneumoperitoneum noted on this single supine image. Surgical staples are noted in the lower abdomen and pelvis near the midline. IVC filter in position with tip projecting over the superior endplate of L2. IMPRESSION: 1. Nonobstructive bowel gas pattern. 2. No pneumoperitoneum. 3. Postoperative changes and support apparatus, as above. Electronically Signed   By: Trudie Reed M.D.   On: 02/08/2023 06:38   CT Angio Chest Pulmonary Embolism (PE) W or WO Contrast  Result Date: 02/01/2023 CLINICAL DATA:  Recent surgery for small bowel obstruction. Postoperative abdominal pain. EXAM: CT ANGIOGRAPHY CHEST CT ABDOMEN AND PELVIS WITH CONTRAST TECHNIQUE: Multidetector CT imaging of the chest was performed using the standard protocol during bolus administration of intravenous contrast. Multiplanar CT image reconstructions and MIPs were obtained to evaluate the vascular anatomy. Multidetector CT imaging of the abdomen and pelvis was performed using the standard protocol during bolus administration of intravenous contrast. RADIATION DOSE REDUCTION: This exam was performed according to the departmental dose-optimization program which includes automated exposure control, adjustment of the mA and/or kV according to patient size and/or use of iterative reconstruction technique. CONTRAST:  OMNIPAQUE IOHEXOL 300 MG/ML  SOLN COMPARISON:  CT abdomen/pelvis 01/25/2023 FINDINGS: CTA CHEST FINDINGS Cardiovascular: The  heart is normal in size. No pericardial effusion. The  aorta is normal in caliber. No dissection. Scattered atherosclerotic calcifications at the aortic arch. Age advanced three-vessel coronary artery calcifications are noted. The pulmonary arterial tree is well opacified. No filling defects to suggest pulmonary embolism. Mild enlargement of the pulmonary arteries may suggest pulmonary hypertension. Mediastinum/Nodes: No mediastinal or hilar mass or lymphadenopathy. The esophagus is grossly. The left subclavian central venous catheter is in good position. Lungs/Pleura: Small bilateral pleural effusions, right larger than left with areas of overlying subsegmental atelectasis. Underlying emphysematous changes and pulmonary scarring. Centrilobular upper lobe nodularity greater on the right likely suggesting respiratory bronchiolitis. Musculoskeletal: No significant bony findings. Review of the MIP images confirms the above findings. CT ABDOMEN and PELVIS FINDINGS Hepatobiliary: Mild periportal edema and a small amount fluid around the gallbladder likely a postsurgical change. No hepatic lesions or intrahepatic biliary dilatation. The portal and hepatic veins are patent. Pancreas: Normal Spleen: Normal Adrenals/Urinary Tract: Normal Stomach/Bowel: The stomach and duodenum are unremarkable. The proximal and mid small bowel loops are normal in caliber. Slight dilatation of the more distal small bowel loops but no findings for recurrent obstruction. Contrast extends all the way into the colon. Surgical changes at the ileocolonic anastomosis. Expected inflammations/edema and scattered surrounding fluid but no findings suspicious for a postoperative abscess. No leaking oral contrast is identified. One small dot of air near the anastomosis is not unexpected. Vascular/Lymphatic: The aorta and branch vessels are patent. The major venous structures are patent. Stable IVC filter. No mesenteric or retroperitoneal mass or  adenopathy. Reproductive: The uterus and ovaries are unremarkable. Other: Small amount of free abdominal and free pelvic fluid, not unexpected after surgery. No hematoma or rim enhancing abscess. Musculoskeletal: No significant bony findings. Review of the MIP images confirms the above findings. IMPRESSION: 1. No CT findings for pulmonary embolism. 2. Small bilateral pleural effusions, right larger than left with areas of overlying subsegmental atelectasis. 3. Underlying emphysematous changes and pulmonary scarring. 4. Centrilobular upper lobe nodularity greater on the right likely suggesting respiratory bronchiolitis. 5. Surgical changes at the ileocolonic anastomosis. Expected inflammation/edema and scattered surrounding fluid but no findings suspicious for a postoperative abscess. No leaking oral contrast is identified. 6. Small amount of free abdominal and free pelvic fluid, not unexpected after surgery. 7. Aortic atherosclerosis. Aortic Atherosclerosis (ICD10-I70.0) and Emphysema (ICD10-J43.9). But Electronically Signed   By: Rudie Meyer M.D.   On: 02/01/2023 12:09   CT ABDOMEN PELVIS W CONTRAST  Result Date: 02/01/2023 CLINICAL DATA:  Recent surgery for small bowel obstruction. Postoperative abdominal pain. EXAM: CT ANGIOGRAPHY CHEST CT ABDOMEN AND PELVIS WITH CONTRAST TECHNIQUE: Multidetector CT imaging of the chest was performed using the standard protocol during bolus administration of intravenous contrast. Multiplanar CT image reconstructions and MIPs were obtained to evaluate the vascular anatomy. Multidetector CT imaging of the abdomen and pelvis was performed using the standard protocol during bolus administration of intravenous contrast. RADIATION DOSE REDUCTION: This exam was performed according to the departmental dose-optimization program which includes automated exposure control, adjustment of the mA and/or kV according to patient size and/or use of iterative reconstruction technique.  CONTRAST:  OMNIPAQUE IOHEXOL 300 MG/ML  SOLN COMPARISON:  CT abdomen/pelvis 01/25/2023 FINDINGS: CTA CHEST FINDINGS Cardiovascular: The heart is normal in size. No pericardial effusion. The aorta is normal in caliber. No dissection. Scattered atherosclerotic calcifications at the aortic arch. Age advanced three-vessel coronary artery calcifications are noted. The pulmonary arterial tree is well opacified. No filling defects to suggest pulmonary embolism. Mild enlargement of the pulmonary arteries  may suggest pulmonary hypertension. Mediastinum/Nodes: No mediastinal or hilar mass or lymphadenopathy. The esophagus is grossly. The left subclavian central venous catheter is in good position. Lungs/Pleura: Small bilateral pleural effusions, right larger than left with areas of overlying subsegmental atelectasis. Underlying emphysematous changes and pulmonary scarring. Centrilobular upper lobe nodularity greater on the right likely suggesting respiratory bronchiolitis. Musculoskeletal: No significant bony findings. Review of the MIP images confirms the above findings. CT ABDOMEN and PELVIS FINDINGS Hepatobiliary: Mild periportal edema and a small amount fluid around the gallbladder likely a postsurgical change. No hepatic lesions or intrahepatic biliary dilatation. The portal and hepatic veins are patent. Pancreas: Normal Spleen: Normal Adrenals/Urinary Tract: Normal Stomach/Bowel: The stomach and duodenum are unremarkable. The proximal and mid small bowel loops are normal in caliber. Slight dilatation of the more distal small bowel loops but no findings for recurrent obstruction. Contrast extends all the way into the colon. Surgical changes at the ileocolonic anastomosis. Expected inflammations/edema and scattered surrounding fluid but no findings suspicious for a postoperative abscess. No leaking oral contrast is identified. One small dot of air near the anastomosis is not unexpected. Vascular/Lymphatic: The aorta  and branch vessels are patent. The major venous structures are patent. Stable IVC filter. No mesenteric or retroperitoneal mass or adenopathy. Reproductive: The uterus and ovaries are unremarkable. Other: Small amount of free abdominal and free pelvic fluid, not unexpected after surgery. No hematoma or rim enhancing abscess. Musculoskeletal: No significant bony findings. Review of the MIP images confirms the above findings. IMPRESSION: 1. No CT findings for pulmonary embolism. 2. Small bilateral pleural effusions, right larger than left with areas of overlying subsegmental atelectasis. 3. Underlying emphysematous changes and pulmonary scarring. 4. Centrilobular upper lobe nodularity greater on the right likely suggesting respiratory bronchiolitis. 5. Surgical changes at the ileocolonic anastomosis. Expected inflammation/edema and scattered surrounding fluid but no findings suspicious for a postoperative abscess. No leaking oral contrast is identified. 6. Small amount of free abdominal and free pelvic fluid, not unexpected after surgery. 7. Aortic atherosclerosis. Aortic Atherosclerosis (ICD10-I70.0) and Emphysema (ICD10-J43.9). But Electronically Signed   By: Rudie Meyer M.D.   On: 02/01/2023 12:09   DG CHEST PORT 1 VIEW  Result Date: 02/01/2023 CLINICAL DATA:  Wheezing. EXAM: PORTABLE CHEST 1 VIEW COMPARISON:  January 31, 2023. FINDINGS: Stable cardiomediastinal silhouette. Left-sided PICC line is noted with tip in expected position of cavoatrial junction. Minimal right basilar subsegmental atelectasis is noted. Stable left midlung scarring or atelectasis is noted with associated pleural thickening. Stable left basilar atelectasis or scarring is noted with small left pleural effusion or scarring. Bony thorax is unremarkable. IMPRESSION: Stable findings seen involving left lung. Minimal right basilar subsegmental atelectasis is noted. Electronically Signed   By: Lupita Raider M.D.   On: 02/01/2023 11:46   CT  HEAD WO CONTRAST ( )  Result Date: 01/31/2023 CLINICAL DATA:  Altered mental status EXAM: CT HEAD WITHOUT CONTRAST TECHNIQUE: Contiguous axial images were obtained from the base of the skull through the vertex without intravenous contrast. RADIATION DOSE REDUCTION: This exam was performed according to the departmental dose-optimization program which includes automated exposure control, adjustment of the mA and/or kV according to patient size and/or use of iterative reconstruction technique. COMPARISON:  None Available. FINDINGS: Brain: There is no mass, hemorrhage or extra-axial collection. There is generalized atrophy without lobar predilection. Hypodensity of the white matter is most commonly associated with chronic microvascular disease. There are multiple old bilateral cortical infarcts. Vascular: No abnormal hyperdensity of the major intracranial  arteries or dural venous sinuses. No intracranial atherosclerosis. Skull: The visualized skull base, calvarium and extracranial soft tissues are normal. Sinuses/Orbits: No fluid levels or advanced mucosal thickening of the visualized paranasal sinuses. No mastoid or middle ear effusion. The orbits are normal. IMPRESSION: 1. No acute intracranial abnormality. 2. Multiple old bilateral cortical infarcts. 3. Generalized atrophy and chronic microvascular disease. Electronically Signed   By: Deatra Robinson M.D.   On: 01/31/2023 19:31   DG CHEST PORT 1 VIEW  Result Date: 01/31/2023 CLINICAL DATA:  Cough EXAM: PORTABLE CHEST 1 VIEW COMPARISON:  X-ray 01/24/2023 FINDINGS: Left-sided PICC catheter with tip overlying the upper right atrium. Increasing hazy left midlung opacity with a small left effusion or pleural thickening. Right lung has some basilar atelectasis. No pneumothorax. No separate consolidation in the right lung. Stable cardiopericardial silhouette. IMPRESSION: Left-sided PICC. Increasing left midlung opacity.  Right basilar atelectasis. Tiny left effusion  versus pleural thickening Electronically Signed   By: Karen Kays M.D.   On: 01/31/2023 17:08   DG Abd 1 View  Result Date: 01/29/2023 CLINICAL DATA:  NG tube placement. Small bowel obstruction status post exploratory laparotomy and bowel resection today. EXAM: ABDOMEN - 1 VIEW COMPARISON:  Abdominal radiograph 01/27/2023 FINDINGS: An enteric tube has been placed and terminates in the expected region of the gastric body with side port also below the diaphragm. New intraperitoneal free air is consistent with recent surgery. Small bowel dilatation included portion of the abdomen is decreased from prior study. An IVC filter and skin staples are noted. There are small left and possible trace right pleural effusions. IMPRESSION: 1. Enteric tube in the stomach. 2. Pneumoperitoneum consistent with recent surgery. Electronically Signed   By: Sebastian Ache M.D.   On: 01/29/2023 16:25   [place] EKG SITE RITE  Result Date: 01/28/2023 If Site Rite image not attached, placement could not be confirmed due to current cardiac rhythm.  DG Abd Portable 1V  Result Date: 01/27/2023 CLINICAL DATA:  Small-bowel obstruction EXAM: PORTABLE ABDOMEN - 1 VIEW COMPARISON:  CT abdomen pelvis 01/25/2023 FINDINGS: Redemonstrated markedly gaseous distended loops small bowel within the central abdomen measuring up to 5 cm. No definite free intraperitoneal air. Oral contrast material within the colon. IVC filter. Lumbar spine degenerative changes. Left nephrolithiasis. IMPRESSION: Marked gaseous distended loops of small bowel within the central abdomen measuring up to 5 cm, compatible with small-bowel obstruction. Electronically Signed   By: Annia Belt M.D.   On: 01/27/2023 08:16   CT ABDOMEN PELVIS W CONTRAST  Result Date: 01/25/2023 CLINICAL DATA:  Crohn's exacerbation. Small bowel obstruction seen on abdominal series. EXAM: CT ABDOMEN AND PELVIS WITH CONTRAST TECHNIQUE: Multidetector CT imaging of the abdomen and pelvis was performed  using the standard protocol following bolus administration of intravenous contrast. RADIATION DOSE REDUCTION: This exam was performed according to the departmental dose-optimization program which includes automated exposure control, adjustment of the mA and/or kV according to patient size and/or use of iterative reconstruction technique. CONTRAST:  OMNIPAQUE IOHEXOL 300 MG/ML  SOLN COMPARISON:  Plain films 01/24/2023 FINDINGS: Lower chest: No acute abnormality. Hepatobiliary: Diffuse low-density throughout the liver compatible with fatty infiltration. No focal abnormality. Gallbladder unremarkable. Pancreas: No focal abnormality or ductal dilatation. Spleen: No focal abnormality.  Normal size. Adrenals/Urinary Tract: No adrenal abnormality. No focal renal abnormality. No stones or hydronephrosis. Urinary bladder is unremarkable. Stomach/Bowel: Postoperative changes in the right lower quadrant. Markedly dilated small bowel into the pelvis. Distal small bowel is decompressed and grossly unremarkable.  No evidence for active Crohn's disease. Findings compatible with distal high-grade small bowel obstruction, possibly related to stricture or adhesions. Stomach unremarkable. Vascular/Lymphatic: Aortic atherosclerosis. No evidence of aneurysm or adenopathy. IVC filter in place. Reproductive: Uterus and adnexa unremarkable.  No mass. Other: No free fluid or free air. Musculoskeletal: No acute bony abnormality. IMPRESSION: High-grade distal small-bowel obstruction. Distal ileum is decompressed and grossly unremarkable. No evidence for active Crohn's disease. Obstruction may be related to stricture or adhesions. Fatty liver. Aortic atherosclerosis. Electronically Signed   By: Charlett Nose M.D.   On: 01/25/2023 00:29   DG ABD ACUTE 2+V W 1V CHEST  Result Date: 01/24/2023 CLINICAL DATA:  Crohn disease, recent obstruction. Abdominal pain and vomiting. EXAM: DG ABDOMEN ACUTE WITH 1 VIEW CHEST COMPARISON:  06/13/2010.  FINDINGS: Frontal view of the chest shows midline trachea and normal heart size. Pleuroparenchymal scarring in the left hemithorax, new from 06/13/2010. Lungs are otherwise clear. Markedly distended loops of small bowel in the upper abdomen with associated air-fluid levels. No colonic gas. Sutures are seen in the right lower quadrant. IVC filter in place. No free air. IMPRESSION: 1. High-grade small bowel obstruction.  No free air. 2. Pleuroparenchymal scarring in the left hemithorax. Electronically Signed   By: Leanna Battles M.D.   On: 01/24/2023 13:01     Subjective: - no chest pain, shortness of breath, no abdominal pain, nausea or vomiting.   Discharge Exam: BP (!) 164/97 (BP Location: Right Arm)   Pulse 71   Temp 99.1 F (37.3 C) (Oral)   Resp 18   Ht 5\' 2"  (1.575 m)   LMP  (LMP Unknown) Comment: Post Menopausal  SpO2 94%   BMI 25.77 kg/m   General: Pt is alert, awake, not in acute distress Cardiovascular: RRR, S1/S2 +, no rubs, no gallops Respiratory: CTA bilaterally, no wheezing, no rhonchi Abdominal: Soft, NT, ND, bowel sounds + Extremities: no edema, no cyanosis    The results of significant diagnostics from this hospitalization (including imaging, microbiology, ancillary and laboratory) are listed below for reference.     Microbiology: No results found for this or any previous visit (from the past 240 hour(s)).   Labs: Basic Metabolic Panel: Recent Labs  Lab 02/09/23 0445 02/10/23 0604 02/10/23 2841 02/11/23 0548 02/11/23 0911 02/12/23 0418 02/13/23 0521 02/14/23 0505  NA 131* 120*   < > 126* 128* 129* 129* 130*  K 4.5 3.9  --   --  2.7* 3.8 3.4* 3.8  CL 102 87*  --   --  93* 99 97* 98  CO2 20* 19*  --   --  23 20* 21* 20*  GLUCOSE 105* 118*  --   --  102* 99 93 104*  BUN 7 <5*  --   --  <5* <5* <5* <5*  CREATININE 0.61 0.51  --   --  0.49 0.46 0.54 0.44  CALCIUM 7.8* 8.1*  --   --  8.0* 8.2* 8.6* 8.6*  MG 1.5* 1.6*  --   --   --   --  1.5* 1.7   < > =  values in this interval not displayed.   Liver Function Tests: Recent Labs  Lab 02/09/23 0445  AST 24  ALT 33  ALKPHOS 103  BILITOT 1.1  PROT 6.2*  ALBUMIN 2.4*   CBC: Recent Labs  Lab 02/10/23 0958 02/11/23 0911 02/12/23 0418 02/13/23 0521 02/14/23 0505  WBC 13.0* 13.2* 14.1* 13.0* 12.0*  HGB 10.9* 11.6* 11.3* 12.3 11.6*  HCT 32.9* 35.3* 35.2* 37.6 39.8  MCV 83.3 82.9 85.2 84.1 94.5  PLT 576* 676* 695* 637* 608*   CBG: Recent Labs  Lab 02/15/23 0345 02/15/23 0723 02/15/23 0801 02/15/23 0831 02/15/23 1121  GLUCAP 75 69* 57* 94 132*   Hgb A1c No results for input(s): "HGBA1C" in the last 72 hours. Lipid Profile No results for input(s): "CHOL", "HDL", "LDLCALC", "TRIG", "CHOLHDL", "LDLDIRECT" in the last 72 hours. Thyroid function studies No results for input(s): "TSH", "T4TOTAL", "T3FREE", "THYROIDAB" in the last 72 hours.  Invalid input(s): "FREET3" Urinalysis    Component Value Date/Time   COLORURINE YELLOW 02/08/2023 0532   APPEARANCEUR CLEAR 02/08/2023 0532   LABSPEC 1.009 02/08/2023 0532   PHURINE 8.0 02/08/2023 0532   GLUCOSEU NEGATIVE 02/08/2023 0532   HGBUR NEGATIVE 02/08/2023 0532   BILIRUBINUR NEGATIVE 02/08/2023 0532   KETONESUR 5 (A) 02/08/2023 0532   PROTEINUR 30 (A) 02/08/2023 0532   UROBILINOGEN 1.0 06/13/2010 1845   NITRITE NEGATIVE 02/08/2023 0532   LEUKOCYTESUR NEGATIVE 02/08/2023 0532    FURTHER DISCHARGE INSTRUCTIONS:   Get Medicines reviewed and adjusted: Please take all your medications with you for your next visit with your Primary MD   Laboratory/radiological data: Please request your Primary MD to go over all hospital tests and procedure/radiological results at the follow up, please ask your Primary MD to get all Hospital records sent to his/her office.   In some cases, they will be blood work, cultures and biopsy results pending at the time of your discharge. Please request that your primary care M.D. goes through all the  records of your hospital data and follows up on these results.   Also Note the following: If you experience worsening of your admission symptoms, develop shortness of breath, life threatening emergency, suicidal or homicidal thoughts you must seek medical attention immediately by calling 911 or calling your MD immediately  if symptoms less severe.   You must read complete instructions/literature along with all the possible adverse reactions/side effects for all the Medicines you take and that have been prescribed to you. Take any new Medicines after you have completely understood and accpet all the possible adverse reactions/side effects.    Do not drive when taking Pain medications or sleeping medications (Benzodaizepines)   Do not take more than prescribed Pain, Sleep and Anxiety Medications. It is not advisable to combine anxiety,sleep and pain medications without talking with your primary care practitioner   Special Instructions: If you have smoked or chewed Tobacco  in the last 2 yrs please stop smoking, stop any regular Alcohol  and or any Recreational drug use.   Wear Seat belts while driving.   Please note: You were cared for by a hospitalist during your hospital stay. Once you are discharged, your primary care physician will handle any further medical issues. Please note that NO REFILLS for any discharge medications will be authorized once you are discharged, as it is imperative that you return to your primary care physician (or establish a relationship with a primary care physician if you do not have one) for your post hospital discharge needs so that they can reassess your need for medications and monitor your lab values.  Time coordinating discharge: 35 minutes  SIGNED:  Pamella Pert, MD, PhD 02/15/2023, 12:25 PM

## 2023-02-15 NOTE — Plan of Care (Signed)
  Problem: Education: Goal: Knowledge of General Education information will improve Description: Including pain rating scale, medication(s)/side effects and non-pharmacologic comfort measures Outcome: Adequate for Discharge   Problem: Health Behavior/Discharge Planning: Goal: Ability to manage health-related needs will improve Outcome: Adequate for Discharge   Problem: Clinical Measurements: Goal: Ability to maintain clinical measurements within normal limits will improve Outcome: Adequate for Discharge Goal: Will remain free from infection Outcome: Adequate for Discharge Goal: Diagnostic test results will improve Outcome: Adequate for Discharge Goal: Respiratory complications will improve Outcome: Adequate for Discharge Goal: Cardiovascular complication will be avoided Outcome: Adequate for Discharge   Problem: Activity: Goal: Risk for activity intolerance will decrease Outcome: Adequate for Discharge   Problem: Nutrition: Goal: Adequate nutrition will be maintained Outcome: Adequate for Discharge   Problem: Coping: Goal: Level of anxiety will decrease Outcome: Adequate for Discharge   Problem: Elimination: Goal: Will not experience complications related to bowel motility Outcome: Adequate for Discharge   Problem: Pain Managment: Goal: General experience of comfort will improve Outcome: Adequate for Discharge   Problem: Safety: Goal: Ability to remain free from injury will improve Outcome: Adequate for Discharge   Problem: Skin Integrity: Goal: Risk for impaired skin integrity will decrease Outcome: Adequate for Discharge   Problem: Malnutrition  (NI-5.2) Goal: Food and/or nutrient delivery Description: Individualized approach for food/nutrient provision. Outcome: Adequate for Discharge   Problem: Acute Rehab PT Goals(only PT should resolve) Goal: Pt Will Go Supine/Side To Sit Outcome: Adequate for Discharge Goal: Patient Will Transfer Sit To/From  Stand Outcome: Adequate for Discharge Goal: Pt Will Ambulate Outcome: Adequate for Discharge   Problem: SLP Dysphagia Goals Goal: Misc Dysphagia Goal Outcome: Adequate for Discharge   Problem: Acute Rehab OT Goals (only OT should resolve) Goal: Pt. Will Perform Grooming Outcome: Adequate for Discharge Goal: Pt. Will Perform Lower Body Dressing Outcome: Adequate for Discharge Goal: Pt. Will Transfer To Toilet Outcome: Adequate for Discharge

## 2023-02-15 NOTE — Progress Notes (Signed)
Hypoglycemic Event  CBG: 69 @0725   Treatment: 4 oz juice/soda  Symptoms: None  Follow-up CBG: Time:0811 CBG Result: 57 Follow up CBG  Time> 0831   CBG Results 94  Possible Reasons for Event: Inadequate meal intake  Comments/MD notified: Dr Elvera Lennox aware     Sherian Maroon

## 2023-02-15 NOTE — Progress Notes (Signed)
Assessment unchanged. Pt aware going to [facility]. [facility] here to transport to facility. Medicated for pain recently. VSS. Report called and and given Jody, LPN @[facility] @1345 . Left unit via stretcher to meet ambulance to carry to facility.

## 2023-02-16 LAB — VITAMIN B1: Vitamin B1 (Thiamine): 168.9 nmol/L (ref 66.5–200.0)

## 2023-07-22 ENCOUNTER — Other Ambulatory Visit: Payer: Self-pay

## 2023-07-22 ENCOUNTER — Emergency Department (HOSPITAL_COMMUNITY)
Admission: EM | Admit: 2023-07-22 | Discharge: 2023-07-22 | Disposition: A | Discharge status: Home / Self Care | Payer: Medicaid Other | Attending: Emergency Medicine | Admitting: Emergency Medicine

## 2023-07-22 ENCOUNTER — Emergency Department (HOSPITAL_COMMUNITY): Payer: Medicaid Other

## 2023-07-22 DIAGNOSIS — R000 Tachycardia, unspecified: Secondary | ICD-10-CM | POA: Diagnosis not present

## 2023-07-22 DIAGNOSIS — Z7901 Long term (current) use of anticoagulants: Secondary | ICD-10-CM | POA: Diagnosis not present

## 2023-07-22 DIAGNOSIS — R0789 Other chest pain: Secondary | ICD-10-CM

## 2023-07-22 DIAGNOSIS — J180 Bronchopneumonia, unspecified organism: Secondary | ICD-10-CM | POA: Insufficient documentation

## 2023-07-22 DIAGNOSIS — F17200 Nicotine dependence, unspecified, uncomplicated: Secondary | ICD-10-CM | POA: Diagnosis not present

## 2023-07-22 DIAGNOSIS — M791 Myalgia, unspecified site: Secondary | ICD-10-CM | POA: Diagnosis not present

## 2023-07-22 LAB — BASIC METABOLIC PANEL
Anion gap: 9 (ref 5–15)
BUN: 9 mg/dL (ref 6–20)
CO2: 22 mmol/L (ref 22–32)
Calcium: 8.7 mg/dL — ABNORMAL LOW (ref 8.9–10.3)
Chloride: 102 mmol/L (ref 98–111)
Creatinine, Ser: 0.61 mg/dL (ref 0.44–1.00)
GFR, Estimated: >60 mL/min (ref >60)
Glucose, Bld: 100 mg/dL — ABNORMAL HIGH (ref 70–99)
Potassium: 3.6 mmol/L (ref 3.5–5.1)
Sodium: 133 mmol/L — ABNORMAL LOW (ref 135–145)

## 2023-07-22 LAB — CBC
HCT: 36.6 % (ref 36.0–46.0)
Hemoglobin: 11.9 g/dL — ABNORMAL LOW (ref 12.0–15.0)
MCH: 29.5 pg (ref 26.0–34.0)
MCHC: 32.5 g/dL (ref 30.0–36.0)
MCV: 90.6 fL (ref 80.0–100.0)
Platelets: 159 10*3/uL (ref 150–400)
RBC: 4.04 MIL/uL (ref 3.87–5.11)
RDW: 14.9 % (ref 11.5–15.5)
WBC: 8.7 10*3/uL (ref 4.0–10.5)
nRBC: 0 % (ref 0.0–0.2)

## 2023-07-22 LAB — LIPASE, BLOOD: Lipase: 25 U/L (ref 11–51)

## 2023-07-22 LAB — HEPATIC FUNCTION PANEL
ALT: 14 U/L (ref 0–44)
AST: 22 U/L (ref 15–41)
Albumin: 4.2 g/dL (ref 3.5–5.0)
Alkaline Phosphatase: 82 U/L (ref 38–126)
Bilirubin, Direct: 0.2 mg/dL (ref 0.0–0.2)
Indirect Bilirubin: 1.2 mg/dL — ABNORMAL HIGH (ref 0.3–0.9)
Total Bilirubin: 1.4 mg/dL — ABNORMAL HIGH (ref <1.2)
Total Protein: 8.7 g/dL — ABNORMAL HIGH (ref 6.5–8.1)

## 2023-07-22 LAB — TROPONIN I (HIGH SENSITIVITY)
Troponin I (High Sensitivity): 6 ng/L (ref <18)
Troponin I (High Sensitivity): 5 ng/L (ref <18)

## 2023-07-22 MED ORDER — ALBUTEROL SULFATE HFA 108 (90 BASE) MCG/ACT IN AERS
2.0000 | INHALATION_SPRAY | Freq: Once | RESPIRATORY_TRACT | Status: AC
  Administered 2023-07-22: 2 via RESPIRATORY_TRACT
  Filled 2023-07-22: qty 6.7

## 2023-07-22 MED ORDER — OXYCODONE-ACETAMINOPHEN 5-325 MG PO TABS
1.0000 | ORAL_TABLET | Freq: Once | ORAL | Status: AC
  Administered 2023-07-22: 1 via ORAL
  Filled 2023-07-22: qty 1

## 2023-07-22 MED ORDER — IPRATROPIUM-ALBUTEROL 0.5-2.5 (3) MG/3ML IN SOLN
3.0000 mL | Freq: Once | RESPIRATORY_TRACT | Status: AC
  Administered 2023-07-22: 3 mL via RESPIRATORY_TRACT
  Filled 2023-07-22: qty 3

## 2023-07-22 MED ORDER — METHOCARBAMOL 500 MG PO TABS: 500.0000 mg | ORAL_TABLET | Freq: Three times a day (TID) | ORAL | 0 refills | Status: AC | PRN

## 2023-07-22 MED ORDER — DOXYCYCLINE HYCLATE 100 MG PO TABS
100.0000 mg | ORAL_TABLET | Freq: Once | ORAL | Status: AC
  Administered 2023-07-22: 100 mg via ORAL
  Filled 2023-07-22: qty 1

## 2023-07-22 MED ORDER — PREDNISONE 20 MG PO TABS: 20.0000 mg | ORAL_TABLET | Freq: Every day | ORAL | 0 refills | Status: AC

## 2023-07-22 MED ORDER — DOXYCYCLINE HYCLATE 100 MG PO CAPS: 100.0000 mg | ORAL_CAPSULE | Freq: Two times a day (BID) | ORAL | 0 refills | Status: AC

## 2023-07-22 NOTE — ED Provider Notes (Signed)
Emergency Department Provider Note   I have reviewed the triage vital signs and the nursing notes.   HISTORY  Chief Complaint Chest Pain and Cough   HPI Tara Berger is a 52 y.o. female presents to the emergency department valuation of cough, congestion, chest discomfort.  Patient describes chest pain only with coughing.  It feels like tightness.  No symptoms at rest or with exertion.  She has nasal congestion along with frequent, forceful coughing.  No known fevers.  Has had some chills and bodyaches.  No abdominal pain, vomiting, diarrhea.  Patient is anticoagulated and reports being compliant with her medication.  No past medical history on file.  Review of Systems  Constitutional: No fever. Positive fatigue.  Cardiovascular: Positive chest pain. Respiratory: Positive shortness of breath. Gastrointestinal: No abdominal pain.  No nausea, no vomiting.   Musculoskeletal: Negative for back pain. Skin: Negative for rash. Neurological: Negative for headaches.  ____________________________________________   PHYSICAL EXAM:  VITAL SIGNS: ED Triage Vitals  Encounter Vitals Group     BP 07/22/23 0803 (!) 154/109     Pulse Rate 07/22/23 0803 (!) 127     Resp 07/22/23 0803 (!) 22     Temp 07/22/23 0803 98.8 F (37.1 C)     Temp Source 07/22/23 0803 Oral     SpO2 07/22/23 0801 95 %     Weight 07/22/23 0801 139 lb (63 kg)     Height 07/22/23 0801 5\' 2"  (1.575 m)   Constitutional: Alert and oriented. Well appearing and in no acute distress. Eyes: Conjunctivae are normal.  Head: Atraumatic. Nose: No congestion/rhinnorhea. Mouth/Throat: Mucous membranes are moist.   Neck: No stridor.   Cardiovascular: Tachycardia. Good peripheral circulation. Grossly normal heart sounds.   Respiratory: Normal respiratory effort.  No retractions. Lungs with bilateral end-expiratory wheezing. Patient with frequent cough.  Gastrointestinal: Soft and nontender. No distention.   Musculoskeletal: No lower extremity tenderness nor edema. No gross deformities of extremities. Neurologic:  Normal speech and language.  Skin:  Skin is warm, dry and intact. No rash noted.  ____________________________________________   LABS (all labs ordered are listed, but only abnormal results are displayed)  Labs Reviewed  BASIC METABOLIC PANEL  CBC  HEPATIC FUNCTION PANEL  LIPASE, BLOOD  TROPONIN I (HIGH SENSITIVITY)   ____________________________________________  EKG   EKG Interpretation Date/Time:  Sunday July 22 2023 07:58:13 EST Ventricular Rate:  135 PR Interval:  79 QRS Duration:  83 QT Interval:  313 QTC Calculation: 470 R Axis:   51  Text Interpretation: Sinus tachycardia Left atrial enlargement Borderline repolarization abnormality Confirmed by Alona Bene [phone]) on 07/22/2023 8:10:52 AM        ____________________________________________  RADIOLOGY  No results found.  ____________________________________________   PROCEDURES  Procedure(s) performed:   Procedures   ____________________________________________   INITIAL IMPRESSION / ASSESSMENT AND PLAN / ED COURSE  Pertinent labs & imaging results that were available during my care of the patient were reviewed by me and considered in my medical decision making (see chart for details).   This patient is Presenting for Evaluation of CP, which does require a range of treatment options, and is a complaint that involves a high risk of morbidity and mortality.  The Differential Diagnoses includes but is not exclusive to acute coronary syndrome, aortic dissection, pulmonary embolism, cardiac tamponade, community-acquired pneumonia, pericarditis, musculoskeletal chest wall pain, etc.   Critical Interventions-    Medications  ipratropium-albuterol (DUONEB) 0.5-2.5 (3) MG/3ML nebulizer solution 3 mL (has no  administration in time range)    Reassessment after intervention:     I ***  Additional Historical Information from ***, as the patient is ***.  I decided to review pertinent External Data, and in summary last admit was July of this year with [facility].   Clinical Laboratory Tests Ordered, included   Radiologic Tests Ordered, included CXR. I independently interpreted the images and agree with radiology interpretation.   Cardiac Monitor Tracing which shows sinus tachycardia.    Social Determinants of Health Risk patient is a smoker.   Consult complete with  Medical Decision Making: Summary:  Presents emergency department with chest pain, cough, congestion.  Seems most likely bronchitis/pneumonia.  Low suspicion for PE as she is compliant with her anticoagulation.  She does arrive with tachycardia.  Chest pain only with coughing.  Plan for chest x-ray, screening blood work, reassess after DuoNeb given wheezing on exam. Does not appear acutely volume overloaded.   Reevaluation with update and discussion with   ***Considered admission***  Patient's presentation is most consistent with acute presentation with potential threat to life or bodily function.   Disposition:   ____________________________________________  FINAL CLINICAL IMPRESSION(S) / ED DIAGNOSES  Final diagnoses:  None     NEW OUTPATIENT MEDICATIONS STARTED DURING THIS VISIT:  New Prescriptions   No medications on file    Note:  This document was prepared using Dragon voice recognition software and may include unintentional dictation errors.  Alona Bene, MD, [facility]

## 2023-07-22 NOTE — ED Triage Notes (Signed)
Pt arrived via POV. C/o central intermittent chest pain since last night, and a cough/SOB for several days.    AOx4

## 2023-07-22 NOTE — Discharge Instructions (Signed)
Please take the medication as prescribed. Follow with your PCP in the coming week. Return to the ED with any new or suddenly worsening symptoms.

## 2024-08-01 ENCOUNTER — Other Ambulatory Visit: Payer: Self-pay | Admitting: Physician Assistant

## 2024-08-01 DIAGNOSIS — Z1231 Encounter for screening mammogram for malignant neoplasm of breast: Secondary | ICD-10-CM

## 2024-08-28 ENCOUNTER — Ambulatory Visit

## 2024-09-12 ENCOUNTER — Ambulatory Visit

## 2024-11-11 ENCOUNTER — Ambulatory Visit: Admitting: Neurology
